# Patient Record
Sex: Female | Born: 1980 | Race: White | Hispanic: No | Marital: Married | Smoking: Never smoker
Health system: Southern US, Community
[De-identification: ages and names within clinical notes are randomized; demographics above are authoritative.]

## PROBLEM LIST (undated history)

## (undated) ENCOUNTER — Inpatient Hospital Stay (HOSPITAL_COMMUNITY): Payer: Self-pay

## (undated) DIAGNOSIS — B999 Unspecified infectious disease: Secondary | ICD-10-CM

## (undated) DIAGNOSIS — Z8619 Personal history of other infectious and parasitic diseases: Secondary | ICD-10-CM

## (undated) DIAGNOSIS — T8859XA Other complications of anesthesia, initial encounter: Secondary | ICD-10-CM

## (undated) DIAGNOSIS — G43909 Migraine, unspecified, not intractable, without status migrainosus: Secondary | ICD-10-CM

## (undated) DIAGNOSIS — T4145XA Adverse effect of unspecified anesthetic, initial encounter: Secondary | ICD-10-CM

## (undated) DIAGNOSIS — B379 Candidiasis, unspecified: Secondary | ICD-10-CM

## (undated) HISTORY — DX: Personal history of other infectious and parasitic diseases: Z86.19

## (undated) HISTORY — PX: WISDOM TOOTH EXTRACTION: SHX21

## (undated) HISTORY — DX: Unspecified infectious disease: B99.9

## (undated) HISTORY — PX: INTRAUTERINE DEVICE INSERTION: SHX323

## (undated) HISTORY — DX: Other complications of anesthesia, initial encounter: T88.59XA

## (undated) HISTORY — PX: TONSILLECTOMY: SUR1361

## (undated) HISTORY — DX: Adverse effect of unspecified anesthetic, initial encounter: T41.45XA

## (undated) HISTORY — DX: Candidiasis, unspecified: B37.9

---

## 2011-12-13 ENCOUNTER — Encounter (HOSPITAL_COMMUNITY): Payer: Self-pay | Admitting: *Deleted

## 2011-12-13 ENCOUNTER — Inpatient Hospital Stay (HOSPITAL_COMMUNITY)
Admission: AD | Admit: 2011-12-13 | Discharge: 2011-12-13 | Disposition: A | Discharge status: Home / Self Care | Payer: Medicaid Other | Source: Ambulatory Visit | Attending: Obstetrics & Gynecology | Admitting: Obstetrics & Gynecology

## 2011-12-13 DIAGNOSIS — M549 Dorsalgia, unspecified: Secondary | ICD-10-CM

## 2011-12-13 DIAGNOSIS — O26899 Other specified pregnancy related conditions, unspecified trimester: Secondary | ICD-10-CM

## 2011-12-13 DIAGNOSIS — M5380 Other specified dorsopathies, site unspecified: Secondary | ICD-10-CM | POA: Insufficient documentation

## 2011-12-13 DIAGNOSIS — O99891 Other specified diseases and conditions complicating pregnancy: Secondary | ICD-10-CM | POA: Insufficient documentation

## 2011-12-13 HISTORY — DX: Migraine, unspecified, not intractable, without status migrainosus: G43.909

## 2011-12-13 LAB — URINE MICROSCOPIC-ADD ON

## 2011-12-13 LAB — URINALYSIS, ROUTINE W REFLEX MICROSCOPIC
Bilirubin Urine: NEGATIVE
Glucose, UA: NEGATIVE mg/dL
Ketones, ur: NEGATIVE mg/dL
Nitrite: NEGATIVE
Specific Gravity, Urine: 1.025 (ref 1.005–1.030)
pH: 6.5 (ref 5.0–8.0)

## 2011-12-13 MED ORDER — ACETAMINOPHEN 500 MG PO TABS
1000.0000 mg | ORAL_TABLET | Freq: Three times a day (TID) | ORAL | Status: DC | PRN
  Administered 2011-12-13: 1000 mg via ORAL
  Filled 2011-12-13: qty 2

## 2011-12-13 MED ORDER — CYCLOBENZAPRINE HCL 10 MG PO TABS
10.0000 mg | ORAL_TABLET | Freq: Once | ORAL | Status: AC
  Administered 2011-12-13: 10 mg via ORAL
  Filled 2011-12-13: qty 1

## 2011-12-13 MED ORDER — CYCLOBENZAPRINE HCL 10 MG PO TABS: 10.0000 mg | ORAL_TABLET | Freq: Three times a day (TID) | ORAL | Status: AC | PRN

## 2011-12-13 NOTE — MAU Note (Signed)
I have severe back pain for a month. I get this with each pregnancy near the end. Went to [facility] and given pain med. And sent home. Has seen chiropractor for couple wks. Can't sleep and have 3 kids and can't take care of them. Called the office this am and was told would call. Office closed at 1400 and pt received call at 1330. Pt has tried everything to help back pain and upset that OB/GYN didn't offer to help her.

## 2011-12-13 NOTE — MAU Note (Signed)
Patient states that she have had lower back pain for 2 months and have been going to the chiropractor but have breakthrough intense pains where she can't walk or move. She states that she went to [facility] last week and was given 2 tablets of percocet but her doctor will not give her any pain medication or muscle relaxer. She denies any vaginal bleeding, lof or discharge. She reports good fetal movement. She states that the pregnancy belt isn't working for her. She states that she have 3 children and wants to able to care for them.

## 2011-12-13 NOTE — MAU Provider Note (Signed)
  History     CSN: [id number]  Arrival date and time: 12/13/11 2043   First Provider Initiated Contact with Patient 12/13/11 2141      Chief Complaint  Patient presents with  . Back Pain   HPI This is a 31 yo [id number] at 32.3 weeks who receives her care with Dr Shawnie Pons in [facility] who presents with 2 months of severe nonradiating constant back pain.  Has been worse over past several days.  Sees chiropractor over past 2 weeks, which has helped some.  Tylenol not helpful.  Movement makes pain worse.  Denies fevers, chills, nausea, vomiting, discharge, bleeding, contractions, decreased fetal movement.  OB History    Grav Para Term Preterm Abortions TAB SAB Ect Mult Living   8    4  4   3       Past Medical History  Diagnosis Date  . Migraine     Past Surgical History  Procedure Date  . Cesarean section     History reviewed. No pertinent family history.  History  Substance Use Topics  . Smoking status: Not on file  . Smokeless tobacco: Not on file  . Alcohol Use:     Allergies:  Allergies  Allergen Reactions  . Amoxicillin Hives and Itching  . Erythromycin Hives and Itching  . Sulfa Antibiotics Swelling and Rash    Prescriptions prior to admission  Medication Sig Dispense Refill  . calcium carbonate (TUMS - DOSED IN MG ELEMENTAL CALCIUM) 500 MG chewable tablet Chew 1-2 tablets by mouth daily as needed. For heartburn      . Docosahexaenoic Acid (PRENATAL DHA PO) Take 1 tablet by mouth at bedtime.        ROS Physical Exam   Blood pressure 116/60, pulse 100, temperature 98.5 F (36.9 C), temperature source Oral, resp. rate 20, height 5\' 2"  (1.575 m), weight 90.447 kg (199 lb 6.4 oz).  Physical Exam  Constitutional: She is oriented to person, place, and time. She appears well-developed and well-nourished.  GI: Soft. She exhibits no distension and no mass. There is no tenderness. There is no rebound and no guarding.  Musculoskeletal: Normal range of motion. She  exhibits tenderness (worse left lower back.  Hypertonic lumbar paraspinals and QLs.).  Neurological: She is alert and oriented to person, place, and time.  Skin: Skin is warm and dry.  Psychiatric: She has a normal mood and affect. Her behavior is normal. Judgment and thought content normal.    MAU Course  Procedures   Assessment and Plan  1.  Back Spasm in pregnancy  Will prescribe flexeril.  Encouraged continued chiropractic care, tylenol, heat/ice, exercises.  Pt to follow up at next scheduled appt.  Jayin Derousse JEHIEL 12/13/2011, 10:08 PM

## 2011-12-26 ENCOUNTER — Ambulatory Visit (INDEPENDENT_AMBULATORY_CARE_PROVIDER_SITE_OTHER): Payer: Medicaid Other | Admitting: Obstetrics and Gynecology

## 2011-12-26 DIAGNOSIS — Z331 Pregnant state, incidental: Secondary | ICD-10-CM

## 2011-12-26 NOTE — Progress Notes (Signed)
Pt transferring without records. Has appt with Dr Shawnie Pons this PM.  ROI signed. Pt will request records at visit.  States has had labs and U/S. Has not been screened for toxoplasmosis that she knows.   Will await records.  Also ROI signed for C/S reports from [place]. Pt desires VBAC.  To discuss at [place].

## 2011-12-30 ENCOUNTER — Encounter: Payer: Self-pay | Admitting: Obstetrics and Gynecology

## 2012-01-01 ENCOUNTER — Encounter: Payer: Self-pay | Admitting: Obstetrics and Gynecology

## 2014-08-18 ENCOUNTER — Inpatient Hospital Stay (HOSPITAL_COMMUNITY): Admit: 2014-08-18 | Payer: Self-pay

## 2014-08-18 ENCOUNTER — Encounter (HOSPITAL_COMMUNITY): Payer: Self-pay | Admitting: *Deleted

## 2014-08-18 ENCOUNTER — Inpatient Hospital Stay (HOSPITAL_COMMUNITY): Payer: Medicaid Other

## 2014-08-18 ENCOUNTER — Inpatient Hospital Stay (HOSPITAL_COMMUNITY)
Admission: AD | Admit: 2014-08-18 | Discharge: 2014-08-18 | Disposition: A | Discharge status: Home / Self Care | Payer: Self-pay | Source: Ambulatory Visit | Attending: Obstetrics & Gynecology | Admitting: Obstetrics & Gynecology

## 2014-08-18 DIAGNOSIS — Z3A00 Weeks of gestation of pregnancy not specified: Secondary | ICD-10-CM | POA: Insufficient documentation

## 2014-08-18 DIAGNOSIS — R109 Unspecified abdominal pain: Secondary | ICD-10-CM

## 2014-08-18 DIAGNOSIS — O26899 Other specified pregnancy related conditions, unspecified trimester: Secondary | ICD-10-CM

## 2014-08-18 DIAGNOSIS — O219 Vomiting of pregnancy, unspecified: Secondary | ICD-10-CM | POA: Insufficient documentation

## 2014-08-18 LAB — ABO/RH: ABO/RH(D): A POS

## 2014-08-18 LAB — CBC
HEMATOCRIT: 38.3 % (ref 36.0–46.0)
HEMOGLOBIN: 13 g/dL (ref 12.0–15.0)
MCH: 29.4 pg (ref 26.0–34.0)
MCHC: 33.9 g/dL (ref 30.0–36.0)
MCV: 86.7 fL (ref 78.0–100.0)
Platelets: 267 10*3/uL (ref 150–400)
RBC: 4.42 MIL/uL (ref 3.87–5.11)
RDW: 13.2 % (ref 11.5–15.5)
WBC: 9.5 10*3/uL (ref 4.0–10.5)

## 2014-08-18 LAB — URINALYSIS, ROUTINE W REFLEX MICROSCOPIC
Bilirubin Urine: NEGATIVE
GLUCOSE, UA: NEGATIVE mg/dL
Ketones, ur: 15 mg/dL — AB
Leukocytes, UA: NEGATIVE
NITRITE: NEGATIVE
PH: 7 (ref 5.0–8.0)
Protein, ur: NEGATIVE mg/dL
SPECIFIC GRAVITY, URINE: 1.01 (ref 1.005–1.030)
Urobilinogen, UA: 0.2 mg/dL (ref 0.0–1.0)

## 2014-08-18 LAB — COMPREHENSIVE METABOLIC PANEL
ALT: 27 U/L (ref 0–35)
AST: 20 U/L (ref 0–37)
Albumin: 4.3 g/dL (ref 3.5–5.2)
Alkaline Phosphatase: 44 U/L (ref 39–117)
Anion gap: 8 (ref 5–15)
BUN: 10 mg/dL (ref 6–23)
CALCIUM: 8.6 mg/dL (ref 8.4–10.5)
CO2: 24 mmol/L (ref 19–32)
Chloride: 106 mmol/L (ref 96–112)
Creatinine, Ser: 0.72 mg/dL (ref 0.50–1.10)
GFR calc Af Amer: >90 mL/min (ref >90)
GLUCOSE: 149 mg/dL — AB (ref 70–99)
Potassium: 3.9 mmol/L (ref 3.5–5.1)
Sodium: 138 mmol/L (ref 135–145)
Total Bilirubin: 0.6 mg/dL (ref 0.3–1.2)
Total Protein: 6.9 g/dL (ref 6.0–8.3)

## 2014-08-18 LAB — POCT PREGNANCY, URINE: Preg Test, Ur: POSITIVE — AB

## 2014-08-18 LAB — URINE MICROSCOPIC-ADD ON

## 2014-08-18 LAB — WET PREP, GENITAL
Clue Cells Wet Prep HPF POC: NONE SEEN
Trich, Wet Prep: NONE SEEN
WBC, Wet Prep HPF POC: NONE SEEN
YEAST WET PREP: NONE SEEN

## 2014-08-18 LAB — HCG, QUANTITATIVE, PREGNANCY: hCG, Beta Chain, Quant, S: 10343 m[IU]/mL — ABNORMAL HIGH (ref <5)

## 2014-08-18 MED ORDER — DEXTROSE 5 % IN LACTATED RINGERS IV BOLUS
1000.0000 mL | Freq: Once | INTRAVENOUS | Status: AC
  Administered 2014-08-18: 1000 mL via INTRAVENOUS

## 2014-08-18 MED ORDER — PROMETHAZINE HCL 25 MG PO TABS: 25.0000 mg | ORAL_TABLET | Freq: Four times a day (QID) | ORAL | Status: DC | PRN

## 2014-08-18 MED ORDER — PROMETHAZINE HCL 25 MG/ML IJ SOLN
25.0000 mg | Freq: Once | INTRAMUSCULAR | Status: AC
  Administered 2014-08-18: 25 mg via INTRAVENOUS
  Filled 2014-08-18: qty 1

## 2014-08-18 NOTE — MAU Provider Note (Signed)
History     CSN: [id number]  Arrival date and time: 08/18/14 1359   First Provider Initiated Contact with Patient 08/18/14 1448      Chief Complaint  Patient presents with  . Emesis During Pregnancy   HPI Pt is pregnant with unknown GA due to amenorrhea for 1 year after SAB.  Pt has been vomiting for 3 days and unable to  Keep anything down.  Pt denies fever, headache, diarrhea or abdominal pain. Pt denies spotting or bleeding Pt plans to go to [place] for prenatal care.   RN note 1st OB appt on 4/11 @ [place]. Vomiting for the last 3 days, unable to keep fluids down. Has been taking Vitamin B6 but is throwing it up. Denies diarrhea. Has HA, no abd pain.        [facility] appointment); Past Medical History  Diagnosis Date  . Complication of anesthesia     ITCHING  . Infection     UTI WITH PREGNANCY  . Migraine     MIGRAINES  . Yeast infection   . H/O varicella     Past Surgical History  Procedure Laterality Date  . Cesarean section    . Tonsillectomy  AGE 34  . Wisdom tooth extraction  AGE 34    Family History  Problem Relation Age of Onset  . Rheum arthritis Father   . Birth defects Son     HAS ONLY ONE KIDNEY  . Miscarriages / Stillbirths Maternal Grandmother   . Parkinsonism Maternal Grandmother     History  Substance Use Topics  . Smoking status: Never Smoker   . Smokeless tobacco: Never Used  . Alcohol Use: No    Allergies:  Allergies  Allergen Reactions  . Sulfa Antibiotics Swelling and Rash  . Amoxicillin Hives and Itching  . Erythromycin Hives and Itching    Prescriptions prior to admission  Medication Sig Dispense Refill Last Dose  . Docosahexaenoic Acid (PRENATAL DHA PO) Take 1 tablet by mouth at bedtime.   08/17/2014 at Unknown time    Review of Systems  Constitutional: Negative for fever and chills.  Gastrointestinal: Positive for nausea and vomiting. Negative for  heartburn.  Genitourinary: Negative for dysuria, urgency and frequency.  Musculoskeletal: Negative for back pain.  Neurological: Negative for headaches.   Physical Exam   Blood pressure 132/77, pulse 88, temperature 98.4 F (36.9 C), temperature source Oral, resp. rate 18, height  (1.575 m), weight 207 lb 3.2 oz (93.985 kg).  Physical Exam  Nursing note and vitals reviewed. Constitutional: She is oriented to person, place, and time. She appears well-developed and well-nourished. No distress.  HENT:  Head: Normocephalic.  Eyes: Pupils are equal, round, and reactive to light.  Neck: Normal range of motion. Neck supple.  Cardiovascular: Normal rate.   Respiratory: Effort normal.  GI: Soft.  Genitourinary: Vagina normal.  Musculoskeletal: Normal range of motion.  Neurological: She is alert and oriented to person, place, and time.  Skin: Skin is warm and dry.  Psychiatric: She has a normal mood and affect.    MAU Course  Procedures Results for orders placed or performed during the hospital encounter of 08/18/14 (from the past 48 hour(s))  Urinalysis, Routine w reflex microscopic     Status: Abnormal   Collection Time: 08/18/14  2:15 PM  Result Value Ref Range   Color, Urine YELLOW YELLOW   APPearance CLEAR CLEAR   Specific Gravity, Urine 1.010 1.005 -  1.030   pH 7.0 5.0 - 8.0   Glucose, UA NEGATIVE NEGATIVE mg/dL   Hgb urine dipstick TRACE (A) NEGATIVE   Bilirubin Urine NEGATIVE NEGATIVE   Ketones, ur 15 (A) NEGATIVE mg/dL   Protein, ur NEGATIVE NEGATIVE mg/dL   Urobilinogen, UA 0.2 0.0 - 1.0 mg/dL   Nitrite NEGATIVE NEGATIVE   Leukocytes, UA NEGATIVE NEGATIVE  Urine microscopic-add on     Status: None   Collection Time: 08/18/14  2:15 PM  Result Value Ref Range   Squamous Epithelial / LPF RARE RARE   RBC / HPF 0-2 <3 RBC/hpf  Pregnancy, urine POC     Status: Abnormal   Collection Time: 08/18/14  2:22 PM  Result Value Ref Range   Preg Test, Ur POSITIVE (A)  NEGATIVE    Comment:        THE SENSITIVITY OF THIS METHODOLOGY IS >24 mIU/mL   CBC     Status: None   Collection Time: 08/18/14  3:22 PM  Result Value Ref Range   WBC 9.5 4.0 - 10.5 K/uL   RBC 4.42 3.87 - 5.11 MIL/uL   Hemoglobin 13.0 12.0 - 15.0 g/dL   HCT 95.6 21.3 - 08.6 %   MCV 86.7 78.0 - 100.0 fL   MCH 29.4 26.0 - 34.0 pg   MCHC 33.9 30.0 - 36.0 g/dL   RDW 57.8 46.9 - 62.9 %   Platelets 267 150 - 400 K/uL  hCG, quantitative, pregnancy     Status: Abnormal   Collection Time: 08/18/14  3:22 PM  Result Value Ref Range   hCG, Beta Chain, Quant, S 10343 (H) <5 mIU/mL    Comment:          GEST. AGE      CONC.  (mIU/mL)   <=1 WEEK        5 - 50     2 WEEKS       50 - 500     3 WEEKS       100 - 10,000     4 WEEKS     1,000 - 30,000     5 WEEKS     3,500 - 115,000   6-8 WEEKS     12,000 - 270,000    12 WEEKS     15,000 - 220,000        FEMALE AND NON-PREGNANT FEMALE:     LESS THAN 5 mIU/mL   ABO/Rh     Status: None (Preliminary result)   Collection Time: 08/18/14  3:22 PM  Result Value Ref Range   ABO/RH(D) A POS   Wet prep, genital     Status: None   Collection Time: 08/18/14  3:58 PM  Result Value Ref Range   Yeast Wet Prep HPF POC NONE SEEN NONE SEEN   Trich, Wet Prep NONE SEEN NONE SEEN   Clue Cells Wet Prep HPF POC NONE SEEN NONE SEEN   WBC, Wet Prep HPF POC NONE SEEN NONE SEEN  D5LR 1000 and phenergan  IVP GC/chlamdyia pending [place] IUGS no YS, embryo or cardiac activity US Ob Comp Less 14 Wks  08/18/2014   CLINICAL DATA:  Abdominal pain. Spontaneous abortion February 2015, no menstrual periods since that time. Positive pregnancy test. Beta HCG 10,343.  EXAM: OBSTETRIC <14 WK [place] AND TRANSVAGINAL OB US  TECHNIQUE: Both transabdominal and transvaginal ultrasound examinations were performed for complete evaluation of the gestation as well as the maternal uterus, adnexal regions, and pelvic cul-de-sac. Transvaginal technique  was performed to assess early pregnancy.   COMPARISON:  None.  FINDINGS: Intrauterine gestational sac: Small gestational sac.  Yolk sac:  Not present.  Embryo:  Not present  Cardiac Activity: Not present.  MSD:  9 mm   5 w   5  d     US EDC: 04/15/2015  Maternal uterus/adnexae: No subchorionic hemorrhage. The left ovary measures 2.3 x 2.7 x 3.1 cm and contains a probable 2.2 cm corpus luteal cyst. The right ovary measures 3.3 x 2.2 x 2.6 cm. There is normal blood flow to both ovaries. No adnexal mass. Trace pelvic free fluid.  IMPRESSION: Probable early intrauterine gestational sac, but no yolk sac, fetal pole, or cardiac activity yet visualized. This is discordant based on beta HCG. Recommend follow-up quantitative B-HCG levels and follow-up US in 14 days to confirm and assess viability. This recommendation follows SRU consensus guidelines: Diagnostic Criteria for Nonviable Pregnancy Early in the First Trimester. Malva Limes Engl J Med 2013; 161:0960-45; 369:1443-51.   Electronically Signed   By: Rubye OaksMelanie  Ehinger M.D.   On: 08/18/2014 17:19   [place] Ob Transvaginal  08/18/2014   CLINICAL DATA:  Abdominal pain. Spontaneous abortion February 2015, no menstrual periods since that time. Positive pregnancy test. Beta HCG 10,343.  EXAM: OBSTETRIC <14 WK US AND TRANSVAGINAL OB US  TECHNIQUE: Both transabdominal and transvaginal ultrasound examinations were performed for complete evaluation of the gestation as well as the maternal uterus, adnexal regions, and pelvic cul-de-sac. Transvaginal technique was performed to assess early pregnancy.  COMPARISON:  None.  FINDINGS: Intrauterine gestational sac: Small gestational sac.  Yolk sac:  Not present.  Embryo:  Not present  Cardiac Activity: Not present.  MSD:  9 mm   5 w   5  d     US EDC: 04/15/2015  Maternal uterus/adnexae: No subchorionic hemorrhage. The left ovary measures 2.3 x 2.7 x 3.1 cm and contains a probable 2.2 cm corpus luteal cyst. The right ovary measures 3.3 x 2.2 x 2.6 cm. There is normal blood flow to both ovaries. No  adnexal mass. Trace pelvic free fluid.  IMPRESSION: Probable early intrauterine gestational sac, but no yolk sac, fetal pole, or cardiac activity yet visualized. This is discordant based on beta HCG. Recommend follow-up quantitative B-HCG levels and follow-up US in 14 days to confirm and assess viability. This recommendation follows SRU consensus guidelines: Diagnostic Criteria for Nonviable Pregnancy Early in the First Trimester. Malva Limes Engl J Med 2013; 409:8119-14; 369:1443-51.   Electronically Signed   By: Rubye OaksMelanie  Ehinger M.D.   On: 08/18/2014 17:19   Assessment and Plan  Nausea and vomiting in pregnancy Rx Phenergan IUGS, no YS- f/u 48 hours for repeat HCG Heyward Douthit 08/18/2014, 2:51 PM

## 2014-08-18 NOTE — MAU Note (Signed)
[facility] with [facility]/14(1st appointment);

## 2014-08-18 NOTE — MAU Note (Signed)
1st OB appt on 4/11 @ [place].  Vomiting for the last 3 days, unable to keep fluids down.  Has been taking Vitamin B6 but is throwing it up.  Denies diarrhea.  Has HA, no abd pain.

## 2014-08-19 LAB — GC/CHLAMYDIA PROBE AMP (~~LOC~~) NOT AT ARMC
CHLAMYDIA, DNA PROBE: NEGATIVE
Neisseria Gonorrhea: NEGATIVE

## 2014-08-20 ENCOUNTER — Encounter (HOSPITAL_COMMUNITY): Payer: Self-pay

## 2014-08-20 ENCOUNTER — Inpatient Hospital Stay (HOSPITAL_COMMUNITY)
Admission: AD | Admit: 2014-08-20 | Discharge: 2014-08-20 | Disposition: A | Discharge status: Home / Self Care | Payer: Self-pay | Source: Ambulatory Visit | Attending: Obstetrics & Gynecology | Admitting: Obstetrics & Gynecology

## 2014-08-20 DIAGNOSIS — O0281 Inappropriate change in quantitative human chorionic gonadotropin (hCG) in early pregnancy: Secondary | ICD-10-CM | POA: Insufficient documentation

## 2014-08-20 LAB — HCG, QUANTITATIVE, PREGNANCY: HCG, BETA CHAIN, QUANT, S: 16378 m[IU]/mL — AB (ref <5)

## 2014-08-20 NOTE — MAU Note (Signed)
Pt here for repeat BHCG. Denies pain or bleeding.  

## 2014-08-20 NOTE — MAU Provider Note (Signed)
History     CSN: [id number]  Arrival date and time: 08/20/14 9562  Seen by provider at 9:15 am    Chief Complaint  Patient presents with  . Repeat BHCG    HPI Ellen Short 34 y.o. [redacted]w[redacted]d  Comes today for repeat quant. Records and labs from visit on 08-18-14 reviewed.  Denies any pain or bleeding at this time.  Vomiting has not been a problem since the last visit and she thinks the vomiting is due to the pregnancy (not a GI illness) as she had vomiting with her other 4 pregnancies.  Has the prescription for Phenergan.  No other problems today.   OB History    Gravida Para Term Preterm AB TAB SAB Ectopic Multiple Living   Past Medical History  Diagnosis Date  . Complication of anesthesia     ITCHING  . Infection     UTI WITH PREGNANCY  . Migraine     MIGRAINES  . Yeast infection   . H/O varicella     Past Surgical History  Procedure Laterality Date  . Cesarean section    . Tonsillectomy  AGE 5  . Wisdom tooth extraction  AGE 10    Family History  Problem Relation Age of Onset  . Rheum arthritis Father   . Birth defects Son     HAS ONLY ONE KIDNEY  . Miscarriages / Stillbirths Maternal Grandmother   . Parkinsonism Maternal Grandmother     History  Substance Use Topics  . Smoking status: Never Smoker   . Smokeless tobacco: Never Used  . Alcohol Use: No    Allergies:  Allergies  Allergen Reactions  . Sulfa Antibiotics Swelling and Rash  . Amoxicillin Hives and Itching  . Erythromycin Hives and Itching    Prescriptions prior to admission  Medication Sig Dispense Refill Last Dose  . Docosahexaenoic Acid (PRENATAL DHA PO) Take 1 tablet by mouth at bedtime.   08/17/2014 at Unknown time  . promethazine (PHENERGAN) 25 MG tablet Take 1 tablet (25 mg total) by mouth every 6 (six) hours as needed for nausea or vomiting. 30 tablet 0     Review of Systems  Constitutional: Negative for fever.  Gastrointestinal: Positive for nausea and  vomiting. Negative for abdominal pain and diarrhea.  Genitourinary:       No vaginal discharge. No vaginal bleeding. No dysuria.   Physical Exam   Blood pressure 136/76, pulse 83, temperature 99.5 F (37.5 C), temperature source Oral, resp. rate 16, height  (1.575 m), weight 211 lb 8 oz (95.936 kg).  Physical Exam  Nursing note and vitals reviewed. Constitutional: She is oriented to person, place, and time. She appears well-developed and well-nourished.  HENT:  Head: Normocephalic.  Eyes: EOM are normal.  Neck: Neck supple.  Musculoskeletal: Normal range of motion.  Neurological: She is alert and oriented to person, place, and time.  Skin: Skin is warm and dry.  Psychiatric: She has a normal mood and affect.    MAU Course  Procedures Results for orders placed or performed during the hospital encounter of 08/20/14 (from the past 24 hour(s))  hCG, quantitative, pregnancy     Status: Abnormal   Collection Time: 08/20/14  7:36 AM  Result Value Ref Range   hCG, Beta Chain, Quant, S 16378 (H) <5 mIU/mL    MDM Quant was 10,000 on 08-18-14 and would have expected quant of approx.  20,000 today.  Consult with Dr. Macon LargeAnyanwu re: plan of care.  Further quants not needed.  Will order ultrasound to be done 08-25-14 to review status of pregnancy.  Discussed today's lab results with patient.  Advised that quants are rising but possibly not as well as expected.  Discussed possible miscarriage vs. possible normal pregnancy  - will have further information based on the results of the next ultrasound.  Client is tearful - this is a wanted pregnancy and she has had several miscarriages.  Assessment and Plan  Inappropriate rise in quant in early pregnancy  Plan Sent an order to ultrasound to schedule outpatient US on 08-25-14.   Return to MAU with any problems prior to that appointment.   BURLESON,TERRI 08/20/2014, 8:17 AM

## 2014-08-25 ENCOUNTER — Inpatient Hospital Stay (HOSPITAL_COMMUNITY)
Admission: AD | Admit: 2014-08-25 | Discharge: 2014-08-25 | Disposition: A | Discharge status: Home / Self Care | Payer: Self-pay | Source: Ambulatory Visit | Attending: Obstetrics & Gynecology | Admitting: Obstetrics & Gynecology

## 2014-08-25 ENCOUNTER — Ambulatory Visit (HOSPITAL_COMMUNITY)
Admission: RE | Admit: 2014-08-25 | Discharge: 2014-08-25 | Disposition: A | Discharge status: Home / Self Care | Payer: Medicaid Other | Source: Ambulatory Visit | Attending: Nurse Practitioner | Admitting: Nurse Practitioner

## 2014-08-25 DIAGNOSIS — O3680X0 Pregnancy with inconclusive fetal viability, not applicable or unspecified: Secondary | ICD-10-CM

## 2014-08-25 DIAGNOSIS — O0281 Inappropriate change in quantitative human chorionic gonadotropin (hCG) in early pregnancy: Secondary | ICD-10-CM

## 2014-08-25 NOTE — MAU Provider Note (Signed)
Pt is here for viability ultrasound- had repeat HCG at 42 hours and did not quite double Today denies pain or bleeding Pt still has nausea and has used phenergan Generally WDWN female in NAD [place] Ob Comp Less 14 Wks  08/25/2014   CLINICAL DATA:  Inappropriately rising beta HCG levels. First trimester pregnancy with uncertain fetal viability. Unsure of LMP. Advanced maternal age.  EXAM: OBSTETRIC <14 WK US AND TRANSVAGINAL OB US  TECHNIQUE: Both transabdominal and transvaginal ultrasound examinations were performed for complete evaluation of the gestation as well as the maternal uterus, adnexal regions, and pelvic cul-de-sac. Transvaginal technique was performed to assess early pregnancy.  COMPARISON:  08/18/2014  FINDINGS: Intrauterine gestational sac: Visualized/normal in shape.  Yolk sac:  Visualized  Embryo:  Visualized  Cardiac Activity: Visualized  Heart Rate: 122  bpm  CRL:  5  mm   6 w   1 d                  US EDC: 04/16/2015  Maternal uterus/adnexae: Small left ovarian corpus luteum cyst noted. Normal appearance of right ovary. No adnexal mass or free fluid identified.  IMPRESSION: Single living IUP measuring 6 weeks 1 day with US EDC of 04/16/2015.  No significant maternal uterine or adnexal abnormality identified.   Electronically Signed   By: Myles RosenthalJohn  Stahl M.D.   On: 08/25/2014 15:31   [place] Ob Transvaginal  08/25/2014   CLINICAL DATA:  Inappropriately rising beta HCG levels. First trimester pregnancy with uncertain fetal viability. Unsure of LMP. Advanced maternal age.  EXAM: OBSTETRIC <14 WK US AND TRANSVAGINAL OB US  TECHNIQUE: Both transabdominal and transvaginal ultrasound examinations were performed for complete evaluation of the gestation as well as the maternal uterus, adnexal regions, and pelvic cul-de-sac. Transvaginal technique was performed to assess early pregnancy.  COMPARISON:  08/18/2014  FINDINGS: Intrauterine gestational sac: Visualized/normal in shape.  Yolk sac:  Visualized  Embryo:   Visualized  Cardiac Activity: Visualized  Heart Rate: 122  bpm  CRL:  5  mm   6 w   1 d                  US EDC: 04/16/2015  Maternal uterus/adnexae: Small left ovarian corpus luteum cyst noted. Normal appearance of right ovary. No adnexal mass or free fluid identified.  IMPRESSION: Single living IUP measuring 6 weeks 1 day with US EDC of 04/16/2015.  No significant maternal uterine or adnexal abnormality identified.   Electronically Signed   By: Myles RosenthalJohn  Stahl M.D.   On: 08/25/2014 15:31  IMP: SLIUP 6154w1d by US and 4669w5d by LMP Nausea and vomiting- reviewed diet and meds [facility], [facility]

## 2014-08-29 ENCOUNTER — Ambulatory Visit (INDEPENDENT_AMBULATORY_CARE_PROVIDER_SITE_OTHER): Payer: Self-pay

## 2014-08-29 ENCOUNTER — Encounter: Payer: Self-pay | Admitting: Advanced Practice Midwife

## 2014-08-29 ENCOUNTER — Ambulatory Visit (INDEPENDENT_AMBULATORY_CARE_PROVIDER_SITE_OTHER): Payer: Self-pay | Admitting: Advanced Practice Midwife

## 2014-08-29 VITALS — BP 121/80 | HR 88 | Wt 210.0 lb

## 2014-08-29 DIAGNOSIS — Z3A01 Less than 8 weeks gestation of pregnancy: Secondary | ICD-10-CM

## 2014-08-29 DIAGNOSIS — O09523 Supervision of elderly multigravida, third trimester: Secondary | ICD-10-CM

## 2014-08-29 DIAGNOSIS — O26892 Other specified pregnancy related conditions, second trimester: Secondary | ICD-10-CM

## 2014-08-29 DIAGNOSIS — Z3491 Encounter for supervision of normal pregnancy, unspecified, first trimester: Secondary | ICD-10-CM

## 2014-08-29 DIAGNOSIS — Z8759 Personal history of other complications of pregnancy, childbirth and the puerperium: Secondary | ICD-10-CM

## 2014-08-29 DIAGNOSIS — Z3201 Encounter for pregnancy test, result positive: Secondary | ICD-10-CM

## 2014-08-29 DIAGNOSIS — O09521 Supervision of elderly multigravida, first trimester: Secondary | ICD-10-CM

## 2014-08-29 DIAGNOSIS — Z3490 Encounter for supervision of normal pregnancy, unspecified, unspecified trimester: Secondary | ICD-10-CM | POA: Insufficient documentation

## 2014-08-29 DIAGNOSIS — Z3481 Encounter for supervision of other normal pregnancy, first trimester: Secondary | ICD-10-CM

## 2014-08-29 MED ORDER — CONCEPT DHA 53.5-38-1 MG PO CAPS: 1.0000 | ORAL_CAPSULE | Freq: Every day | ORAL | Status: DC

## 2014-08-29 NOTE — Progress Notes (Signed)
Spoke to US tech and cardiac activity was visualized with FHR 117. US ordered STAT. Will wait to confirm once radiologist reviews.

## 2014-08-29 NOTE — Progress Notes (Signed)
Here for NOB visit. US in MAU 08/25/14 showed 6.1 week SIUP FHR 122. Unable to see baby today w/ certainty, possibly due to equipment and body habitus. Sent for formal US.   Formal ultrasound shows 6 week 5 day 11 intrauterine pregnancy. Fetal heart rate 117. Nervous because of history of miscarriage. Patient plans to breast-feed.  Last Pap 2015. Normal.   ROS: Negative for nausea, vomiting, vaginal bleeding, abdominal pain.  Physical Examination: General appearance - alert, well appearing, and in no distress and overweight Mouth - mucous membranes moist, pharynx normal without lesions and dental hygiene good Neck - supple, no significant adenopathy, thyroid exam: thyroid is normal in size without nodules or tenderness Heart - normal rate, regular rhythm, normal S1, S2, no murmurs, rubs, clicks or gallops Abdomen - soft, nontender, nondistended, no masses or organomegaly Neurological - alert, oriented, normal speech, no focal findings or movement disorder noted Pelvic: Deferred due to recent exam.   1. Positive blood pregnancy test   - US OB Comp Less 14 Wks; Future - US OB Transvaginal; Future  2. Supervision of normal pregnancy in first trimester   - AMB referral to maternal fetal medicine  3. Advanced maternal age in multigravida, third trimester   - AMB referral to maternal fetal medicine  Denies genetic counseling and NIPS. Follow up in 4 weeks. Will need OB panel at next visit First trimester precautions.

## 2014-08-29 NOTE — Patient Instructions (Signed)
First Trimester of Pregnancy The first trimester of pregnancy is from week 1 until the end of week 12 (months 1 through 3). A week after a sperm fertilizes an egg, the egg will implant on the wall of the uterus. This embryo will begin to develop into a baby. Genes from you and your partner are forming the baby. The female genes determine whether the baby is a boy or a girl. At 6-8 weeks, the eyes and face are formed, and the heartbeat can be seen on ultrasound. At the end of 12 weeks, all the baby's organs are formed.  Now that you are pregnant, you will want to do everything you can to have a healthy baby. Two of the most important things are to get good prenatal care and to follow your health care provider's instructions. Prenatal care is all the medical care you receive before the baby's birth. This care will help prevent, find, and treat any problems during the pregnancy and childbirth. BODY CHANGES Your body goes through many changes during pregnancy. The changes vary from woman to woman.   You may gain or lose a couple of pounds at first.  You may feel sick to your stomach (nauseous) and throw up (vomit). If the vomiting is uncontrollable, call your health care provider.  You may tire easily.  You may develop headaches that can be relieved by medicines approved by your health care provider.  You may urinate more often. Painful urination may mean you have a bladder infection.  You may develop heartburn as a result of your pregnancy.  You may develop constipation because certain hormones are causing the muscles that push waste through your intestines to slow down.  You may develop hemorrhoids or swollen, bulging veins (varicose veins).  Your breasts may begin to grow larger and become tender. Your nipples may stick out more, and the tissue that surrounds them (areola) may become darker.  Your gums may bleed and may be sensitive to brushing and flossing.  Dark spots or blotches (chloasma,  mask of pregnancy) may develop on your face. This will likely fade after the baby is born.  Your menstrual periods will stop.  You may have a loss of appetite.  You may develop cravings for certain kinds of food.  You may have changes in your emotions from day to day, such as being excited to be pregnant or being concerned that something may go wrong with the pregnancy and baby.  You may have more vivid and strange dreams.  You may have changes in your hair. These can include thickening of your hair, rapid growth, and changes in texture. Some women also have hair loss during or after pregnancy, or hair that feels dry or thin. Your hair will most likely return to normal after your baby is born. WHAT TO EXPECT AT YOUR PRENATAL VISITS During a routine prenatal visit:  You will be weighed to make sure you and the baby are growing normally.  Your blood pressure will be taken.  Your abdomen will be measured to track your baby's growth.  The fetal heartbeat will be listened to starting around week 10 or 12 of your pregnancy.  Test results from any previous visits will be discussed. Your health care provider may ask you:  How you are feeling.  If you are feeling the baby move.  If you have had any abnormal symptoms, such as leaking fluid, bleeding, severe headaches, or abdominal cramping.  If you have any questions. Other tests   that may be performed during your first trimester include:  Blood tests to find your blood type and to check for the presence of any previous infections. They will also be used to check for low iron levels (anemia) and Rh antibodies. Later in the pregnancy, blood tests for diabetes will be done along with other tests if problems develop.  Urine tests to check for infections, diabetes, or protein in the urine.  An ultrasound to confirm the proper growth and development of the baby.  An amniocentesis to check for possible genetic problems.  Fetal screens for  spina bifida and Down syndrome.  You may need other tests to make sure you and the baby are doing well. HOME CARE INSTRUCTIONS  Medicines  Follow your health care provider's instructions regarding medicine use. Specific medicines may be either safe or unsafe to take during pregnancy.  Take your prenatal vitamins as directed.  If you develop constipation, try taking a stool softener if your health care provider approves. Diet  Eat regular, well-balanced meals. Choose a variety of foods, such as meat or vegetable-based protein, fish, milk and low-fat dairy products, vegetables, fruits, and whole grain breads and cereals. Your health care provider will help you determine the amount of weight gain that is right for you.  Avoid raw meat and uncooked cheese. These carry germs that can cause birth defects in the baby.  Eating four or five small meals rather than three large meals a day may help relieve nausea and vomiting. If you start to feel nauseous, eating a few soda crackers can be helpful. Drinking liquids between meals instead of during meals also seems to help nausea and vomiting.  If you develop constipation, eat more high-fiber foods, such as fresh vegetables or fruit and whole grains. Drink enough fluids to keep your urine clear or pale yellow. Activity and Exercise  Exercise only as directed by your health care provider. Exercising will help you:  Control your weight.  Stay in shape.  Be prepared for labor and delivery.  Experiencing pain or cramping in the lower abdomen or low back is a good sign that you should stop exercising. Check with your health care provider before continuing normal exercises.  Try to avoid standing for long periods of time. Move your legs often if you must stand in one place for a long time.  Avoid heavy lifting.  Wear low-heeled shoes, and practice good posture.  You may continue to have sex unless your health care provider directs you  otherwise. Relief of Pain or Discomfort  Wear a good support bra for breast tenderness.   Take warm sitz baths to soothe any pain or discomfort caused by hemorrhoids. Use hemorrhoid cream if your health care provider approves.   Rest with your legs elevated if you have leg cramps or low back pain.  If you develop varicose veins in your legs, wear support hose. Elevate your feet for 15 minutes, 3-4 times a day. Limit salt in your diet. Prenatal Care  Schedule your prenatal visits by the twelfth week of pregnancy. They are usually scheduled monthly at first, then more often in the last 2 months before delivery.  Write down your questions. Take them to your prenatal visits.  Keep all your prenatal visits as directed by your health care provider. Safety  Wear your seat belt at all times when driving.  Make a list of emergency phone numbers, including numbers for family, friends, the hospital, and police and fire departments. General Tips    Ask your health care provider for a referral to a local prenatal education class. Begin classes no later than at the beginning of month 6 of your pregnancy.  Ask for help if you have counseling or nutritional needs during pregnancy. Your health care provider can offer advice or refer you to specialists for help with various needs.  Do not use hot tubs, steam rooms, or saunas.  Do not douche or use tampons or scented sanitary pads.  Do not cross your legs for long periods of time.  Avoid cat litter boxes and soil used by cats. These carry germs that can cause birth defects in the baby and possibly loss of the fetus by miscarriage or stillbirth.  Avoid all smoking, herbs, alcohol, and medicines not prescribed by your health care provider. Chemicals in these affect the formation and growth of the baby.  Schedule a dentist appointment. At home, brush your teeth with a soft toothbrush and be gentle when you floss. SEEK MEDICAL CARE IF:   You have  dizziness.  You have mild pelvic cramps, pelvic pressure, or nagging pain in the abdominal area.  You have persistent nausea, vomiting, or diarrhea.  You have a bad smelling vaginal discharge.  You have pain with urination.  You notice increased swelling in your face, hands, legs, or ankles. SEEK IMMEDIATE MEDICAL CARE IF:   You have a fever.  You are leaking fluid from your vagina.  You have spotting or bleeding from your vagina.  You have severe abdominal cramping or pain.  You have rapid weight gain or loss.  You vomit blood or material that looks like coffee grounds.  You are exposed to German measles and have never had them.  You are exposed to fifth disease or chickenpox.  You develop a severe headache.  You have shortness of breath.  You have any kind of trauma, such as from a fall or a car accident. Document Released: 04/30/2001 Document Revised: 09/20/2013 Document Reviewed: 03/16/2013 ExitCare Patient Information 2015 [employer]. This information is not intended to replace advice given to you by your health care provider. Make sure you discuss any questions you have with your health care provider.  

## 2014-09-02 ENCOUNTER — Inpatient Hospital Stay (HOSPITAL_COMMUNITY)
Admission: AD | Admit: 2014-09-02 | Discharge: 2014-09-03 | Disposition: A | Discharge status: Home / Self Care | Payer: Medicaid Other | Source: Ambulatory Visit | Attending: Obstetrics & Gynecology | Admitting: Obstetrics & Gynecology

## 2014-09-02 DIAGNOSIS — O209 Hemorrhage in early pregnancy, unspecified: Secondary | ICD-10-CM

## 2014-09-02 DIAGNOSIS — Z3A01 Less than 8 weeks gestation of pregnancy: Secondary | ICD-10-CM | POA: Insufficient documentation

## 2014-09-02 DIAGNOSIS — R109 Unspecified abdominal pain: Secondary | ICD-10-CM | POA: Insufficient documentation

## 2014-09-02 DIAGNOSIS — O4691 Antepartum hemorrhage, unspecified, first trimester: Secondary | ICD-10-CM

## 2014-09-02 DIAGNOSIS — N898 Other specified noninflammatory disorders of vagina: Secondary | ICD-10-CM | POA: Insufficient documentation

## 2014-09-02 NOTE — MAU Note (Signed)
About 2200 saw brown spotting when went to [facility]. Having mild abd cramping.

## 2014-09-02 NOTE — MAU Provider Note (Signed)
History     CSN: [id number]  Arrival date and time: 09/02/14 2336   None     Chief Complaint  Patient presents with  . Abdominal Cramping  . Vaginal Bleeding   HPI   Ellen Short is a 34 y.o. female [phone] at [redacted]w[redacted]d who presents with brown vaginal discharge that she first noticed today after using the bathroom. She is not having active bleeding currently, however does noticed brown discharge when she wipes. The discharge is mucus like and small amount. This is the first time she has bled in this pregnancy. She is also having mild abdominal cramping; she rates it 1/10. No recent intercourse, however had an US done on 4/11 which included a vaginal probe that she said was "a difficult [place] because of her cervix".   She has a history of 4 SAB's.   OB History    Gravida Para Term Preterm AB TAB SAB Ectopic Multiple Living   0 4 0 4 0 0 4      Past Medical History  Diagnosis Date  . Complication of anesthesia     ITCHING  . Infection     UTI WITH PREGNANCY  . Migraine     MIGRAINES  . Yeast infection   . H/O varicella     Past Surgical History  Procedure Laterality Date  . Cesarean section    . Tonsillectomy  AGE 30  . Wisdom tooth extraction  AGE 61    Family History  Problem Relation Age of Onset  . Rheum arthritis Father   . Birth defects Son     HAS ONLY ONE KIDNEY  . Miscarriages / Stillbirths Maternal Grandmother   . Parkinsonism Maternal Grandmother     History  Substance Use Topics  . Smoking status: Never Smoker   . Smokeless tobacco: Never Used  . Alcohol Use: No    Allergies:  Allergies  Allergen Reactions  . Sulfa Antibiotics Swelling and Rash  . Amoxicillin Hives and Itching  . Erythromycin Hives and Itching    Prescriptions prior to admission  Medication Sig Dispense Refill Last Dose  . Docosahexaenoic Acid (PRENATAL DHA PO) Take 1 tablet by mouth at bedtime.   Taking  . Prenat-FeFum-FePo-FA-Omega 3 (CONCEPT DHA) 53.5-38-1  MG CAPS Take 1 tablet by mouth daily. 30 capsule 12   . promethazine (PHENERGAN) 25 MG tablet Take 1 tablet (25 mg total) by mouth every 6 (six) hours as needed for nausea or vomiting. 30 tablet 0 Taking   Results for orders placed or performed during the hospital encounter of 09/02/14 (from the past 48 hour(s))  Urinalysis, Routine w reflex microscopic     Status: Abnormal   Collection Time: 09/02/14 11:50 PM  Result Value Ref Range   Color, Urine YELLOW YELLOW   APPearance CLEAR CLEAR   Specific Gravity, Urine >1.030 (H) 1.005 - 1.030   pH 5.5 5.0 - 8.0   Glucose, UA NEGATIVE NEGATIVE mg/dL   Hgb urine dipstick TRACE (A) NEGATIVE   Bilirubin Urine NEGATIVE NEGATIVE   Ketones, ur NEGATIVE NEGATIVE mg/dL   Protein, ur NEGATIVE NEGATIVE mg/dL   Urobilinogen, UA 0.2 0.0 - 1.0 mg/dL   Nitrite NEGATIVE NEGATIVE   Leukocytes, UA NEGATIVE NEGATIVE  Urine microscopic-add on     Status: Abnormal   Collection Time: 09/02/14 11:50 PM  Result Value Ref Range   Squamous Epithelial / LPF RARE RARE   WBC, UA  <3 WBC/hpf    NO FORMED  ELEMENTS SEEN ON URINE MICROSCOPIC EXAMINATION   RBC / HPF 0-2 <3 RBC/hpf   Bacteria, UA FEW (A) RARE    [place] Ob Transvaginal  09/03/2014   CLINICAL DATA:  Vaginal bleeding and first-trimester pregnancy.  EXAM: OBSTETRIC <14 WK US AND TRANSVAGINAL OB US  TECHNIQUE: Both transabdominal and transvaginal ultrasound examinations were performed for complete evaluation of the gestation as well as the maternal uterus, adnexal regions, and pelvic cul-de-sac. Transvaginal technique was performed to assess early pregnancy.  COMPARISON:  08/29/2014  FINDINGS: Intrauterine gestational sac: Present  Yolk sac:  Present  Embryo:  Present  Cardiac Activity: Present  Heart Rate: 134  bpm  CRL:  14.8  mm   7 w   6 d                  US EDC: 04/16/2015  Maternal uterus/adnexae: Probable corpus luteum on the left. The right ovary is unremarkable. No significant pelvic fluid. When accounting  for vessels, there is no definitive subchorionic hematoma. Small foci associated with the uterine mucosa or submucosa were also noted 08/25/2014 and may be sequela of previous pregnancy or inflammation. Regardless, this finding is of doubtful clinical significance.  IMPRESSION: Single living intrauterine gestation.  Stable exam since 08/29/14.   Electronically Signed   By: Marnee SpringJonathon  Watts M.D.   On: 09/03/2014 01:11    Review of Systems  Constitutional: Negative for fever and chills.  Gastrointestinal: Positive for abdominal pain.  Genitourinary:       Brown vaginal discharge.    Physical Exam   Blood pressure 131/70, pulse 89, temperature 97.8 F (36.6 C), resp. rate 18, height 5\' 2"  (1.575 m), weight 96.253 kg (212 lb 3.2 oz).  Physical Exam  Constitutional: She is oriented to person, place, and time. She appears well-developed and well-nourished. No distress.  HENT:  Head: Normocephalic.  Respiratory: Effort normal.  GI: Soft. She exhibits no distension. There is no tenderness.  Genitourinary:  Cervix closed, posterior. Small amount of dark brown blood noted on exam glove.   Musculoskeletal: Normal range of motion.  Neurological: She is alert and oriented to person, place, and time.  Skin: Skin is warm. She is not diaphoretic.  Psychiatric: Her behavior is normal.    MAU Course  Procedures  None  MDM  A positive blood type  US   Assessment and Plan   A:  Vaginal bleeding in pregnancy SIUP @ 7w 6d with cardiac activity.    P:  Discharge home in stable condition Follow up with OB as scheduled Return to MAU if symptoms worsen  Pelvic rest Bleeding precautions.   Duane LopeJennifer I Rasch, NP 09/03/2014  12:02 AM

## 2014-09-03 ENCOUNTER — Inpatient Hospital Stay (HOSPITAL_COMMUNITY): Payer: Medicaid Other

## 2014-09-03 ENCOUNTER — Encounter (HOSPITAL_COMMUNITY): Payer: Self-pay | Admitting: *Deleted

## 2014-09-03 LAB — URINALYSIS, ROUTINE W REFLEX MICROSCOPIC
BILIRUBIN URINE: NEGATIVE
Glucose, UA: NEGATIVE mg/dL
Ketones, ur: NEGATIVE mg/dL
Leukocytes, UA: NEGATIVE
NITRITE: NEGATIVE
Protein, ur: NEGATIVE mg/dL
Specific Gravity, Urine: >1.03 — ABNORMAL HIGH (ref 1.005–1.030)
Urobilinogen, UA: 0.2 mg/dL (ref 0.0–1.0)
pH: 5.5 (ref 5.0–8.0)

## 2014-09-03 LAB — URINE MICROSCOPIC-ADD ON: WBC UA: NONE SEEN WBC/hpf (ref <3)

## 2014-09-03 NOTE — Discharge Instructions (Signed)
Vaginal Bleeding During Pregnancy, First Trimester °A small amount of bleeding (spotting) from the vagina is common in early pregnancy. Sometimes the bleeding is normal and is not a problem, and sometimes it is a sign of something serious. Be sure to tell your doctor about any bleeding from your vagina right away. °HOME CARE °· Watch your condition for any changes. °· Follow your doctor's instructions about how active you can be. °· If you are on bed rest: °· You may need to stay in bed and only get up to use the bathroom. °· You may be allowed to do some activities. °· If you need help, make plans for someone to help you. °· Write down: °· The number of pads you use each day. °· How often you change pads. °· How soaked (saturated) your pads are. °· Do not use tampons. °· Do not douche. °· Do not have sex or orgasms until your doctor says it is okay. °· If you pass any tissue from your vagina, save the tissue so you can show it to your doctor. °· Only take medicines as told by your doctor. °· Do not take aspirin because it can make you bleed. °· Keep all follow-up visits as told by your doctor. °GET HELP IF:  °· You bleed from your vagina. °· You have cramps. °· You have labor pains. °· You have a fever that does not go away after you take medicine. °GET HELP RIGHT AWAY IF:  °· You have very bad cramps in your back or belly (abdomen). °· You pass large clots or tissue from your vagina. °· You bleed more. °· You feel light-headed or weak. °· You pass out (faint). °· You have chills. °· You are leaking fluid or have a gush of fluid from your vagina. °· You pass out while pooping (having a bowel movement). °MAKE SURE YOU: °· Understand these instructions. °· Will watch your condition. °· Will get help right away if you are not doing well or get worse. °Document Released: 09/20/2013 Document Reviewed: 01/11/2013 °ExitCare® Patient Information ©2015 [employer]. This information is not intended to replace advice given  to you by your health care provider. Make sure you discuss any questions you have with your health care provider. °Pelvic Rest °Pelvic rest is sometimes recommended for women when:  °· The placenta is partially or completely covering the opening of the cervix (placenta previa). °· There is bleeding between the uterine wall and the amniotic sac in the first trimester (subchorionic hemorrhage). °· The cervix begins to open without labor starting (incompetent cervix, cervical insufficiency). °· The labor is too early (preterm labor). °HOME CARE INSTRUCTIONS °· Do not have sexual intercourse, stimulation, or an orgasm. °· Do not use tampons, douche, or put anything in the vagina. °· Do not lift anything over 10 pounds (4.5 kg). °· Avoid strenuous activity or straining your pelvic muscles. °SEEK MEDICAL CARE IF:  °· You have any vaginal bleeding during pregnancy. Treat this as a potential emergency. °· You have cramping pain felt low in the stomach (stronger than menstrual cramps). °· You notice vaginal discharge (watery, mucus, or bloody). °· You have a low, dull backache. °· There are regular contractions or uterine tightening. °SEEK IMMEDIATE MEDICAL CARE IF: °You have vaginal bleeding and have placenta previa.  °Document Released: 08/31/2010 Document Revised: 07/29/2011 Document Reviewed: 08/31/2010 °ExitCare® Patient Information ©2015 [employer]. This information is not intended to replace advice given to you by your health care provider. Make sure   sure you discuss any questions you have with your health care provider.

## 2014-09-03 NOTE — Progress Notes (Signed)
Azariah Rasch NP in to discuss test results and d/c plan. Written and verbal d/c instructions given and understanding voiced °

## 2014-09-06 DIAGNOSIS — O09529 Supervision of elderly multigravida, unspecified trimester: Secondary | ICD-10-CM | POA: Insufficient documentation

## 2014-09-26 ENCOUNTER — Encounter (INDEPENDENT_AMBULATORY_CARE_PROVIDER_SITE_OTHER): Payer: Self-pay

## 2014-09-26 ENCOUNTER — Other Ambulatory Visit: Payer: Self-pay | Admitting: Advanced Practice Midwife

## 2014-09-26 ENCOUNTER — Encounter: Payer: Self-pay | Admitting: *Deleted

## 2014-09-26 ENCOUNTER — Ambulatory Visit (INDEPENDENT_AMBULATORY_CARE_PROVIDER_SITE_OTHER): Payer: Medicaid Other | Admitting: Advanced Practice Midwife

## 2014-09-26 ENCOUNTER — Encounter: Payer: Self-pay | Admitting: Advanced Practice Midwife

## 2014-09-26 VITALS — BP 121/74 | HR 102 | Wt 208.0 lb

## 2014-09-26 DIAGNOSIS — O09521 Supervision of elderly multigravida, first trimester: Secondary | ICD-10-CM

## 2014-09-26 DIAGNOSIS — O3421 Maternal care for scar from previous cesarean delivery: Secondary | ICD-10-CM

## 2014-09-26 DIAGNOSIS — O34219 Maternal care for unspecified type scar from previous cesarean delivery: Secondary | ICD-10-CM | POA: Insufficient documentation

## 2014-09-26 NOTE — Patient Instructions (Signed)
First Trimester of Pregnancy The first trimester of pregnancy is from week 1 until the end of week 12 (months 1 through 3). A week after a sperm fertilizes an egg, the egg will implant on the wall of the uterus. This embryo will begin to develop into a baby. Genes from you and your partner are forming the baby. The female genes determine whether the baby is a boy or a girl. At 6-8 weeks, the eyes and face are formed, and the heartbeat can be seen on ultrasound. At the end of 12 weeks, all the baby's organs are formed.  Now that you are pregnant, you will want to do everything you can to have a healthy baby. Two of the most important things are to get good prenatal care and to follow your health care provider's instructions. Prenatal care is all the medical care you receive before the baby's birth. This care will help prevent, find, and treat any problems during the pregnancy and childbirth. BODY CHANGES Your body goes through many changes during pregnancy. The changes vary from woman to woman.   You may gain or lose a couple of pounds at first.  You may feel sick to your stomach (nauseous) and throw up (vomit). If the vomiting is uncontrollable, call your health care provider.  You may tire easily.  You may develop headaches that can be relieved by medicines approved by your health care provider.  You may urinate more often. Painful urination may mean you have a bladder infection.  You may develop heartburn as a result of your pregnancy.  You may develop constipation because certain hormones are causing the muscles that push waste through your intestines to slow down.  You may develop hemorrhoids or swollen, bulging veins (varicose veins).  Your breasts may begin to grow larger and become tender. Your nipples may stick out more, and the tissue that surrounds them (areola) may become darker.  Your gums may bleed and may be sensitive to brushing and flossing.  Dark spots or blotches (chloasma,  mask of pregnancy) may develop on your face. This will likely fade after the baby is born.  Your menstrual periods will stop.  You may have a loss of appetite.  You may develop cravings for certain kinds of food.  You may have changes in your emotions from day to day, such as being excited to be pregnant or being concerned that something may go wrong with the pregnancy and baby.  You may have more vivid and strange dreams.  You may have changes in your hair. These can include thickening of your hair, rapid growth, and changes in texture. Some women also have hair loss during or after pregnancy, or hair that feels dry or thin. Your hair will most likely return to normal after your baby is born. WHAT TO EXPECT AT YOUR PRENATAL VISITS During a routine prenatal visit:  You will be weighed to make sure you and the baby are growing normally.  Your blood pressure will be taken.  Your abdomen will be measured to track your baby's growth.  The fetal heartbeat will be listened to starting around week 10 or 12 of your pregnancy.  Test results from any previous visits will be discussed. Your health care provider may ask you:  How you are feeling.  If you are feeling the baby move.  If you have had any abnormal symptoms, such as leaking fluid, bleeding, severe headaches, or abdominal cramping.  If you have any questions. Other tests   that may be performed during your first trimester include:  Blood tests to find your blood type and to check for the presence of any previous infections. They will also be used to check for low iron levels (anemia) and Rh antibodies. Later in the pregnancy, blood tests for diabetes will be done along with other tests if problems develop.  Urine tests to check for infections, diabetes, or protein in the urine.  An ultrasound to confirm the proper growth and development of the baby.  An amniocentesis to check for possible genetic problems.  Fetal screens for  spina bifida and Down syndrome.  You may need other tests to make sure you and the baby are doing well. HOME CARE INSTRUCTIONS  Medicines  Follow your health care provider's instructions regarding medicine use. Specific medicines may be either safe or unsafe to take during pregnancy.  Take your prenatal vitamins as directed.  If you develop constipation, try taking a stool softener if your health care provider approves. Diet  Eat regular, well-balanced meals. Choose a variety of foods, such as meat or vegetable-based protein, fish, milk and low-fat dairy products, vegetables, fruits, and whole grain breads and cereals. Your health care provider will help you determine the amount of weight gain that is right for you.  Avoid raw meat and uncooked cheese. These carry germs that can cause birth defects in the baby.  Eating four or five small meals rather than three large meals a day may help relieve nausea and vomiting. If you start to feel nauseous, eating a few soda crackers can be helpful. Drinking liquids between meals instead of during meals also seems to help nausea and vomiting.  If you develop constipation, eat more high-fiber foods, such as fresh vegetables or fruit and whole grains. Drink enough fluids to keep your urine clear or pale yellow. Activity and Exercise  Exercise only as directed by your health care provider. Exercising will help you:  Control your weight.  Stay in shape.  Be prepared for labor and delivery.  Experiencing pain or cramping in the lower abdomen or low back is a good sign that you should stop exercising. Check with your health care provider before continuing normal exercises.  Try to avoid standing for long periods of time. Move your legs often if you must stand in one place for a long time.  Avoid heavy lifting.  Wear low-heeled shoes, and practice good posture.  You may continue to have sex unless your health care provider directs you  otherwise. Relief of Pain or Discomfort  Wear a good support bra for breast tenderness.   Take warm sitz baths to soothe any pain or discomfort caused by hemorrhoids. Use hemorrhoid cream if your health care provider approves.   Rest with your legs elevated if you have leg cramps or low back pain.  If you develop varicose veins in your legs, wear support hose. Elevate your feet for 15 minutes, 3-4 times a day. Limit salt in your diet. Prenatal Care  Schedule your prenatal visits by the twelfth week of pregnancy. They are usually scheduled monthly at first, then more often in the last 2 months before delivery.  Write down your questions. Take them to your prenatal visits.  Keep all your prenatal visits as directed by your health care provider. Safety  Wear your seat belt at all times when driving.  Make a list of emergency phone numbers, including numbers for family, friends, the hospital, and police and fire departments. General Tips    Ask your health care provider for a referral to a local prenatal education class. Begin classes no later than at the beginning of month 6 of your pregnancy.  Ask for help if you have counseling or nutritional needs during pregnancy. Your health care provider can offer advice or refer you to specialists for help with various needs.  Do not use hot tubs, steam rooms, or saunas.  Do not douche or use tampons or scented sanitary pads.  Do not cross your legs for long periods of time.  Avoid cat litter boxes and soil used by cats. These carry germs that can cause birth defects in the baby and possibly loss of the fetus by miscarriage or stillbirth.  Avoid all smoking, herbs, alcohol, and medicines not prescribed by your health care provider. Chemicals in these affect the formation and growth of the baby.  Schedule a dentist appointment. At home, brush your teeth with a soft toothbrush and be gentle when you floss. SEEK MEDICAL CARE IF:   You have  dizziness.  You have mild pelvic cramps, pelvic pressure, or nagging pain in the abdominal area.  You have persistent nausea, vomiting, or diarrhea.  You have a bad smelling vaginal discharge.  You have pain with urination.  You notice increased swelling in your face, hands, legs, or ankles. SEEK IMMEDIATE MEDICAL CARE IF:   You have a fever.  You are leaking fluid from your vagina.  You have spotting or bleeding from your vagina.  You have severe abdominal cramping or pain.  You have rapid weight gain or loss.  You vomit blood or material that looks like coffee grounds.  You are exposed to German measles and have never had them.  You are exposed to fifth disease or chickenpox.  You develop a severe headache.  You have shortness of breath.  You have any kind of trauma, such as from a fall or a car accident. Document Released: 04/30/2001 Document Revised: 09/20/2013 Document Reviewed: 03/16/2013 ExitCare Patient Information 2015 [employer]. This information is not intended to replace advice given to you by your health care provider. Make sure you discuss any questions you have with your health care provider.  

## 2014-09-26 NOTE — Progress Notes (Signed)
NOB labs and early 1 hour GTT today. Declines first trimester screen and NIPS until insurance goes through.

## 2014-09-28 LAB — PRENATAL PROFILE (SOLSTAS)
Antibody Screen: NEGATIVE
BASOS ABS: 0 10*3/uL (ref 0.0–0.1)
Basophils Relative: 0 % (ref 0–1)
EOS ABS: 0 10*3/uL (ref 0.0–0.7)
Eosinophils Relative: 0 % (ref 0–5)
HCT: 37.1 % (ref 36.0–46.0)
HIV 1&2 Ab, 4th Generation: NONREACTIVE
Hemoglobin: 12.2 g/dL (ref 12.0–15.0)
Hepatitis B Surface Ag: NEGATIVE
LYMPHS ABS: 2.4 10*3/uL (ref 0.7–4.0)
Lymphocytes Relative: 27 % (ref 12–46)
MCH: 29.4 pg (ref 26.0–34.0)
MCHC: 32.9 g/dL (ref 30.0–36.0)
MCV: 89.4 fL (ref 78.0–100.0)
MONOS PCT: 6 % (ref 3–12)
MPV: 10.7 fL (ref 8.6–12.4)
Monocytes Absolute: 0.5 10*3/uL (ref 0.1–1.0)
NEUTROS ABS: 6 10*3/uL (ref 1.7–7.7)
Neutrophils Relative %: 67 % (ref 43–77)
Platelets: 271 10*3/uL (ref 150–400)
RBC: 4.15 MIL/uL (ref 3.87–5.11)
RDW: 14.3 % (ref 11.5–15.5)
RH TYPE: POSITIVE
Rubella: 2.8 Index — ABNORMAL HIGH (ref <0.90)
WBC: 8.9 10*3/uL (ref 4.0–10.5)

## 2014-09-28 LAB — GC/CHLAMYDIA PROBE AMP
CT Probe RNA: NEGATIVE
GC PROBE AMP APTIMA: NEGATIVE

## 2014-09-28 LAB — CULTURE, OB URINE
Colony Count: NO GROWTH
Organism ID, Bacteria: NO GROWTH

## 2014-10-18 ENCOUNTER — Encounter: Payer: Self-pay | Admitting: Obstetrics & Gynecology

## 2014-10-18 ENCOUNTER — Ambulatory Visit (INDEPENDENT_AMBULATORY_CARE_PROVIDER_SITE_OTHER): Payer: Self-pay | Admitting: Obstetrics & Gynecology

## 2014-10-18 VITALS — BP 123/77 | HR 93 | Wt 209.0 lb

## 2014-10-18 DIAGNOSIS — Z3481 Encounter for supervision of other normal pregnancy, first trimester: Secondary | ICD-10-CM

## 2014-10-18 DIAGNOSIS — O3421 Maternal care for scar from previous cesarean delivery: Secondary | ICD-10-CM

## 2014-10-18 DIAGNOSIS — O9989 Other specified diseases and conditions complicating pregnancy, childbirth and the puerperium: Secondary | ICD-10-CM

## 2014-10-18 DIAGNOSIS — O2341 Unspecified infection of urinary tract in pregnancy, first trimester: Secondary | ICD-10-CM

## 2014-10-18 DIAGNOSIS — O09522 Supervision of elderly multigravida, second trimester: Secondary | ICD-10-CM

## 2014-10-18 DIAGNOSIS — Z3491 Encounter for supervision of normal pregnancy, unspecified, first trimester: Secondary | ICD-10-CM

## 2014-10-18 DIAGNOSIS — O34219 Maternal care for unspecified type scar from previous cesarean delivery: Secondary | ICD-10-CM

## 2014-10-18 DIAGNOSIS — J020 Streptococcal pharyngitis: Secondary | ICD-10-CM

## 2014-10-18 LAB — POCT URINALYSIS DIPSTICK
Glucose, UA: NEGATIVE
Leukocytes, UA: NEGATIVE
NITRITE UA: NORMAL
PH UA: 5
Urobilinogen, UA: NEGATIVE

## 2014-10-18 MED ORDER — BUTALBITAL-APAP-CAFFEINE 50-325-40 MG PO CAPS: 1.0000 | ORAL_CAPSULE | Freq: Four times a day (QID) | ORAL | Status: DC | PRN

## 2014-10-18 MED ORDER — CEPHALEXIN 500 MG PO CAPS: 500.0000 mg | ORAL_CAPSULE | Freq: Four times a day (QID) | ORAL | Status: DC

## 2014-10-18 NOTE — Progress Notes (Signed)
Patient complaining of UTI and her children were recently diagnosed wit streph throat and she as a sore throat. Armandina StammerJennifer Latiana Tomei RN BSN

## 2014-10-18 NOTE — Progress Notes (Signed)
Patient complaining of urinary frequency and urgency.  She has had UTIs in the past and this feels the same.  Hgb on dip UA.  Patient also c/o sore throat.  Two of her children recntly treated for Strep.  No fever, CP, SOB.   Treat for UTI which will also treat strep throat.  Rapid strep sent. Patient wants [facility] / [facility].  She has no insurance so does not want genetics counseling and wants test with cheapest copay.   [facility].  For 5th c/s.  Does not want BTL. Anatomy US at 19-20 weeks.

## 2014-10-24 ENCOUNTER — Encounter: Payer: Self-pay | Admitting: Advanced Practice Midwife

## 2014-11-14 ENCOUNTER — Ambulatory Visit (INDEPENDENT_AMBULATORY_CARE_PROVIDER_SITE_OTHER): Payer: Self-pay | Admitting: Advanced Practice Midwife

## 2014-11-14 VITALS — BP 105/57 | HR 88 | Wt 206.0 lb

## 2014-11-14 DIAGNOSIS — Z3402 Encounter for supervision of normal first pregnancy, second trimester: Secondary | ICD-10-CM

## 2014-11-14 DIAGNOSIS — O09522 Supervision of elderly multigravida, second trimester: Secondary | ICD-10-CM

## 2014-11-14 DIAGNOSIS — J312 Chronic pharyngitis: Secondary | ICD-10-CM

## 2014-11-14 NOTE — Progress Notes (Signed)
Subjective:  Ellen Short is a 34 y.o. [phone] at [redacted]w[redacted]d being seen today for ongoing prenatal care.  Patient reports intermittent sore throats. Took Keflex as directed after last visit 5/31. Sore throat resolved 2 weeks later x 1 week, then returned. Having nasal congestion. Discussed possible post-nasal drip, mouth breathing at night, reflux. .  Contractions: Not present.  Vag. Bleeding: None. Movement: Absent. Denies leaking of fluid.   The following portions of the patient's history were reviewed and updated as appropriate: allergies, current medications, past family history, past medical history, past social history, past surgical history and problem list.   Objective:   Filed Vitals:   11/14/14 1108  BP: 105/57  Pulse: 88  Weight: 206 lb (93.441 kg)    Fetal Status: Fetal Heart Rate (bpm): 137 (Simultaneous filing. User may not have seen previous data.)   Movement: Absent No quickening    General:  Alert, oriented and cooperative. Patient is in no acute distress.  Skin: Skin is warm and dry. No rash noted.   Cardiovascular: Normal heart rate noted  Respiratory: Normal respiratory effort, no problems with respiration noted  Abdomen: Soft, gravid, appropriate for gestational age. Pain/Pressure: Absent     Vaginal: Vag. Bleeding: None.    Vag D/C Character: Mucous  Cervix: Not evaluated        Extremities: Normal range of motion.  Edema: None  Mental Status: Normal mood and affect. Normal behavior. Normal judgment and thought content.   Urinalysis: Urine Protein: Trace Urine Glucose: Negative  Assessment and Plan:  Pregnancy: [id number] at [redacted]w[redacted]d  1. AMA (advanced maternal age) multigravida 35+, second trimester    - US OB Detail + 56 Wk; Future  2. Supervision of normal first pregnancy in second trimester  - US OB Detail + 14 Wk; Future   Preterm labor symptoms and general obstetric precautions including but not limited to vaginal bleeding, contractions, leaking of fluid  and fetal movement were reviewed in detail with the patient.  Please refer to After Visit Summary for other counseling recommendations.  H[facility] scheduled  4 weeks   [place], [place]

## 2014-11-14 NOTE — Patient Instructions (Signed)
Second Trimester of Pregnancy The second trimester is from week 13 through week 28, months 4 through 6. The second trimester is often a time when you feel your best. Your body has also adjusted to being pregnant, and you begin to feel better physically. Usually, morning sickness has lessened or quit completely, you may have more energy, and you may have an increase in appetite. The second trimester is also a time when the fetus is growing rapidly. At the end of the sixth month, the fetus is about 9 inches long and weighs about 1 pounds. You will likely begin to feel the baby move (quickening) between 18 and 20 weeks of the pregnancy. BODY CHANGES Your body goes through many changes during pregnancy. The changes vary from woman to woman.   Your weight will continue to increase. You will notice your lower abdomen bulging out.  You may begin to get stretch marks on your hips, abdomen, and breasts.  You may develop headaches that can be relieved by medicines approved by your health care provider.  You may urinate more often because the fetus is pressing on your bladder.  You may develop or continue to have heartburn as a result of your pregnancy.  You may develop constipation because certain hormones are causing the muscles that push waste through your intestines to slow down.  You may develop hemorrhoids or swollen, bulging veins (varicose veins).  You may have back pain because of the weight gain and pregnancy hormones relaxing your joints between the bones in your pelvis and as a result of a shift in weight and the muscles that support your balance.  Your breasts will continue to grow and be tender.  Your gums may bleed and may be sensitive to brushing and flossing.  Dark spots or blotches (chloasma, mask of pregnancy) may develop on your face. This will likely fade after the baby is born.  A dark line from your belly button to the pubic area (linea nigra) may appear. This will likely fade  after the baby is born.  You may have changes in your hair. These can include thickening of your hair, rapid growth, and changes in texture. Some women also have hair loss during or after pregnancy, or hair that feels dry or thin. Your hair will most likely return to normal after your baby is born. WHAT TO EXPECT AT YOUR PRENATAL VISITS During a routine prenatal visit:  You will be weighed to make sure you and the fetus are growing normally.  Your blood pressure will be taken.  Your abdomen will be measured to track your baby's growth.  The fetal heartbeat will be listened to.  Any test results from the previous visit will be discussed. Your health care provider may ask you:  How you are feeling.  If you are feeling the baby move.  If you have had any abnormal symptoms, such as leaking fluid, bleeding, severe headaches, or abdominal cramping.  If you have any questions. Other tests that may be performed during your second trimester include:  Blood tests that check for:  Low iron levels (anemia).  Gestational diabetes (between 24 and 28 weeks).  Rh antibodies.  Urine tests to check for infections, diabetes, or protein in the urine.  An ultrasound to confirm the proper growth and development of the baby.  An amniocentesis to check for possible genetic problems.  Fetal screens for spina bifida and Down syndrome. HOME CARE INSTRUCTIONS   Avoid all smoking, herbs, alcohol, and unprescribed   drugs. These chemicals affect the formation and growth of the baby.  Follow your health care provider's instructions regarding medicine use. There are medicines that are either safe or unsafe to take during pregnancy.  Exercise only as directed by your health care provider. Experiencing uterine cramps is a good sign to stop exercising.  Continue to eat regular, healthy meals.  Wear a good support bra for breast tenderness.  Do not use hot tubs, steam rooms, or saunas.  Wear your  seat belt at all times when driving.  Avoid raw meat, uncooked cheese, cat litter boxes, and soil used by cats. These carry germs that can cause birth defects in the baby.  Take your prenatal vitamins.  Try taking a stool softener (if your health care provider approves) if you develop constipation. Eat more high-fiber foods, such as fresh vegetables or fruit and whole grains. Drink plenty of fluids to keep your urine clear or pale yellow.  Take warm sitz baths to soothe any pain or discomfort caused by hemorrhoids. Use hemorrhoid cream if your health care provider approves.  If you develop varicose veins, wear support hose. Elevate your feet for 15 minutes, 3-4 times a day. Limit salt in your diet.  Avoid heavy lifting, wear low heel shoes, and practice good posture.  Rest with your legs elevated if you have leg cramps or low back pain.  Visit your dentist if you have not gone yet during your pregnancy. Use a soft toothbrush to brush your teeth and be gentle when you floss.  A sexual relationship may be continued unless your health care provider directs you otherwise.  Continue to go to all your prenatal visits as directed by your health care provider. SEEK MEDICAL CARE IF:   You have dizziness.  You have mild pelvic cramps, pelvic pressure, or nagging pain in the abdominal area.  You have persistent nausea, vomiting, or diarrhea.  You have a bad smelling vaginal discharge.  You have pain with urination. SEEK IMMEDIATE MEDICAL CARE IF:   You have a fever.  You are leaking fluid from your vagina.  You have spotting or bleeding from your vagina.  You have severe abdominal cramping or pain.  You have rapid weight gain or loss.  You have shortness of breath with chest pain.  You notice sudden or extreme swelling of your face, hands, ankles, feet, or legs.  You have not felt your baby move in over an hour.  You have severe headaches that do not go away with  medicine.  You have vision changes. Document Released: 04/30/2001 Document Revised: 05/11/2013 Document Reviewed: 07/07/2012 [employer] Patient Information 2015 [employer]. This information is not intended to replace advice given to you by your health care provider. Make sure you discuss any questions you have with your health care provider.  

## 2014-11-14 NOTE — Progress Notes (Signed)
Harmony drawn

## 2014-11-22 ENCOUNTER — Telehealth: Payer: Self-pay

## 2014-11-22 NOTE — Telephone Encounter (Signed)
Left message for patient that [facility] test revealed she is at low risk for any Trisomy 13,18,or21. Ellen StammerJennifer Leam Madero RN BSN

## 2014-11-23 ENCOUNTER — Encounter: Payer: Self-pay | Admitting: *Deleted

## 2014-11-24 ENCOUNTER — Encounter (HOSPITAL_COMMUNITY): Payer: Self-pay | Admitting: Advanced Practice Midwife

## 2014-11-25 ENCOUNTER — Ambulatory Visit (HOSPITAL_COMMUNITY)
Admission: RE | Admit: 2014-11-25 | Discharge: 2014-11-25 | Disposition: A | Discharge status: Home / Self Care | Payer: Self-pay | Source: Ambulatory Visit | Attending: Advanced Practice Midwife | Admitting: Advanced Practice Midwife

## 2014-11-25 VITALS — BP 128/72 | HR 91 | Wt 209.2 lb

## 2014-11-25 DIAGNOSIS — O09522 Supervision of elderly multigravida, second trimester: Secondary | ICD-10-CM | POA: Insufficient documentation

## 2014-11-25 DIAGNOSIS — Z3A00 Weeks of gestation of pregnancy not specified: Secondary | ICD-10-CM | POA: Insufficient documentation

## 2014-11-25 DIAGNOSIS — O09529 Supervision of elderly multigravida, unspecified trimester: Secondary | ICD-10-CM | POA: Insufficient documentation

## 2014-11-25 DIAGNOSIS — Z3689 Encounter for other specified antenatal screening: Secondary | ICD-10-CM | POA: Insufficient documentation

## 2014-11-25 DIAGNOSIS — Z3491 Encounter for supervision of normal pregnancy, unspecified, first trimester: Secondary | ICD-10-CM

## 2014-11-25 DIAGNOSIS — Z3402 Encounter for supervision of normal first pregnancy, second trimester: Secondary | ICD-10-CM

## 2014-11-25 DIAGNOSIS — Z3A19 19 weeks gestation of pregnancy: Secondary | ICD-10-CM | POA: Insufficient documentation

## 2014-11-25 DIAGNOSIS — Z36 Encounter for antenatal screening of mother: Secondary | ICD-10-CM | POA: Insufficient documentation

## 2014-11-25 NOTE — Progress Notes (Signed)
Genetic Counseling  High-Risk Gestation Note  Appointment Date:  11/25/2014 Referred By: Dorathy KinsmanSmith, [name], CNM Date of Birth:  07-Dec-1980 Partner:  Ellen SaxElijah Short   Pregnancy History: [id number]: [id number] Estimated Date of Delivery: 04/19/15 Estimated Gestational Age: 7653w2d Attending: Particia NearingMartha Decker, MD   Ellen Short and her husband, Mr. Ellen Short, were seen for genetic counseling because of a maternal age of 34 y.o..  She will be 34 years old at delivery.   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   We reviewed chromosomes, nondisjunction, and the associated 1 in 111 risk for fetal aneuploidy at 6253w2d gestation related to a maternal age of 34 years old at delivery.  They were counseled that the risk for aneuploidy decreases as gestational age increases, accounting for those pregnancies which spontaneously abort.  We specifically discussed Down syndrome (trisomy 6321), trisomies 7513 and 4218, and sex chromosome aneuploidies (47,XXX and 47,XXY) including the common features and prognoses of each.   Ellen Short previously had noninvasive prenatal screening (NIPS) facilitated through her OB office, [facility]. We reviewed that these results were within normal range, indicating less than 1 in 10,000 risk for trisomies 21, 18, and 13. We reviewed the methodology of this screen and the detection rates for each condition. They understand that this did not assess for all chromosome conditions, including sex chromosome  aneuploidy, and is not diagnostic for the conditions for which it screens.   We reviewed additional available screening option of detailed ultrasound.  We reviewed the benefits and limitations of this option. They were also counseled regarding diagnostic testing via amniocentesis. We reviewed the approximate 1 in 300-500 risk for complications for amniocentesis, including spontaneous pregnancy loss. After consideration of all the options, she elected to proceed with detailed ultrasound only and declined amniocentesis.   A complete ultrasound was performed today. The ultrasound report will be sent under separate cover. There were no visualized fetal anomalies or markers suggestive of aneuploidy. Limited views of fetal heart obtained today. Diagnostic testing was declined today.  They understand that screening tests cannot rule out all birth defects or genetic syndromes. The patient was advised of this limitation and states she still does not want additional testing at this time. Follow-up ultrasound was planned to complete fetal anatomic survey.   Ellen Short was provided with written information regarding cystic fibrosis (CF) including the carrier frequency and incidence in the Caucasian population, the availability of carrier testing and prenatal diagnosis if indicated.  In addition, we discussed that CF is routinely screened for as part of the [place] newborn screening panel.  She declined CF carrier screening today.   Both family histories were reviewed and found to be contributory for recurrent miscarriage. The couple reported a history of 4 first trimester miscarriages together, and Mrs. Short also had a miscarriage with a previous partner. Approximately 1 in 6 confirmed pregnancies results in miscarriage. A single underlying cause is more likely to be suspected when a couple has experienced 3 or more losses. It is less likely that there will be an identifiable single underlying cause when a  couple has experienced less than 3 losses. We discussed there are several possible causes. We reviewed chromosomes and examples of chromosome conditions. In approximately 3-8% of couples with recurrent pregnancy loss, one partner carries a chromosome variant, such as a balanced translocation. Being a carrier of a chromosome variant can increase the risk for abnormalities in the sperm or egg cell, which can increase the risk for miscarriage or the birth of a child with birth defects and/or intellectual disability. Ellen Short declined work up for recurrent miscarriage at this time.    Additionally, the patient reported that her son, with a previous partner, was born with one kidney. 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 Remaining visualized fetal anatomy appeared normal. Complete ultrasound results reported separately. Renal agenesis is typically sporadic. However, there are familial cases reported that are consistent with autosomal dominant inheritance. Additionally, prenatal exposures such as warfarin, cocaine, and maternal diabetes have been reported to be associated with renal agenesis. Renal agenesis is described as an underlying feature of many single gene conditions and can also be seen with chromosome conditions. Given the reported family history, recurrence risk for the current pregnancy is likely low but increased above the general population risk. Detailed ultrasound is available to assess fetal kidneys. Without further information regarding the provided family history, an accurate genetic risk cannot be calculated. Further genetic counseling is warranted if more information is obtained.  Ellen Short denied exposure to environmental toxins or chemical agents. She denied the use of alcohol, tobacco or street drugs. She denied significant viral illnesses during the course of her pregnancy. Her medical and surgical histories were noncontributory.   I counseled  this couple regarding the above risks and available options.  The approximate face-to-face time with the genetic counselor was 35 minutes.  Quinn Plowman, MS,  Certified Genetic Counselor 11/25/2014

## 2014-12-01 ENCOUNTER — Encounter (HOSPITAL_COMMUNITY): Payer: Self-pay

## 2014-12-01 ENCOUNTER — Ambulatory Visit (HOSPITAL_COMMUNITY): Payer: Self-pay

## 2014-12-12 ENCOUNTER — Ambulatory Visit (INDEPENDENT_AMBULATORY_CARE_PROVIDER_SITE_OTHER): Payer: Self-pay | Admitting: Advanced Practice Midwife

## 2014-12-12 VITALS — BP 115/68 | HR 90 | Wt 209.0 lb

## 2014-12-12 DIAGNOSIS — Z3483 Encounter for supervision of other normal pregnancy, third trimester: Secondary | ICD-10-CM

## 2014-12-12 DIAGNOSIS — Z3491 Encounter for supervision of normal pregnancy, unspecified, first trimester: Secondary | ICD-10-CM

## 2014-12-14 NOTE — Patient Instructions (Signed)
Second Trimester of Pregnancy The second trimester is from week 13 through week 28, months 4 through 6. The second trimester is often a time when you feel your best. Your body has also adjusted to being pregnant, and you begin to feel better physically. Usually, morning sickness has lessened or quit completely, you may have more energy, and you may have an increase in appetite. The second trimester is also a time when the fetus is growing rapidly. At the end of the sixth month, the fetus is about 9 inches long and weighs about 1 pounds. You will likely begin to feel the baby move (quickening) between 18 and 20 weeks of the pregnancy. BODY CHANGES Your body goes through many changes during pregnancy. The changes vary from woman to woman.   Your weight will continue to increase. You will notice your lower abdomen bulging out.  You may begin to get stretch marks on your hips, abdomen, and breasts.  You may develop headaches that can be relieved by medicines approved by your health care provider.  You may urinate more often because the fetus is pressing on your bladder.  You may develop or continue to have heartburn as a result of your pregnancy.  You may develop constipation because certain hormones are causing the muscles that push waste through your intestines to slow down.  You may develop hemorrhoids or swollen, bulging veins (varicose veins).  You may have back pain because of the weight gain and pregnancy hormones relaxing your joints between the bones in your pelvis and as a result of a shift in weight and the muscles that support your balance.  Your breasts will continue to grow and be tender.  Your gums may bleed and may be sensitive to brushing and flossing.  Dark spots or blotches (chloasma, mask of pregnancy) may develop on your face. This will likely fade after the baby is born.  A dark line from your belly button to the pubic area (linea nigra) may appear. This will likely fade  after the baby is born.  You may have changes in your hair. These can include thickening of your hair, rapid growth, and changes in texture. Some women also have hair loss during or after pregnancy, or hair that feels dry or thin. Your hair will most likely return to normal after your baby is born. WHAT TO EXPECT AT YOUR PRENATAL VISITS During a routine prenatal visit:  You will be weighed to make sure you and the fetus are growing normally.  Your blood pressure will be taken.  Your abdomen will be measured to track your baby's growth.  The fetal heartbeat will be listened to.  Any test results from the previous visit will be discussed. Your health care provider may ask you:  How you are feeling.  If you are feeling the baby move.  If you have had any abnormal symptoms, such as leaking fluid, bleeding, severe headaches, or abdominal cramping.  If you have any questions. Other tests that may be performed during your second trimester include:  Blood tests that check for:  Low iron levels (anemia).  Gestational diabetes (between 24 and 28 weeks).  Rh antibodies.  Urine tests to check for infections, diabetes, or protein in the urine.  An ultrasound to confirm the proper growth and development of the baby.  An amniocentesis to check for possible genetic problems.  Fetal screens for spina bifida and Down syndrome. HOME CARE INSTRUCTIONS   Avoid all smoking, herbs, alcohol, and unprescribed   drugs. These chemicals affect the formation and growth of the baby.  Follow your health care provider's instructions regarding medicine use. There are medicines that are either safe or unsafe to take during pregnancy.  Exercise only as directed by your health care provider. Experiencing uterine cramps is a good sign to stop exercising.  Continue to eat regular, healthy meals.  Wear a good support bra for breast tenderness.  Do not use hot tubs, steam rooms, or saunas.  Wear your  seat belt at all times when driving.  Avoid raw meat, uncooked cheese, cat litter boxes, and soil used by cats. These carry germs that can cause birth defects in the baby.  Take your prenatal vitamins.  Try taking a stool softener (if your health care provider approves) if you develop constipation. Eat more high-fiber foods, such as fresh vegetables or fruit and whole grains. Drink plenty of fluids to keep your urine clear or pale yellow.  Take warm sitz baths to soothe any pain or discomfort caused by hemorrhoids. Use hemorrhoid cream if your health care provider approves.  If you develop varicose veins, wear support hose. Elevate your feet for 15 minutes, 3-4 times a day. Limit salt in your diet.  Avoid heavy lifting, wear low heel shoes, and practice good posture.  Rest with your legs elevated if you have leg cramps or low back pain.  Visit your dentist if you have not gone yet during your pregnancy. Use a soft toothbrush to brush your teeth and be gentle when you floss.  A sexual relationship may be continued unless your health care provider directs you otherwise.  Continue to go to all your prenatal visits as directed by your health care provider. SEEK MEDICAL CARE IF:   You have dizziness.  You have mild pelvic cramps, pelvic pressure, or nagging pain in the abdominal area.  You have persistent nausea, vomiting, or diarrhea.  You have a bad smelling vaginal discharge.  You have pain with urination. SEEK IMMEDIATE MEDICAL CARE IF:   You have a fever.  You are leaking fluid from your vagina.  You have spotting or bleeding from your vagina.  You have severe abdominal cramping or pain.  You have rapid weight gain or loss.  You have shortness of breath with chest pain.  You notice sudden or extreme swelling of your face, hands, ankles, feet, or legs.  You have not felt your baby move in over an hour.  You have severe headaches that do not go away with  medicine.  You have vision changes. Document Released: 04/30/2001 Document Revised: 05/11/2013 Document Reviewed: 07/07/2012 [employer] Patient Information 2015 [employer]. This information is not intended to replace advice given to you by your health care provider. Make sure you discuss any questions you have with your health care provider.  

## 2014-12-14 NOTE — Progress Notes (Signed)
Anatomy ultrasound incomplete. F/U at 24 weeks scheduled.

## 2014-12-22 ENCOUNTER — Other Ambulatory Visit: Payer: Self-pay | Admitting: Advanced Practice Midwife

## 2014-12-22 DIAGNOSIS — Z3A23 23 weeks gestation of pregnancy: Secondary | ICD-10-CM

## 2014-12-22 DIAGNOSIS — Z8489 Family history of other specified conditions: Secondary | ICD-10-CM

## 2014-12-22 DIAGNOSIS — O99213 Obesity complicating pregnancy, third trimester: Secondary | ICD-10-CM

## 2014-12-22 DIAGNOSIS — O09522 Supervision of elderly multigravida, second trimester: Secondary | ICD-10-CM

## 2014-12-22 DIAGNOSIS — O34219 Maternal care for unspecified type scar from previous cesarean delivery: Secondary | ICD-10-CM

## 2014-12-23 ENCOUNTER — Encounter (HOSPITAL_COMMUNITY): Payer: Self-pay

## 2014-12-23 ENCOUNTER — Ambulatory Visit (HOSPITAL_COMMUNITY)
Admission: RE | Admit: 2014-12-23 | Discharge: 2014-12-23 | Disposition: A | Discharge status: Home / Self Care | Payer: Self-pay | Source: Ambulatory Visit | Attending: Advanced Practice Midwife | Admitting: Advanced Practice Midwife

## 2014-12-23 VITALS — BP 125/73 | HR 93 | Wt 208.0 lb

## 2014-12-23 DIAGNOSIS — O3421 Maternal care for scar from previous cesarean delivery: Secondary | ICD-10-CM | POA: Insufficient documentation

## 2014-12-23 DIAGNOSIS — O34219 Maternal care for unspecified type scar from previous cesarean delivery: Secondary | ICD-10-CM

## 2014-12-23 DIAGNOSIS — Z3491 Encounter for supervision of normal pregnancy, unspecified, first trimester: Secondary | ICD-10-CM

## 2014-12-23 DIAGNOSIS — O09522 Supervision of elderly multigravida, second trimester: Secondary | ICD-10-CM | POA: Insufficient documentation

## 2014-12-23 DIAGNOSIS — Z3A23 23 weeks gestation of pregnancy: Secondary | ICD-10-CM | POA: Insufficient documentation

## 2014-12-23 DIAGNOSIS — Z8489 Family history of other specified conditions: Secondary | ICD-10-CM

## 2014-12-23 DIAGNOSIS — E669 Obesity, unspecified: Secondary | ICD-10-CM | POA: Insufficient documentation

## 2014-12-23 DIAGNOSIS — O99213 Obesity complicating pregnancy, third trimester: Secondary | ICD-10-CM | POA: Insufficient documentation

## 2015-01-06 ENCOUNTER — Encounter (HOSPITAL_COMMUNITY): Payer: Self-pay | Admitting: *Deleted

## 2015-01-06 ENCOUNTER — Inpatient Hospital Stay (HOSPITAL_COMMUNITY)
Admission: AD | Admit: 2015-01-06 | Discharge: 2015-01-06 | Disposition: A | Discharge status: Home / Self Care | Payer: Self-pay | Source: Ambulatory Visit | Attending: Family Medicine | Admitting: Family Medicine

## 2015-01-06 DIAGNOSIS — Z3A25 25 weeks gestation of pregnancy: Secondary | ICD-10-CM | POA: Insufficient documentation

## 2015-01-06 DIAGNOSIS — M62838 Other muscle spasm: Secondary | ICD-10-CM | POA: Insufficient documentation

## 2015-01-06 DIAGNOSIS — O9989 Other specified diseases and conditions complicating pregnancy, childbirth and the puerperium: Secondary | ICD-10-CM

## 2015-01-06 DIAGNOSIS — O2340 Unspecified infection of urinary tract in pregnancy, unspecified trimester: Secondary | ICD-10-CM | POA: Diagnosis present

## 2015-01-06 DIAGNOSIS — M545 Low back pain, unspecified: Secondary | ICD-10-CM

## 2015-01-06 DIAGNOSIS — O2342 Unspecified infection of urinary tract in pregnancy, second trimester: Secondary | ICD-10-CM | POA: Insufficient documentation

## 2015-01-06 DIAGNOSIS — O09522 Supervision of elderly multigravida, second trimester: Secondary | ICD-10-CM

## 2015-01-06 DIAGNOSIS — O34219 Maternal care for unspecified type scar from previous cesarean delivery: Secondary | ICD-10-CM

## 2015-01-06 LAB — URINE MICROSCOPIC-ADD ON

## 2015-01-06 LAB — URINALYSIS, ROUTINE W REFLEX MICROSCOPIC
Bilirubin Urine: NEGATIVE
Glucose, UA: NEGATIVE mg/dL
KETONES UR: 15 mg/dL — AB
Nitrite: POSITIVE — AB
PROTEIN: NEGATIVE mg/dL
Specific Gravity, Urine: 1.015 (ref 1.005–1.030)
Urobilinogen, UA: 0.2 mg/dL (ref 0.0–1.0)
pH: 6 (ref 5.0–8.0)

## 2015-01-06 MED ORDER — HYDROCODONE-ACETAMINOPHEN 5-325 MG PO TABS: 1.0000 | ORAL_TABLET | Freq: Four times a day (QID) | ORAL | Status: DC | PRN

## 2015-01-06 MED ORDER — PREDNISONE 20 MG PO TABS: 40.0000 mg | ORAL_TABLET | Freq: Every day | ORAL | Status: DC

## 2015-01-06 MED ORDER — DEXAMETHASONE SODIUM PHOSPHATE 10 MG/ML IJ SOLN
10.0000 mg | Freq: Once | INTRAMUSCULAR | Status: AC
  Administered 2015-01-06: 10 mg via INTRAMUSCULAR
  Filled 2015-01-06: qty 1

## 2015-01-06 MED ORDER — CEPHALEXIN 500 MG PO CAPS: 500.0000 mg | ORAL_CAPSULE | Freq: Two times a day (BID) | ORAL | Status: DC

## 2015-01-06 MED ORDER — OXYCODONE HCL 5 MG PO TABS
5.0000 mg | ORAL_TABLET | Freq: Once | ORAL | Status: AC
  Administered 2015-01-06: 5 mg via ORAL
  Filled 2015-01-06: qty 1

## 2015-01-06 MED ORDER — CEPHALEXIN 500 MG PO CAPS: 500.0000 mg | ORAL_CAPSULE | Freq: Four times a day (QID) | ORAL | Status: AC

## 2015-01-06 MED ORDER — MORPHINE SULFATE (PF) 4 MG/ML IV SOLN
5.0000 mg | Freq: Once | INTRAVENOUS | Status: AC
  Administered 2015-01-06: 5 mg via SUBCUTANEOUS
  Filled 2015-01-06: qty 2

## 2015-01-06 MED ORDER — ACETAMINOPHEN 500 MG PO TABS
1000.0000 mg | ORAL_TABLET | Freq: Once | ORAL | Status: AC
  Administered 2015-01-06: 1000 mg via ORAL
  Filled 2015-01-06: qty 2

## 2015-01-06 MED ORDER — CEPHALEXIN 250 MG PO CAPS: 250.0000 mg | ORAL_CAPSULE | Freq: Every day | ORAL | Status: DC

## 2015-01-06 NOTE — Discharge Instructions (Signed)
Please schedule appt with your chiropractor once you are feeling better Take Tylenol  every 6 hours scheduled (whether you think this helps or not) Take your vicidon for severe pain  For your UTI  You will take Keflex  four times per day for 7 days  Then you will start Keflex  daily to prevent urinary track infections  Back Exercises Back exercises help treat and prevent back injuries. The goal of back exercises is to increase the strength of your abdominal and back muscles and the flexibility of your back. These exercises should be started when you no longer have back pain. Back exercises include:  Pelvic Tilt. Lie on your back with your knees bent. Tilt your pelvis until the lower part of your back is against the floor. Hold this position 5 to 10 sec and repeat 5 to 10 times.  Knee to Chest. Pull first 1 knee up against your chest and hold for 20 to 30 seconds, repeat this with the other knee, and then both knees. This may be done with the other leg straight or bent, whichever feels better.  Sit-Ups or Curl-Ups. Bend your knees 90 degrees. Start with tilting your pelvis, and do a partial, slow sit-up, lifting your trunk only 30 to 45 degrees off the floor. Take at least 2 to 3 seconds for each sit-up. Do not do sit-ups with your knees out straight. If partial sit-ups are difficult, simply do the above but with only tightening your abdominal muscles and holding it as directed.  Hip-Lift. Lie on your back with your knees flexed 90 degrees. Push down with your feet and shoulders as you raise your hips a couple inches off the floor; hold for 10 seconds, repeat 5 to 10 times.  Back arches. Lie on your stomach, propping yourself up on bent elbows. Slowly press on your hands, causing an arch in your low back. Repeat 3 to 5 times. Any initial stiffness and discomfort should lessen with repetition over time.  Shoulder-Lifts. Lie face down with arms beside your body. Keep hips and torso  pressed to floor as you slowly lift your head and shoulders off the floor. Do not overdo your exercises, especially in the beginning. Exercises may cause you some mild back discomfort which lasts for a few minutes; however, if the pain is more severe, or lasts for more than 15 minutes, do not continue exercises until you see your caregiver. Improvement with exercise therapy for back problems is slow.  See your caregivers for assistance with developing a proper back exercise program. Document Released: 06/13/2004 Document Revised: 07/29/2011 Document Reviewed: 03/07/2011 [facility] Patient Information 2015 [place], [place]. This information is not intended to replace advice given to you by your health care provider. Make sure you discuss any questions you have with your health care provider.  Back Pain in Pregnancy Back pain during pregnancy is common. It happens in about half of all pregnancies. It is important for you and your baby that you remain active during your pregnancy.If you feel that back pain is not allowing you to remain active or sleep well, it is time to see your caregiver. Back pain may be caused by several factors related to changes during your pregnancy.Fortunately, unless you had trouble with your back before your pregnancy, the pain is likely to get better after you deliver. Low back pain usually occurs between the fifth and seventh months of pregnancy. It can, however, happen in the first couple months. Factors that increase the risk of  back problems include:   Previous back problems.  Injury to your back.  Having twins or multiple births.  A chronic cough.  Stress.  Job-related repetitive motions.  Muscle or spinal disease in the back.  Family history of back problems, ruptured (herniated) discs, or osteoporosis.  Depression, anxiety, and panic attacks. CAUSES   When you are pregnant, your body produces a hormone called relaxin. This hormonemakes the ligaments  connecting the low back and pubic bones more flexible. This flexibility allows the baby to be delivered more easily. When your ligaments are loose, your muscles need to work harder to support your back. Soreness in your back can come from tired muscles. Soreness can also come from back tissues that are irritated since they are receiving less support.  As the baby grows, it puts pressure on the nerves and blood vessels in your pelvis. This can cause back pain.  As the baby grows and gets heavier during pregnancy, the uterus pushes the stomach muscles forward and changes your center of gravity. This makes your back muscles work harder to maintain good posture. SYMPTOMS  Lumbar pain during pregnancy Lumbar pain during pregnancy usually occurs at or above the waist in the center of the back. There may be pain and numbness that radiates into your leg or foot. This is similar to low back pain experienced by non-pregnant women. It usually increases with sitting for long periods of time, standing, or repetitive lifting. Tenderness may also be present in the muscles along your upper back. Posterior pelvic pain during pregnancy Pain in the back of the pelvis is more common than lumbar pain in pregnancy. It is a deep pain felt in your side at the waistline, or across the tailbone (sacrum), or in both places. You may have pain on one or both sides. This pain can also go into the buttocks and backs of the upper thighs. Pubic and groin pain may also be present. The pain does not quickly resolve with rest, and morning stiffness may also be present. Pelvic pain during pregnancy can be brought on by most activities. A high level of fitness before and during pregnancy may or may not prevent this problem. Labor pain is usually 1 to 2 minutes apart, lasts for about 1 minute, and involves a bearing down feeling or pressure in your pelvis. However, if you are at term with the pregnancy, constant low back pain can be the  beginning of early labor, and you should be aware of this. DIAGNOSIS  X-rays of the back should not be done during the first 12 to 14 weeks of the pregnancy and only when absolutely necessary during the rest of the pregnancy. MRIs do not give off radiation and are safe during pregnancy. MRIs also should only be done when absolutely necessary. HOME CARE INSTRUCTIONS  Exercise as directed by your caregiver. Exercise is the most effective way to prevent or manage back pain. If you have a back problem, it is especially important to avoid sports that require sudden body movements. Swimming and walking are great activities.  Do not stand in one place for long periods of time.  Do not wear high heels.  Sit in chairs with good posture. Use a pillow on your lower back if necessary. Make sure your head rests over your shoulders and is not hanging forward.  Try sleeping on your side, preferably the left side, with a pillow or two between your legs. If you are sore after a night's rest, your bedmay  betoo soft.Try placing a board between your mattress and box spring.  Listen to your body when lifting.If you are experiencing pain, ask for help or try bending yourknees more so you can use your leg muscles rather than your back muscles. Squat down when picking up something from the floor. Do not bend over.  Eat a healthy diet. Try to gain weight within your caregiver's recommendations.  Use heat or cold packs 3 to 4 times a day for 15 minutes to help with the pain.  Only take over-the-counter or prescription medicines for pain, discomfort, or fever as directed by your caregiver. Sudden (acute) back pain  Use bed rest for only the most extreme, acute episodes of back pain. Prolonged bed rest over 48 hours will aggravate your condition.  Ice is very effective for acute conditions.  Put ice in a plastic bag.  Place a towel between your skin and the bag.  Leave the ice on for 10 to 20 minutes every  2 hours, or as needed.  Using heat packs for 30 minutes prior to activities is also helpful. Continued back pain See your caregiver if you have continued problems. Your caregiver can help or refer you for appropriate physical therapy. With conditioning, most back problems can be avoided. Sometimes, a more serious issue may be the cause of back pain. You should be seen right away if new problems seem to be developing. Your caregiver may recommend:  A maternity girdle.  An elastic sling.  A back brace.  A massage therapist or acupuncture. SEEK MEDICAL CARE IF:   You are not able to do most of your daily activities, even when taking the pain medicine you were given.  You need a referral to a physical therapist or chiropractor.  You want to try acupuncture. SEEK IMMEDIATE MEDICAL CARE IF:  You develop numbness, tingling, weakness, or problems with the use of your arms or legs.  You develop severe back pain that is no longer relieved with medicines.  You have a sudden change in bowel or bladder control.  You have increasing pain in other areas of the body.  You develop shortness of breath, dizziness, or fainting.  You develop nausea, vomiting, or sweating.  You have back pain which is similar to labor pains.  You have back pain along with your water breaking or vaginal bleeding.  You have back pain or numbness that travels down your leg.  Your back pain developed after you fell.  You develop pain on one side of your back. You may have a kidney stone.  You see blood in your urine. You may have a bladder infection or kidney stone.  You have back pain with blisters. You may have shingles. Back pain is fairly common during pregnancy but should not be accepted as just part of the process. Back pain should always be treated as soon as possible. This will make your pregnancy as pleasant as possible. Document Released: 08/14/2005 Document Revised: 07/29/2011 Document Reviewed:  09/25/2010 [facility] Patient Information 2015 [place], [place]. This information is not intended to replace advice given to you by your health care provider. Make sure you discuss any questions you have with your health care provider.

## 2015-01-06 NOTE — MAU Provider Note (Signed)
History    CSN: [id number] Arrival date and time: 01/06/15 1633 First Provider Initiated Contact with Patient 01/06/15 1810      Chief Complaint  Patient presents with  . Back Pain   HPI  Maria Coin is a [id number] @[redacted]w[redacted]d  who presents with complaint of lower back pain.  She states that in all her prior pregnancies she has had similar pain, located primarily in her mid and low back and radiating into her bilateral buttocks. Pain started yesterday and has been increasing in intensity despite stretching, applying heat/ice packs and use of foam roller.  Took 600mg  motrin around 8am this morning without relief. Patient states that the only position she can currently tolerate is bending over while standing and supporting herself on her forearms.  At home, found that positioning herself on all fours on the bed was also helpful, but nothing has really helped with the pain.  She is frustrated that the pain makes it difficult to care for her four other children.  Has tried flexeril in prior pregnancies with some relief, but found that after several doses she was incredibly "anxious and pacing around the house," so would prefer to avoid this medication.  In prior pregnancies has also found Vicodin helped.   In MAU, states her legs are "numb" from prolonged standing but specifically denies any loss of bladder/bowel control or numbness/tingling at home.  Regarding urinary sx, the patient has had 2 treated UTI in this pregnancy. The last was 7/31 when she was seen at [facility] ED for a complicated UTI. Currently has mild dysuria. She completed a 10 day course of kelfex approximately 7 days prior to today.    OB History    Gravida Para Term Preterm AB TAB SAB Ectopic Multiple Living   9 4 4  0 4 0 4 0 0 4      Past Medical History  Diagnosis Date  . Complication of anesthesia     ITCHING  . Infection     UTI WITH PREGNANCY  . Migraine     MIGRAINES  . Yeast infection   . H/O varicella      Past Surgical History  Procedure Laterality Date  . Cesarean section    . Tonsillectomy  AGE 2  . Wisdom tooth extraction  AGE 50    Family History  Problem Relation Age of Onset  . Rheum arthritis Father   . Birth defects Son     HAS ONLY ONE KIDNEY  . Miscarriages / Stillbirths Maternal Grandmother   . Parkinsonism Maternal Grandmother     Social History  Substance Use Topics  . Smoking status: Never Smoker   . Smokeless tobacco: Never Used  . Alcohol Use: No    Allergies:  Allergies  Allergen Reactions  . Sulfa Antibiotics Swelling and Rash  . Amoxicillin Hives and Itching  . Erythromycin Hives and Itching  . Penicillins Hives    Prescriptions prior to admission  Medication Sig Dispense Refill Last Dose  . calcium carbonate (TUMS - DOSED IN MG ELEMENTAL CALCIUM) 500 MG chewable tablet Chew 3 tablets by mouth at bedtime.   01/05/2015 at Unknown time  . Docosahexaenoic Acid (PRENATAL DHA PO) Take 1 tablet by mouth at bedtime.   01/05/2015 at Unknown time  . ibuprofen (ADVIL,MOTRIN) 200 MG tablet Take 400 mg by mouth once. pain   01/06/2015 at Unknown time    ROS See HPI for pertinent details.    Physical Exam   Blood pressure 115/60, pulse 101,  temperature 98.7 F (37.1 C), temperature source Oral, resp. rate 18, height  (1.575 m), weight 211 lb (95.709 kg), SpO2 96 %.  Fetal heart sounds detected on doppler, however, nurse was unable to get fetal heart tracing on monitor 2/2 patient's position.  Physical Exam  General: Uncomfortable appearing female, bent at the waist while standing supporting upper body on bed.   Cardiovascular: increased tachycardia during exam MSK: Patient with moderate paraspinous tenderness in thoracic and lumbar spine.  Spasm of upper gluteus muscles bilaterally.  Patient become tearful with palpation of lower back.  No bony tenderness or obvious deformities.  Pulmonary: Normal work of breathing Abdomen: Gravid, obese  abdomen Psych: Appropriate mood and affect   MAU Course  Procedures  MDM Reviewed labs from [facility] ED including urine culture from 7/31- which showed pan-sensitive E.Coli.  Consulted with Dr. Tinnie Gens regarding management of recurrent UTI in pregnancy  Assessment and Plan  Sui Kasparek is a 34 y/o [phone] with PMH significant for back pain during prior pregnancies presenting with muscle spasm in lower back.  Patient requests to avoid using Flexeril as she did not like her reaction to the medication when using it in the plast, so plan is to control pain with IM morphine and acetaminophin in MAU and d/c with prescription for opioid pain medication to use sparingly over the next week.  Encouraged patient to follow-up both in OB/GYN clinic and with her chiropractor for massage.  Encouraged continued use of heat/ice, stretching and foam rolling if this is helpful.  #Paraspinal muscle spasm: -  IM morphine with benadryl if needed for itching -  acetaminophen -Oxycodone  for ineffective morphine subQ -heating pad -Rx for vicodin#20 provided  #Recurrent UTI, affecting pregnancy: has had now #3 diagnosed UTIs in this pregnancy. Medication regimen complicated by multiple abx allergies. Reviewed culture results available. No evidence of pyelonephritis on exam or vitals.  - Will treat with Keflex 500 QID for 7 days then start  daily for prophylaxis.   #Fetal well being: -unable to get full NST but this is not warranted. Reassured by FHR.   The above information was documented with assistance from Retta Mac, MS3.    Isa [facility] 01/06/2015, 8:51 PM   Addendum: Pt pain unchanged after opiates.  Pt standing, leaning over bed, tearful with pain.  Consult Dr Shawnie Pons.  Decadron 10 mg IM x 1 dose and prednisone 40 mg daily x 5 days.  Pt pain reduced slightly after steroid injection.    D/C home in stable condition to treat UTI, take steroids PO, and pain  medication PRN.    Discussed medications, including risks/benefits/alternatives with pt and her husband, questions answered.  Pt states understanding.    Return to MAU if no change in symptoms in 24 hours or sooner as needed for emergencies.  LEFTWICH-KIRBY, Ashaunte Standley, CNM 10:22 PM

## 2015-01-06 NOTE — MAU Note (Signed)
Patient presents at [redacted] weeks gestation with c/o that her back has gone out and does so with every pregnancy. Denies vagnal bleeding or discharge. Fetus active. Patient standing at side of bed with elbows on mattress. Attempt to monitor FHR unsuccessful but doppled in the RLQ.

## 2015-01-09 ENCOUNTER — Ambulatory Visit (INDEPENDENT_AMBULATORY_CARE_PROVIDER_SITE_OTHER): Payer: Self-pay | Admitting: Advanced Practice Midwife

## 2015-01-09 VITALS — BP 116/60 | HR 99 | Wt 213.0 lb

## 2015-01-09 DIAGNOSIS — Z3481 Encounter for supervision of other normal pregnancy, first trimester: Secondary | ICD-10-CM

## 2015-01-09 DIAGNOSIS — O26893 Other specified pregnancy related conditions, third trimester: Secondary | ICD-10-CM

## 2015-01-09 DIAGNOSIS — M549 Dorsalgia, unspecified: Secondary | ICD-10-CM

## 2015-01-09 DIAGNOSIS — O9989 Other specified diseases and conditions complicating pregnancy, childbirth and the puerperium: Secondary | ICD-10-CM

## 2015-01-09 DIAGNOSIS — O2343 Unspecified infection of urinary tract in pregnancy, third trimester: Secondary | ICD-10-CM

## 2015-01-09 DIAGNOSIS — Z3491 Encounter for supervision of normal pregnancy, unspecified, first trimester: Secondary | ICD-10-CM

## 2015-01-09 LAB — URINE CULTURE: Special Requests: NORMAL

## 2015-01-09 NOTE — Progress Notes (Signed)
Subjective:  Ellen Short is a 34 y.o. [phone] at [redacted]w[redacted]d being seen today for ongoing prenatal care.  Patient reports backache. Improved today after treatment in MAU started on Friday.  Contractions: Not present.  Vag. Bleeding: None. Movement: Present. Denies leaking of fluid.   The following portions of the patient's history were reviewed and updated as appropriate: allergies, current medications, past family history, past medical history, past social history, past surgical history and problem list.   Objective:   Filed Vitals:   01/09/15 1025  BP: 116/60  Pulse: 99  Weight: 213 lb (96.616 kg)    Fetal Status: Fetal Heart Rate (bpm): 145 (Simultaneous filing. User may not have seen previous data.) Fundal Height: 26 cm Movement: Present     General:  Alert, oriented and cooperative. Patient is in no acute distress.  Skin: Skin is warm and dry. No rash noted.   Cardiovascular: Normal heart rate noted  Respiratory: Normal respiratory effort, no problems with respiration noted  Abdomen: Soft, gravid, appropriate for gestational age. Pain/Pressure: Present     Pelvic: Vag. Bleeding: None Vag D/C Character: Thin   Cervical exam deferred        Extremities: Normal range of motion.  Edema: None  Mental Status: Normal mood and affect. Normal behavior. Normal judgment and thought content.   Urinalysis: Urine Protein: Trace Urine Glucose: Negative  Assessment and Plan:  Pregnancy: [id number] at [redacted]w[redacted]d  1. Recurrent UTI (urinary tract infection) complicating pregnancy, third trimester --Currently on Keflex treatment x 1 week then Keflex prophylaxis daily throughout pregnancy  2. Supervision of normal pregnancy in first trimester   3. Back pain affecting pregnancy in third trimester --Seen in MAU 8/19, given Percocet and Decadron. Started Keflex for UTI and started prednisone course.  Pt has chiropractic care that does help usual level of pain.  Preterm labor symptoms and general  obstetric precautions including but not limited to vaginal bleeding, contractions, leaking of fluid and fetal movement were reviewed in detail with the patient.  Message sent to schedule repeat C/S at 39 weeks.  Please refer to After Visit Summary for other counseling recommendations.  Return in about 4 weeks (around 02/06/2015).   Hurshel Party, CNM

## 2015-01-30 ENCOUNTER — Ambulatory Visit (INDEPENDENT_AMBULATORY_CARE_PROVIDER_SITE_OTHER): Payer: Self-pay | Admitting: Advanced Practice Midwife

## 2015-01-30 VITALS — BP 100/65 | HR 98 | Wt 207.0 lb

## 2015-01-30 DIAGNOSIS — Z23 Encounter for immunization: Secondary | ICD-10-CM

## 2015-01-30 DIAGNOSIS — Z3483 Encounter for supervision of other normal pregnancy, third trimester: Secondary | ICD-10-CM

## 2015-01-30 DIAGNOSIS — Z3492 Encounter for supervision of normal pregnancy, unspecified, second trimester: Secondary | ICD-10-CM

## 2015-01-30 NOTE — Progress Notes (Signed)
Subjective:  Kamala Kolton is a 34 y.o. [phone] at [redacted]w[redacted]d being seen today for ongoing prenatal care.  Patient reports fatigue.  Contractions: Not present.  Vag. Bleeding: None. Movement: Present. Denies leaking of fluid.   The following portions of the patient's history were reviewed and updated as appropriate: allergies, current medications, past family history, past medical history, past social history, past surgical history and problem list.   Objective:   Filed Vitals:   01/30/15 0959  BP: 100/65  Pulse: 98  Weight: 207 lb (93.895 kg)    Fetal Status: Fetal Heart Rate (bpm): 133   Movement: Present     General:  Alert, oriented and cooperative. Patient is in no acute distress.  Skin: Skin is warm and dry. No rash noted.   Cardiovascular: Normal heart rate noted  Respiratory: Normal respiratory effort, no problems with respiration noted  Abdomen: Soft, gravid, appropriate for gestational age. Pain/Pressure: Present     Pelvic: Vag. Bleeding: None Vag D/C Character: Thin   Cervical exam deferred        Extremities: Normal range of motion.  Edema: None  Mental Status: Normal mood and affect. Normal behavior. Normal judgment and thought content.   Urinalysis: Urine Protein: Trace Urine Glucose: Negative  Assessment and Plan:  Pregnancy: [id number] at [redacted]w[redacted]d  1. Normal pregnancy, second trimester  - CBC - Glucose Tolerance, 1 HR (50g) - HIV antibody - RPR - Tdap vaccine greater than or equal to 7yo IM - Flu Vaccine QUAD 36+ mos IM (Fluarix, Quad PF)  Preterm labor symptoms and general obstetric precautions including but not limited to vaginal bleeding, contractions, leaking of fluid and fetal movement were reviewed in detail with the patient. Please refer to After Visit Summary for other counseling recommendations.  No Follow-up on file.   Hurshel Party, CNM

## 2015-01-31 LAB — CBC
HCT: 32.7 % — ABNORMAL LOW (ref 36.0–46.0)
Hemoglobin: 11 g/dL — ABNORMAL LOW (ref 12.0–15.0)
MCH: 31 pg (ref 26.0–34.0)
MCHC: 33.6 g/dL (ref 30.0–36.0)
MCV: 92.1 fL (ref 78.0–100.0)
MPV: 9.7 fL (ref 8.6–12.4)
PLATELETS: 269 10*3/uL (ref 150–400)
RBC: 3.55 MIL/uL — ABNORMAL LOW (ref 3.87–5.11)
RDW: 14.7 % (ref 11.5–15.5)
WBC: 9.7 10*3/uL (ref 4.0–10.5)

## 2015-02-01 LAB — GLUCOSE TOLERANCE, 1 HOUR (50G) W/O FASTING: GLUCOSE 1 HOUR GTT: 114 mg/dL (ref 70–140)

## 2015-02-01 LAB — RPR

## 2015-02-01 LAB — HIV ANTIBODY (ROUTINE TESTING W REFLEX): HIV 1&2 Ab, 4th Generation: NONREACTIVE

## 2015-02-13 ENCOUNTER — Ambulatory Visit (INDEPENDENT_AMBULATORY_CARE_PROVIDER_SITE_OTHER): Payer: Self-pay | Admitting: Advanced Practice Midwife

## 2015-02-13 VITALS — BP 104/54 | HR 93 | Wt 210.0 lb

## 2015-02-13 DIAGNOSIS — Z3481 Encounter for supervision of other normal pregnancy, first trimester: Secondary | ICD-10-CM

## 2015-02-13 DIAGNOSIS — Z3491 Encounter for supervision of normal pregnancy, unspecified, first trimester: Secondary | ICD-10-CM

## 2015-02-13 DIAGNOSIS — O4703 False labor before 37 completed weeks of gestation, third trimester: Secondary | ICD-10-CM

## 2015-02-13 NOTE — Progress Notes (Signed)
Pt concerned about increase BH contractions

## 2015-02-13 NOTE — Patient Instructions (Signed)

## 2015-02-14 ENCOUNTER — Inpatient Hospital Stay (HOSPITAL_COMMUNITY)
Admission: AD | Admit: 2015-02-14 | Discharge: 2015-02-14 | Disposition: A | Discharge status: Home / Self Care | Payer: Self-pay | Source: Ambulatory Visit | Attending: Obstetrics and Gynecology | Admitting: Obstetrics and Gynecology

## 2015-02-14 ENCOUNTER — Encounter (HOSPITAL_COMMUNITY): Payer: Self-pay | Admitting: *Deleted

## 2015-02-14 DIAGNOSIS — Z881 Allergy status to other antibiotic agents status: Secondary | ICD-10-CM | POA: Insufficient documentation

## 2015-02-14 DIAGNOSIS — Z3A30 30 weeks gestation of pregnancy: Secondary | ICD-10-CM | POA: Insufficient documentation

## 2015-02-14 DIAGNOSIS — O26893 Other specified pregnancy related conditions, third trimester: Secondary | ICD-10-CM | POA: Insufficient documentation

## 2015-02-14 DIAGNOSIS — N898 Other specified noninflammatory disorders of vagina: Secondary | ICD-10-CM | POA: Insufficient documentation

## 2015-02-14 DIAGNOSIS — R109 Unspecified abdominal pain: Secondary | ICD-10-CM | POA: Insufficient documentation

## 2015-02-14 DIAGNOSIS — O4703 False labor before 37 completed weeks of gestation, third trimester: Secondary | ICD-10-CM

## 2015-02-14 DIAGNOSIS — O479 False labor, unspecified: Secondary | ICD-10-CM

## 2015-02-14 LAB — URINE MICROSCOPIC-ADD ON

## 2015-02-14 LAB — URINALYSIS, ROUTINE W REFLEX MICROSCOPIC
Bilirubin Urine: NEGATIVE
GLUCOSE, UA: NEGATIVE mg/dL
KETONES UR: 15 mg/dL — AB
LEUKOCYTES UA: NEGATIVE
Nitrite: NEGATIVE
PROTEIN: NEGATIVE mg/dL
Specific Gravity, Urine: >1.03 — ABNORMAL HIGH (ref 1.005–1.030)
UROBILINOGEN UA: 0.2 mg/dL (ref 0.0–1.0)
pH: 6 (ref 5.0–8.0)

## 2015-02-14 NOTE — MAU Note (Signed)
Patient being seen for abdominal cramping and brown discharge today. No vaginal bleeding. + fetal movement. Patient currently being treated for recurrent UTIs. No urinary s/s.

## 2015-02-14 NOTE — Discharge Instructions (Signed)
Braxton Hicks Contractions °Contractions of the uterus can occur throughout pregnancy. Contractions are not always a sign that you are in labor.  °WHAT ARE BRAXTON HICKS CONTRACTIONS?  °Contractions that occur before labor are called Braxton Hicks contractions, or false labor. Toward the end of pregnancy (32-34 weeks), these contractions can develop more often and may become more forceful. This is not true labor because these contractions do not result in opening (dilatation) and thinning of the cervix. They are sometimes difficult to tell apart from true labor because these contractions can be forceful and people have different pain tolerances. You should not feel embarrassed if you go to the hospital with false labor. Sometimes, the only way to tell if you are in true labor is for your health care provider to look for changes in the cervix. °If there are no prenatal problems or other health problems associated with the pregnancy, it is completely safe to be sent home with false labor and await the onset of true labor. °HOW CAN YOU TELL THE DIFFERENCE BETWEEN TRUE AND FALSE LABOR? °False Labor °· The contractions of false labor are usually shorter and not as hard as those of true labor.   °· The contractions are usually irregular.   °· The contractions are often felt in the front of the lower abdomen and in the groin.   °· The contractions may go away when you walk around or change positions while lying down.   °· The contractions get weaker and are shorter lasting as time goes on.   °· The contractions do not usually become progressively stronger, regular, and closer together as with true labor.   °True Labor °· Contractions in true labor last 30-70 seconds, become very regular, usually become more intense, and increase in frequency.   °· The contractions do not go away with walking.   °· The discomfort is usually felt in the top of the uterus and spreads to the lower abdomen and low back.   °· True labor can be  determined by your health care provider with an exam. This will show that the cervix is dilating and getting thinner.   °WHAT TO REMEMBER °· Keep up with your usual exercises and follow other instructions given by your health care provider.   °· Take medicines as directed by your health care provider.   °· Keep your regular prenatal appointments.   °· Eat and drink lightly if you think you are going into labor.   °· If Braxton Hicks contractions are making you uncomfortable:   °¨ Change your position from lying down or resting to walking, or from walking to resting.   °¨ Sit and rest in a tub of warm water.   °¨ Drink 2-3 glasses of water. Dehydration may cause these contractions.   °¨ Do slow and deep breathing several times an hour.   °WHEN SHOULD I SEEK IMMEDIATE MEDICAL CARE? °Seek immediate medical care if: °· Your contractions become stronger, more regular, and closer together.   °· You have fluid leaking or gushing from your vagina.   °· You have a fever.   °· You pass blood-tinged mucus.   °· You have vaginal bleeding.   °· You have continuous abdominal pain.   °· You have low back pain that you never had before.   °· You feel your baby's head pushing down and causing pelvic pressure.   °· Your baby is not moving as much as it used to.   °Document Released: 05/06/2005 Document Revised: 05/11/2013 Document Reviewed: 02/15/2013 °[employer]® Patient Information ©2015 [employer]. This information is not intended to replace advice given to you by your health care   provider. Make sure you discuss any questions you have with your health care provider. ° °

## 2015-02-14 NOTE — MAU Provider Note (Signed)
History     CSN: [id number]  Arrival date and time: 02/14/15 4098   First Provider Initiated Contact with Patient 02/14/15 2016      Chief Complaint  Patient presents with  . Vaginal Discharge   HPI Ms. Ellen Short is a 34 y.o. [phone] at [redacted]w[redacted]d who presents to MAU today with complaint of abdominal cramping. The patient states that she has had intermittent abdominal cramping since last Friday. She was seen in the office yesterday and per her report cervix was closed. She noted a large "glob of mucus" with some brownish tint today with wiping. She has not had bright red vaginal bleeding or LOF. She reports good fetal movement. She feels that she may be having Braxton Hicks contractions occasionally, but they are not very painful and are irregular. She does endorse one loose stool this morning. She also states that she has not been drinking as much water today as previously. She denies complications with the pregnancy aside from recurrent UTIs for which she is taking Keflex daily for suppression.   OB History    Gravida Para Term Preterm AB TAB SAB Ectopic Multiple Living   0 4 0 4 0 0 4      Past Medical History  Diagnosis Date  . Complication of anesthesia     ITCHING  . Infection     UTI WITH PREGNANCY  . Migraine     MIGRAINES  . Yeast infection   . H/O varicella     Past Surgical History  Procedure Laterality Date  . Cesarean section    . Tonsillectomy  AGE 66  . Wisdom tooth extraction  AGE 33    Family History  Problem Relation Age of Onset  . Rheum arthritis Father   . Birth defects Son     HAS ONLY ONE KIDNEY  . Miscarriages / Stillbirths Maternal Grandmother   . Parkinsonism Maternal Grandmother     Social History  Substance Use Topics  . Smoking status: Never Smoker   . Smokeless tobacco: Never Used  . Alcohol Use: No    Allergies:  Allergies  Allergen Reactions  . Sulfa Antibiotics Swelling and Rash  . Amoxicillin Hives and Itching   . Erythromycin Hives and Itching  . Penicillins Hives    Has patient had a PCN reaction causing immediate rash, facial/tongue/throat swelling, SOB or lightheadedness with hypotension: No Has patient had a PCN reaction causing severe rash involving mucus membranes or skin necrosis: No Has patient had a PCN reaction that required hospitalization No Has patient had a PCN reaction occurring within the last 10 years: No If all of the above answers are "NO", then may proceed with Cephalosporin use.     No prescriptions prior to admission    Review of Systems  Constitutional: Negative for malaise/fatigue.  Gastrointestinal: Positive for abdominal pain. Negative for nausea, vomiting, diarrhea and constipation.  Genitourinary: Negative for dysuria, urgency and frequency.       Neg - vaginal bleeding, LOF + mucus discharge   Physical Exam   Blood pressure 121/65, pulse 101, temperature 98.6 F (37 C), temperature source Oral, resp. rate 18, SpO2 100 %.  Physical Exam  Nursing note and vitals reviewed. Constitutional: She is oriented to person, place, and time. She appears well-developed and well-nourished. No distress.  HENT:  Head: Normocephalic and atraumatic.  Cardiovascular: Normal rate.   Respiratory: Effort normal.  GI: Soft. She exhibits no distension and no mass. There is no tenderness.  There is no rebound and no guarding.  Neurological: She is alert and oriented to person, place, and time.  Skin: Skin is warm and dry. No erythema.  Psychiatric: She has a normal mood and affect.  Dilation: Closed Effacement (%): Thick Cervical Position: Posterior Exam by:: Harlon Flor PA-C  Results for orders placed or performed during the hospital encounter of 02/14/15 (from the past 24 hour(s))  Urinalysis, Routine w reflex microscopic (not at [facility])     Status: Abnormal   Collection Time: 02/14/15  7:43 PM  Result Value Ref Range   Color, Urine YELLOW YELLOW   APPearance CLEAR CLEAR    Specific Gravity, Urine >1.030 (H) 1.005 - 1.030   pH 6.0 5.0 - 8.0   Glucose, UA NEGATIVE NEGATIVE mg/dL   Hgb urine dipstick SMALL (A) NEGATIVE   Bilirubin Urine NEGATIVE NEGATIVE   Ketones, ur 15 (A) NEGATIVE mg/dL   Protein, ur NEGATIVE NEGATIVE mg/dL   Urobilinogen, UA 0.2 0.0 - 1.0 mg/dL   Nitrite NEGATIVE NEGATIVE   Leukocytes, UA NEGATIVE NEGATIVE  Urine microscopic-add on     Status: Abnormal   Collection Time: 02/14/15  7:43 PM  Result Value Ref Range   Squamous Epithelial / LPF RARE RARE   WBC, UA 0-2 <3 WBC/hpf   RBC / HPF 0-2 <3 RBC/hpf   Bacteria, UA RARE RARE   Crystals CA OXALATE CRYSTALS (A) NEGATIVE   Urine-Other MUCOUS PRESENT     Fetal Monitoring: Baseline: 130 bpm, moderate variability, + accelerations, no decelerations Contractions: Moderate UI, mild after PO hydration  MAU Course  Procedures None  MDM UA today shows mild dehydration Patient encouraged to PO hydrate in MAU Unable to collect FFN as patient had cervical exam yesterday Assessment and Plan  A: SIUP at [redacted]w[redacted]d Vaginal discharge Abdominal pain in pregnancy  P: Discharge home Preterm labor precautions discussed Increased PO hydration advised as tolerated Patient advised to follow-up with [facility] as scheduled or sooner PRN Patient may return to MAU as needed or if her condition were to change or worsen  Marny Lowenstein, PA-C  02/14/2015, 9:25 PM

## 2015-02-16 NOTE — Progress Notes (Signed)
Subjective:  Ellen Short is a 34 y.o. [phone] at [redacted]w[redacted]d being seen today for ongoing prenatal care.  Patient reports increased braxot Hick contractions. No urinary complaints or GI complaints, VB, vaginal discharge or LOF. Contractions: Irritability.  Vag. Bleeding: None. Movement: Present. Denies leaking of fluid.   The following portions of the patient's history were reviewed and updated as appropriate: allergies, current medications, past family history, past medical history, past social history, past surgical history and problem list.   Objective:   Filed Vitals:   02/13/15 1117  BP: 104/54  Pulse: 93  Weight: 210 lb (95.255 kg)    Fetal Status: Fetal Heart Rate (bpm): 136 Fundal Height: 34 cm Movement: Present     General:  Alert, oriented and cooperative. Patient is in no acute distress.  Skin: Skin is warm and dry. No rash noted.   Cardiovascular: Normal heart rate noted  Respiratory: Normal respiratory effort, no problems with respiration noted  Abdomen: Soft, gravid, appropriate for gestational age. Pain/Pressure: Present     Pelvic: Vag. Bleeding: None Vag D/C Character: Thin   Cervical exam performed      0/0/ballotable  Extremities: Normal range of motion.  Edema: Trace  Mental Status: Normal mood and affect. Normal behavior. Normal judgment and thought content.   Urinalysis: Urine Protein: Trace Urine Glucose: Negative  Assessment and Plan:  Pregnancy: [id number] at [redacted]w[redacted]d  1. Supervision of normal pregnancy in first trimester  2. Preterm contractions -fFN collected, but discarded due to long, closed cervix.   Preterm labor symptoms and general obstetric precautions including but not limited to vaginal bleeding, contractions, leaking of fluid and fetal movement were reviewed in detail with the patient. Please refer to After Visit Summary for other counseling recommendations.  Return in about 2 weeks (around 02/27/2015).   Dorathy Kinsman, CNM

## 2015-02-27 ENCOUNTER — Encounter: Payer: Self-pay | Admitting: Family Medicine

## 2015-02-27 ENCOUNTER — Encounter: Payer: Self-pay | Admitting: Family

## 2015-02-27 ENCOUNTER — Ambulatory Visit (INDEPENDENT_AMBULATORY_CARE_PROVIDER_SITE_OTHER): Payer: Self-pay | Admitting: Family Medicine

## 2015-02-27 VITALS — BP 117/60 | HR 103 | Wt 213.0 lb

## 2015-02-27 DIAGNOSIS — Z3483 Encounter for supervision of other normal pregnancy, third trimester: Secondary | ICD-10-CM

## 2015-02-27 DIAGNOSIS — Z3493 Encounter for supervision of normal pregnancy, unspecified, third trimester: Secondary | ICD-10-CM

## 2015-02-27 DIAGNOSIS — O34219 Maternal care for unspecified type scar from previous cesarean delivery: Secondary | ICD-10-CM

## 2015-02-27 NOTE — Progress Notes (Signed)
Subjective:  Ellen Short is a 34 y.o. [phone] at [redacted]w[redacted]d being seen today for ongoing prenatal care.  Patient reports backache and contractions since 3 wks ago, but not regular.  Contractions: Irritability.  Vag. Bleeding: None. Movement: Present. Denies leaking of fluid.   The following portions of the patient's history were reviewed and updated as appropriate: allergies, current medications, past family history, past medical history, past social history, past surgical history and problem list.   Objective:   Filed Vitals:   02/27/15 1514  BP: 117/60  Pulse: 103  Weight: 213 lb (96.616 kg)    Fetal Status: Fetal Heart Rate (bpm): 134 Fundal Height: 33 cm Movement: Present     General:  Alert, oriented and cooperative. Patient is in no acute distress.  Skin: Skin is warm and dry. No rash noted.   Cardiovascular: Normal heart rate noted  Respiratory: Normal respiratory effort, no problems with respiration noted  Abdomen: Soft, gravid, appropriate for gestational age. Pain/Pressure: Present     Pelvic: Vag. Bleeding: None Vag D/C Character: Thin   Cervical exam deferred        Extremities: Normal range of motion.  Edema: Trace  Mental Status: Normal mood and affect. Normal behavior. Normal judgment and thought content.   Urinalysis: Urine Protein: Trace Urine Glucose: Negative  Assessment and Plan:  Pregnancy: [id number] at [redacted]w[redacted]d  1. Supervision of normal pregnancy, third trimester Continue routine prenatal care.   2. Previous cesarean delivery affecting pregnancy, antepartum Scheduled for RCS  Preterm labor symptoms and general obstetric precautions including but not limited to vaginal bleeding, contractions, leaking of fluid and fetal movement were reviewed in detail with the patient. Please refer to After Visit Summary for other counseling recommendations.  Return in 2 weeks (on 03/13/2015).   Reva Bores, MD

## 2015-02-27 NOTE — Patient Instructions (Signed)
Third Trimester of Pregnancy The third trimester is from week 29 through week 42, months 7 through 9. The third trimester is a time when the fetus is growing rapidly. At the end of the ninth month, the fetus is about 20 inches in length and weighs 6-10 pounds.  BODY CHANGES Your body goes through many changes during pregnancy. The changes vary from woman to woman.   Your weight will continue to increase. You can expect to gain 25-35 pounds (11-16 kg) by the end of the pregnancy.  You may begin to get stretch marks on your hips, abdomen, and breasts.  You may urinate more often because the fetus is moving lower into your pelvis and pressing on your bladder.  You may develop or continue to have heartburn as a result of your pregnancy.  You may develop constipation because certain hormones are causing the muscles that push waste through your intestines to slow down.  You may develop hemorrhoids or swollen, bulging veins (varicose veins).  You may have pelvic pain because of the weight gain and pregnancy hormones relaxing your joints between the bones in your pelvis. Backaches may result from overexertion of the muscles supporting your posture.  You may have changes in your hair. These can include thickening of your hair, rapid growth, and changes in texture. Some women also have hair loss during or after pregnancy, or hair that feels dry or thin. Your hair will most likely return to normal after your baby is born.  Your breasts will continue to grow and be tender. A yellow discharge may leak from your breasts called colostrum.  Your belly button may stick out.  You may feel short of breath because of your expanding uterus.  You may notice the fetus "dropping," or moving lower in your abdomen.  You may have a bloody mucus discharge. This usually occurs a few days to a week before labor begins.  Your cervix becomes thin and soft (effaced) near your due date. WHAT TO EXPECT AT YOUR  PRENATAL EXAMS  You will have prenatal exams every 2 weeks until week 36. Then, you will have weekly prenatal exams. During a routine prenatal visit:  You will be weighed to make sure you and the fetus are growing normally.  Your blood pressure is taken.  Your abdomen will be measured to track your baby's growth.  The fetal heartbeat will be listened to.  Any test results from the previous visit will be discussed.  You may have a cervical check near your due date to see if you have effaced. At around 36 weeks, your caregiver will check your cervix. At the same time, your caregiver will also perform a test on the secretions of the vaginal tissue. This test is to determine if a type of bacteria, Group B streptococcus, is present. Your caregiver will explain this further. Your caregiver may ask you:  What your birth plan is.  How you are feeling.  If you are feeling the baby move.  If you have had any abnormal symptoms, such as leaking fluid, bleeding, severe headaches, or abdominal cramping.  If you are using any tobacco products, including cigarettes, chewing tobacco, and electronic cigarettes.  If you have any questions. Other tests or screenings that may be performed during your third trimester include:  Blood tests that check for low iron levels (anemia).  Fetal testing to check the health, activity level, and growth of the fetus. Testing is done if you have certain medical conditions or if   there are problems during the pregnancy.  HIV (human immunodeficiency virus) testing. If you are at high risk, you may be screened for HIV during your third trimester of pregnancy. FALSE LABOR You may feel small, irregular contractions that eventually go away. These are called Braxton Hicks contractions, or false labor. Contractions may last for hours, days, or even weeks before true labor sets in. If contractions come at regular intervals, intensify, or become painful, it is best to be seen  by your caregiver.  SIGNS OF LABOR   Menstrual-like cramps.  Contractions that are 5 minutes apart or less.  Contractions that start on the top of the uterus and spread down to the lower abdomen and back.  A sense of increased pelvic pressure or back pain.  A watery or bloody mucus discharge that comes from the vagina. If you have any of these signs before the 37th week of pregnancy, call your caregiver right away. You need to go to the hospital to get checked immediately. HOME CARE INSTRUCTIONS   Avoid all smoking, herbs, alcohol, and unprescribed drugs. These chemicals affect the formation and growth of the baby.  Do not use any tobacco products, including cigarettes, chewing tobacco, and electronic cigarettes. If you need help quitting, ask your health care provider. You may receive counseling support and other resources to help you quit.  Follow your caregiver's instructions regarding medicine use. There are medicines that are either safe or unsafe to take during pregnancy.  Exercise only as directed by your caregiver. Experiencing uterine cramps is a good sign to stop exercising.  Continue to eat regular, healthy meals.  Wear a good support bra for breast tenderness.  Do not use hot tubs, steam rooms, or saunas.  Wear your seat belt at all times when driving.  Avoid raw meat, uncooked cheese, cat litter boxes, and soil used by cats. These carry germs that can cause birth defects in the baby.  Take your prenatal vitamins.  Take 1500-2000 mg of calcium daily starting at the 20th week of pregnancy until you deliver your baby.  Try taking a stool softener (if your caregiver approves) if you develop constipation. Eat more high-fiber foods, such as fresh vegetables or fruit and whole grains. Drink plenty of fluids to keep your urine clear or pale yellow.  Take warm sitz baths to soothe any pain or discomfort caused by hemorrhoids. Use hemorrhoid cream if your caregiver  approves.  If you develop varicose veins, wear support hose. Elevate your feet for 15 minutes, 3-4 times a day. Limit salt in your diet.  Avoid heavy lifting, wear low heal shoes, and practice good posture.  Rest a lot with your legs elevated if you have leg cramps or low back pain.  Visit your dentist if you have not gone during your pregnancy. Use a soft toothbrush to brush your teeth and be gentle when you floss.  A sexual relationship may be continued unless your caregiver directs you otherwise.  Do not travel far distances unless it is absolutely necessary and only with the approval of your caregiver.  Take prenatal classes to understand, practice, and ask questions about the labor and delivery.  Make a trial run to the hospital.  Pack your hospital bag.  Prepare the baby's nursery.  Continue to go to all your prenatal visits as directed by your caregiver. SEEK MEDICAL CARE IF:  You are unsure if you are in labor or if your water has broken.  You have dizziness.  You have   mild pelvic cramps, pelvic pressure, or nagging pain in your abdominal area.  You have persistent nausea, vomiting, or diarrhea.  You have a bad smelling vaginal discharge.  You have pain with urination. SEEK IMMEDIATE MEDICAL CARE IF:   You have a fever.  You are leaking fluid from your vagina.  You have spotting or bleeding from your vagina.  You have severe abdominal cramping or pain.  You have rapid weight loss or gain.  You have shortness of breath with chest pain.  You notice sudden or extreme swelling of your face, hands, ankles, feet, or legs.  You have not felt your baby move in over an hour.  You have severe headaches that do not go away with medicine.  You have vision changes.   This information is not intended to replace advice given to you by your health care provider. Make sure you discuss any questions you have with your health care provider.   Document Released:  04/30/2001 Document Revised: 05/27/2014 Document Reviewed: 07/07/2012 [employer] Interactive Patient Education 2016 [employer]  Breastfeeding Deciding to breastfeed is one of the best choices you can make for you and your baby. A change in hormones during pregnancy causes your breast tissue to grow and increases the number and size of your milk ducts. These hormones also allow proteins, sugars, and fats from your blood supply to make breast milk in your milk-producing glands. Hormones prevent breast milk from being released before your baby is born as well as prompt milk flow after birth. Once breastfeeding has begun, thoughts of your baby, as well as his or her sucking or crying, can stimulate the release of milk from your milk-producing glands.  BENEFITS OF BREASTFEEDING For Your Baby  Your first milk (colostrum) helps your baby's digestive system function better.  There are antibodies in your milk that help your baby fight off infections.  Your baby has a lower incidence of asthma, allergies, and sudden infant death syndrome.  The nutrients in breast milk are better for your baby than infant formulas and are designed uniquely for your baby's needs.  Breast milk improves your baby's brain development.  Your baby is less likely to develop other conditions, such as childhood obesity, asthma, or type 2 diabetes mellitus. For You  Breastfeeding helps to create a very special bond between you and your baby.  Breastfeeding is convenient. Breast milk is always available at the correct temperature and costs nothing.  Breastfeeding helps to burn calories and helps you lose the weight gained during pregnancy.  Breastfeeding makes your uterus contract to its prepregnancy size faster and slows bleeding (lochia) after you give birth.   Breastfeeding helps to lower your risk of developing type 2 diabetes mellitus, osteoporosis, and breast or ovarian cancer later in life. SIGNS THAT YOUR BABY IS  HUNGRY Early Signs of Hunger  Increased alertness or activity.  Stretching.  Movement of the head from side to side.  Movement of the head and opening of the mouth when the corner of the mouth or cheek is stroked (rooting).  Increased sucking sounds, smacking lips, cooing, sighing, or squeaking.  Hand-to-mouth movements.  Increased sucking of fingers or hands. Late Signs of Hunger  Fussing.  Intermittent crying. Extreme Signs of Hunger Signs of extreme hunger will require calming and consoling before your baby will be able to breastfeed successfully. Do not wait for the following signs of extreme hunger to occur before you initiate breastfeeding:  Restlessness.  A loud, strong cry.  Screaming.   BREASTFEEDING BASICS Breastfeeding Initiation  Find a comfortable place to sit or lie down, with your neck and back well supported.  Place a pillow or rolled up blanket under your baby to bring him or her to the level of your breast (if you are seated). Nursing pillows are specially designed to help support your arms and your baby while you breastfeed.  Make sure that your baby's abdomen is facing your abdomen.  Gently massage your breast. With your fingertips, massage from your chest wall toward your nipple in a circular motion. This encourages milk flow. You may need to continue this action during the feeding if your milk flows slowly.  Support your breast with 4 fingers underneath and your thumb above your nipple. Make sure your fingers are well away from your nipple and your baby's mouth.  Stroke your baby's lips gently with your finger or nipple.  When your baby's mouth is open wide enough, quickly bring your baby to your breast, placing your entire nipple and as much of the colored area around your nipple (areola) as possible into your baby's mouth.  More areola should be visible above your baby's upper lip than below the lower lip.  Your baby's tongue should be between his  or her lower gum and your breast.  Ensure that your baby's mouth is correctly positioned around your nipple (latched). Your baby's lips should create a seal on your breast and be turned out (everted).  It is common for your baby to suck about 2-3 minutes in order to start the flow of breast milk. Latching Teaching your baby how to latch on to your breast properly is very important. An improper latch can cause nipple pain and decreased milk supply for you and poor weight gain in your baby. Also, if your baby is not latched onto your nipple properly, he or she may swallow some air during feeding. This can make your baby fussy. Burping your baby when you switch breasts during the feeding can help to get rid of the air. However, teaching your baby to latch on properly is still the best way to prevent fussiness from swallowing air while breastfeeding. Signs that your baby has successfully latched on to your nipple:  Silent tugging or silent sucking, without causing you pain.  Swallowing heard between every 3-4 sucks.  Muscle movement above and in front of his or her ears while sucking. Signs that your baby has not successfully latched on to nipple:  Sucking sounds or smacking sounds from your baby while breastfeeding.  Nipple pain. If you think your baby has not latched on correctly, slip your finger into the corner of your baby's mouth to break the suction and place it between your baby's gums. Attempt breastfeeding initiation again. Signs of Successful Breastfeeding Signs from your baby:  A gradual decrease in the number of sucks or complete cessation of sucking.  Falling asleep.  Relaxation of his or her body.  Retention of a small amount of milk in his or her mouth.  Letting go of your breast by himself or herself. Signs from you:  Breasts that have increased in firmness, weight, and size 1-3 hours after feeding.  Breasts that are softer immediately after  breastfeeding.  Increased milk volume, as well as a change in milk consistency and color by the fifth day of breastfeeding.  Nipples that are not sore, cracked, or bleeding. Signs That Your Baby is Getting Enough Milk  Wetting at least 3 diapers in a 24-hour period.   The urine should be clear and pale yellow by age 5 days.  At least 3 stools in a 24-hour period by age 5 days. The stool should be soft and yellow.  At least 3 stools in a 24-hour period by age 7 days. The stool should be seedy and yellow.  No loss of weight greater than 10% of birth weight during the first 3 days of age.  Average weight gain of 4-7 ounces (113-198 g) per week after age 4 days.  Consistent daily weight gain by age 5 days, without weight loss after the age of 2 weeks. After a feeding, your baby may spit up a small amount. This is common. BREASTFEEDING FREQUENCY AND DURATION Frequent feeding will help you make more milk and can prevent sore nipples and breast engorgement. Breastfeed when you feel the need to reduce the fullness of your breasts or when your baby shows signs of hunger. This is called "breastfeeding on demand." Avoid introducing a pacifier to your baby while you are working to establish breastfeeding (the first 4-6 weeks after your baby is born). After this time you may choose to use a pacifier. Research has shown that pacifier use during the first year of a baby's life decreases the risk of sudden infant death syndrome (SIDS). Allow your baby to feed on each breast as long as he or she wants. Breastfeed until your baby is finished feeding. When your baby unlatches or falls asleep while feeding from the first breast, offer the second breast. Because newborns are often sleepy in the first few weeks of life, you may need to awaken your baby to get him or her to feed. Breastfeeding times will vary from baby to baby. However, the following rules can serve as a guide to help you ensure that your baby is  properly fed:  Newborns (babies 4 weeks of age or younger) may breastfeed every 1-3 hours.  Newborns should not go longer than 3 hours during the day or 5 hours during the night without breastfeeding.  You should breastfeed your baby a minimum of 8 times in a 24-hour period until you begin to introduce solid foods to your baby at around 6 months of age. BREAST MILK PUMPING Pumping and storing breast milk allows you to ensure that your baby is exclusively fed your breast milk, even at times when you are unable to breastfeed. This is especially important if you are going back to work while you are still breastfeeding or when you are not able to be present during feedings. Your lactation consultant can give you guidelines on how long it is safe to store breast milk. A breast pump is a machine that allows you to pump milk from your breast into a sterile bottle. The pumped breast milk can then be stored in a refrigerator or freezer. Some breast pumps are operated by hand, while others use electricity. Ask your lactation consultant which type will work best for you. Breast pumps can be purchased, but some hospitals and breastfeeding support groups lease breast pumps on a monthly basis. A lactation consultant can teach you how to hand express breast milk, if you prefer not to use a pump. CARING FOR YOUR BREASTS WHILE YOU BREASTFEED Nipples can become dry, cracked, and sore while breastfeeding. The following recommendations can help keep your breasts moisturized and healthy:  Avoid using soap on your nipples.  Wear a supportive bra. Although not required, special nursing bras and tank tops are designed to allow access to your   breasts for breastfeeding without taking off your entire bra or top. Avoid wearing underwire-style bras or extremely tight bras.  Air dry your nipples for 3-4minutes after each feeding.  Use only cotton bra pads to absorb leaked breast milk. Leaking of breast milk between feedings  is normal.  Use lanolin on your nipples after breastfeeding. Lanolin helps to maintain your skin's normal moisture barrier. If you use pure lanolin, you do not need to wash it off before feeding your baby again. Pure lanolin is not toxic to your baby. You may also hand express a few drops of breast milk and gently massage that milk into your nipples and allow the milk to air dry. In the first few weeks after giving birth, some women experience extremely full breasts (engorgement). Engorgement can make your breasts feel heavy, warm, and tender to the touch. Engorgement peaks within 3-5 days after you give birth. The following recommendations can help ease engorgement:  Completely empty your breasts while breastfeeding or pumping. You may want to start by applying warm, moist heat (in the shower or with warm water-soaked hand towels) just before feeding or pumping. This increases circulation and helps the milk flow. If your baby does not completely empty your breasts while breastfeeding, pump any extra milk after he or she is finished.  Wear a snug bra (nursing or regular) or tank top for 1-2 days to signal your body to slightly decrease milk production.  Apply ice packs to your breasts, unless this is too uncomfortable for you.  Make sure that your baby is latched on and positioned properly while breastfeeding. If engorgement persists after 48 hours of following these recommendations, contact your health care provider or a lactation consultant. OVERALL HEALTH CARE RECOMMENDATIONS WHILE BREASTFEEDING  Eat healthy foods. Alternate between meals and snacks, eating 3 of each per day. Because what you eat affects your breast milk, some of the foods may make your baby more irritable than usual. Avoid eating these foods if you are sure that they are negatively affecting your baby.  Drink milk, fruit juice, and water to satisfy your thirst (about 10 glasses a day).  Rest often, relax, and continue to take  your prenatal vitamins to prevent fatigue, stress, and anemia.  Continue breast self-awareness checks.  Avoid chewing and smoking tobacco. Chemicals from cigarettes that pass into breast milk and exposure to secondhand smoke may harm your baby.  Avoid alcohol and drug use, including marijuana. Some medicines that may be harmful to your baby can pass through breast milk. It is important to ask your health care provider before taking any medicine, including all over-the-counter and prescription medicine as well as vitamin and herbal supplements. It is possible to become pregnant while breastfeeding. If birth control is desired, ask your health care provider about options that will be safe for your baby. SEEK MEDICAL CARE IF:  You feel like you want to stop breastfeeding or have become frustrated with breastfeeding.  You have painful breasts or nipples.  Your nipples are cracked or bleeding.  Your breasts are red, tender, or warm.  You have a swollen area on either breast.  You have a fever or chills.  You have nausea or vomiting.  You have drainage other than breast milk from your nipples.  Your breasts do not become full before feedings by the fifth day after you give birth.  You feel sad and depressed.  Your baby is too sleepy to eat well.  Your baby is having trouble sleeping.     Your baby is wetting less than 3 diapers in a 24-hour period.  Your baby has less than 3 stools in a 24-hour period.  Your baby's skin or the white part of his or her eyes becomes yellow.   Your baby is not gaining weight by 5 days of age. SEEK IMMEDIATE MEDICAL CARE IF:  Your baby is overly tired (lethargic) and does not want to wake up and feed.  Your baby develops an unexplained fever.   This information is not intended to replace advice given to you by your health care provider. Make sure you discuss any questions you have with your health care provider.   Document Released: 05/06/2005  Document Revised: 01/25/2015 Document Reviewed: 10/28/2012 [employer] Interactive Patient Education 2016 [employer]  

## 2015-03-07 ENCOUNTER — Encounter: Payer: Self-pay | Admitting: *Deleted

## 2015-03-13 ENCOUNTER — Ambulatory Visit (INDEPENDENT_AMBULATORY_CARE_PROVIDER_SITE_OTHER): Payer: Self-pay | Admitting: Advanced Practice Midwife

## 2015-03-13 VITALS — BP 102/63 | HR 100 | Wt 214.0 lb

## 2015-03-13 DIAGNOSIS — O9989 Other specified diseases and conditions complicating pregnancy, childbirth and the puerperium: Secondary | ICD-10-CM

## 2015-03-13 DIAGNOSIS — Z3483 Encounter for supervision of other normal pregnancy, third trimester: Secondary | ICD-10-CM

## 2015-03-13 DIAGNOSIS — O99891 Other specified diseases and conditions complicating pregnancy: Secondary | ICD-10-CM

## 2015-03-13 DIAGNOSIS — Z3493 Encounter for supervision of normal pregnancy, unspecified, third trimester: Secondary | ICD-10-CM

## 2015-03-13 DIAGNOSIS — O26893 Other specified pregnancy related conditions, third trimester: Secondary | ICD-10-CM

## 2015-03-13 DIAGNOSIS — O34219 Maternal care for unspecified type scar from previous cesarean delivery: Secondary | ICD-10-CM

## 2015-03-13 DIAGNOSIS — M549 Dorsalgia, unspecified: Secondary | ICD-10-CM

## 2015-03-13 NOTE — Patient Instructions (Signed)
Braxton Hicks Contractions °Contractions of the uterus can occur throughout pregnancy. Contractions are not always a sign that you are in labor.  °WHAT ARE BRAXTON HICKS CONTRACTIONS?  °Contractions that occur before labor are called Braxton Hicks contractions, or false labor. Toward the end of pregnancy (32-34 weeks), these contractions can develop more often and may become more forceful. This is not true labor because these contractions do not result in opening (dilatation) and thinning of the cervix. They are sometimes difficult to tell apart from true labor because these contractions can be forceful and people have different pain tolerances. You should not feel embarrassed if you go to the hospital with false labor. Sometimes, the only way to tell if you are in true labor is for your health care provider to look for changes in the cervix. °If there are no prenatal problems or other health problems associated with the pregnancy, it is completely safe to be sent home with false labor and await the onset of true labor. °HOW CAN YOU TELL THE DIFFERENCE BETWEEN TRUE AND FALSE LABOR? °False Labor °· The contractions of false labor are usually shorter and not as hard as those of true labor.   °· The contractions are usually irregular.   °· The contractions are often felt in the front of the lower abdomen and in the groin.   °· The contractions may go away when you walk around or change positions while lying down.   °· The contractions get weaker and are shorter lasting as time goes on.   °· The contractions do not usually become progressively stronger, regular, and closer together as with true labor.   °True Labor °· Contractions in true labor last 30-70 seconds, become very regular, usually become more intense, and increase in frequency.   °· The contractions do not go away with walking.   °· The discomfort is usually felt in the top of the uterus and spreads to the lower abdomen and low back.   °· True labor can be  determined by your health care provider with an exam. This will show that the cervix is dilating and getting thinner.   °WHAT TO REMEMBER °· Keep up with your usual exercises and follow other instructions given by your health care provider.   °· Take medicines as directed by your health care provider.   °· Keep your regular prenatal appointments.   °· Eat and drink lightly if you think you are going into labor.   °· If Braxton Hicks contractions are making you uncomfortable:   °¨ Change your position from lying down or resting to walking, or from walking to resting.   °¨ Sit and rest in a tub of warm water.   °¨ Drink 2-3 glasses of water. Dehydration may cause these contractions.   °¨ Do slow and deep breathing several times an hour.   °WHEN SHOULD I SEEK IMMEDIATE MEDICAL CARE? °Seek immediate medical care if: °· Your contractions become stronger, more regular, and closer together.   °· You have fluid leaking or gushing from your vagina.   °· You have a fever.   °· You pass blood-tinged mucus.   °· You have vaginal bleeding.   °· You have continuous abdominal pain.   °· You have low back pain that you never had before.   °· You feel your baby's head pushing down and causing pelvic pressure.   °· Your baby is not moving as much as it used to.   °  °This information is not intended to replace advice given to you by your health care provider. Make sure you discuss any questions you have with your health care   provider. °  °Document Released: 05/06/2005 Document Revised: 05/11/2013 Document Reviewed: 02/15/2013 °Elsevier Interactive Patient Education ©2016 [employer] ° °

## 2015-03-13 NOTE — Progress Notes (Signed)
  Subjective:  Elder NegusJennifer Wara-Goss is a 34 y.o. [phone] at 3161w5d being seen today for ongoing prenatal care.  Patient reports occasional contractions. 0-3 per hour. Contractions: Irritability.  Vag. Bleeding: None. Movement: Present. Denies leaking of fluid.   The following portions of the patient's history were reviewed and updated as appropriate: allergies, current medications, past family history, past medical history, past social history, past surgical history and problem list. Problem list updated.  Objective:   Filed Vitals:   03/13/15 1124  BP: 102/63  Pulse: 100  Weight: 214 lb (97.07 kg)    Fetal Status: Fetal Heart Rate (bpm): 134 Fundal Height: 36 cm Movement: Present     General:  Alert, oriented and cooperative. Patient is in no acute distress.  Skin: Skin is warm and dry. No rash noted.   Cardiovascular: Normal heart rate noted  Respiratory: Normal respiratory effort, no problems with respiration noted  Abdomen: Soft, gravid, appropriate for gestational age. Pain/Pressure: Present     Pelvic: Vag. Bleeding: None Vag D/C Character: Thin   Cervical exam deferred        Extremities: Normal range of motion.  Edema: Trace  Mental Status: Normal mood and affect. Normal behavior. Normal judgment and thought content.   Urinalysis: Urine Protein: Negative Urine Glucose: Negative  Assessment and Plan:  Pregnancy: [id number] at 2561w5d  There are no diagnoses linked to this encounter. Preterm labor symptoms and general obstetric precautions including but not limited to vaginal bleeding, contractions, leaking of fluid and fetal movement were reviewed in detail with the patient. Please refer to After Visit Summary for other counseling recommendations.  Return in about 2 weeks (around 03/27/2015).   Dorathy KinsmanVirginia Amalee Olsen, CNM

## 2015-03-27 ENCOUNTER — Ambulatory Visit (INDEPENDENT_AMBULATORY_CARE_PROVIDER_SITE_OTHER): Payer: Self-pay | Admitting: Advanced Practice Midwife

## 2015-03-27 VITALS — BP 119/73 | HR 102 | Wt 214.0 lb

## 2015-03-27 DIAGNOSIS — Z36 Encounter for antenatal screening of mother: Secondary | ICD-10-CM

## 2015-03-27 DIAGNOSIS — Z113 Encounter for screening for infections with a predominantly sexual mode of transmission: Secondary | ICD-10-CM

## 2015-03-27 DIAGNOSIS — Z3483 Encounter for supervision of other normal pregnancy, third trimester: Secondary | ICD-10-CM

## 2015-03-27 DIAGNOSIS — Z3493 Encounter for supervision of normal pregnancy, unspecified, third trimester: Secondary | ICD-10-CM

## 2015-03-27 NOTE — Patient Instructions (Signed)
Fetal Movement Counts  Patient Name: __________________________________________________ Patient Due Date: ____________________  Performing a fetal movement count is highly recommended in high-risk pregnancies, but it is good for every pregnant woman to do. Your health care provider may ask you to start counting fetal movements at 28 weeks of the pregnancy. Fetal movements often increase:  · After eating a full meal.  · After physical activity.  · After eating or drinking something sweet or cold.  · At rest.  Pay attention to when you feel the baby is most active. This will help you notice a pattern of your baby's sleep and wake cycles and what factors contribute to an increase in fetal movement. It is important to perform a fetal movement count at the same time each day when your baby is normally most active.   HOW TO COUNT FETAL MOVEMENTS  1. Find a quiet and comfortable area to sit or lie down on your left side. Lying on your left side provides the best blood and oxygen circulation to your baby.  2. Write down the day and time on a sheet of paper or in a journal.  3. Start counting kicks, flutters, swishes, rolls, or jabs in a 2-hour period. You should feel at least 10 movements within 2 hours.  4. If you do not feel 10 movements in 2 hours, wait 2-3 hours and count again. Look for a change in the pattern or not enough counts in 2 hours.  SEEK MEDICAL CARE IF:  · You feel less than 10 counts in 2 hours, tried twice.  · There is no movement in over an hour.  · The pattern is changing or taking longer each day to reach 10 counts in 2 hours.  · You feel the baby is not moving as he or she usually does.  Date: ____________ Movements: ____________ Start time: ____________ Finish time: ____________   Date: ____________ Movements: ____________ Start time: ____________ Finish time: ____________  Date: ____________ Movements: ____________ Start time: ____________ Finish time: ____________  Date: ____________ Movements:  ____________ Start time: ____________ Finish time: ____________  Date: ____________ Movements: ____________ Start time: ____________ Finish time: ____________  Date: ____________ Movements: ____________ Start time: ____________ Finish time: ____________  Date: ____________ Movements: ____________ Start time: ____________ Finish time: ____________  Date: ____________ Movements: ____________ Start time: ____________ Finish time: ____________   Date: ____________ Movements: ____________ Start time: ____________ Finish time: ____________  Date: ____________ Movements: ____________ Start time: ____________ Finish time: ____________  Date: ____________ Movements: ____________ Start time: ____________ Finish time: ____________  Date: ____________ Movements: ____________ Start time: ____________ Finish time: ____________  Date: ____________ Movements: ____________ Start time: ____________ Finish time: ____________  Date: ____________ Movements: ____________ Start time: ____________ Finish time: ____________  Date: ____________ Movements: ____________ Start time: ____________ Finish time: ____________   Date: ____________ Movements: ____________ Start time: ____________ Finish time: ____________  Date: ____________ Movements: ____________ Start time: ____________ Finish time: ____________  Date: ____________ Movements: ____________ Start time: ____________ Finish time: ____________  Date: ____________ Movements: ____________ Start time: ____________ Finish time: ____________  Date: ____________ Movements: ____________ Start time: ____________ Finish time: ____________  Date: ____________ Movements: ____________ Start time: ____________ Finish time: ____________  Date: ____________ Movements: ____________ Start time: ____________ Finish time: ____________   Date: ____________ Movements: ____________ Start time: ____________ Finish time: ____________  Date: ____________ Movements: ____________ Start time: ____________ Finish  time: ____________  Date: ____________ Movements: ____________ Start time: ____________ Finish time: ____________  Date: ____________ Movements: ____________ Start time:   ____________ Finish time: ____________  Date: ____________ Movements: ____________ Start time: ____________ Finish time: ____________  Date: ____________ Movements: ____________ Start time: ____________ Finish time: ____________  Date: ____________ Movements: ____________ Start time: ____________ Finish time: ____________   Date: ____________ Movements: ____________ Start time: ____________ Finish time: ____________  Date: ____________ Movements: ____________ Start time: ____________ Finish time: ____________  Date: ____________ Movements: ____________ Start time: ____________ Finish time: ____________  Date: ____________ Movements: ____________ Start time: ____________ Finish time: ____________  Date: ____________ Movements: ____________ Start time: ____________ Finish time: ____________  Date: ____________ Movements: ____________ Start time: ____________ Finish time: ____________  Date: ____________ Movements: ____________ Start time: ____________ Finish time: ____________   Date: ____________ Movements: ____________ Start time: ____________ Finish time: ____________  Date: ____________ Movements: ____________ Start time: ____________ Finish time: ____________  Date: ____________ Movements: ____________ Start time: ____________ Finish time: ____________  Date: ____________ Movements: ____________ Start time: ____________ Finish time: ____________  Date: ____________ Movements: ____________ Start time: ____________ Finish time: ____________  Date: ____________ Movements: ____________ Start time: ____________ Finish time: ____________  Date: ____________ Movements: ____________ Start time: ____________ Finish time: ____________   Date: ____________ Movements: ____________ Start time: ____________ Finish time: ____________  Date: ____________  Movements: ____________ Start time: ____________ Finish time: ____________  Date: ____________ Movements: ____________ Start time: ____________ Finish time: ____________  Date: ____________ Movements: ____________ Start time: ____________ Finish time: ____________  Date: ____________ Movements: ____________ Start time: ____________ Finish time: ____________  Date: ____________ Movements: ____________ Start time: ____________ Finish time: ____________  Date: ____________ Movements: ____________ Start time: ____________ Finish time: ____________   Date: ____________ Movements: ____________ Start time: ____________ Finish time: ____________  Date: ____________ Movements: ____________ Start time: ____________ Finish time: ____________  Date: ____________ Movements: ____________ Start time: ____________ Finish time: ____________  Date: ____________ Movements: ____________ Start time: ____________ Finish time: ____________  Date: ____________ Movements: ____________ Start time: ____________ Finish time: ____________  Date: ____________ Movements: ____________ Start time: ____________ Finish time: ____________     This information is not intended to replace advice given to you by your health care provider. Make sure you discuss any questions you have with your health care provider.     Document Released: 06/05/2006 Document Revised: 05/27/2014 Document Reviewed: 03/02/2012  Elsevier Interactive Patient Education ©2016 [employer] Inc[employer]

## 2015-03-27 NOTE — Progress Notes (Signed)
Subjective:  Ellen Short is a 34 y.o. [phone] at 6945w5d being seen today for ongoing prenatal care.  Patient reports occasional contractions.  Contractions: Irregular.  Vag. Bleeding: None. Movement: Present. Denies leaking of fluid.   The following portions of the patient's history were reviewed and updated as appropriate: allergies, current medications, past family history, past medical history, past social history, past surgical history and problem list. Problem list updated.  Objective:   Filed Vitals:   03/27/15 1049  BP: 119/73  Pulse: 102  Weight: 214 lb (97.07 kg)    Fetal Status: Fetal Heart Rate (bpm): 153 Fundal Height: 41 cm Movement: Present     General:  Alert, oriented and cooperative. Patient is in no acute distress.  Skin: Skin is warm and dry. No rash noted.   Cardiovascular: Normal heart rate noted  Respiratory: Normal respiratory effort, no problems with respiration noted  Abdomen: Soft, gravid, appropriate for gestational age. Pain/Pressure: Present     Pelvic: Vag. Bleeding: None Vag D/C Character: Thin   Cervical exam performed      closed and long,   Extremities: Normal range of motion.  Edema: Trace  Mental Status: Normal mood and affect. Normal behavior. Normal judgment and thought content.   Urinalysis: Urine Protein: Negative Urine Glucose: Negative  Assessment and Plan:  Pregnancy: [id number] at 8645w5d  1. Supervision of normal pregnancy, third trimester  - Culture, Grp B Strep w/Rflx Suscept - GC/Chlamydia probe amp ([place])not at [facility]  Term labor symptoms and general obstetric precautions including but not limited to vaginal bleeding, contractions, leaking of fluid and fetal movement were reviewed in detail with the patient. Please refer to After Visit Summary for other counseling recommendations.  Return in about 1 week (around 04/03/2015).   [name], CNM

## 2015-03-28 LAB — GC/CHLAMYDIA PROBE AMP (~~LOC~~) NOT AT ARMC
Chlamydia: NEGATIVE
NEISSERIA GONORRHEA: NEGATIVE

## 2015-03-29 LAB — CULTURE, STREPTOCOCCUS GRP B W/SUSCEPT

## 2015-04-05 ENCOUNTER — Ambulatory Visit (INDEPENDENT_AMBULATORY_CARE_PROVIDER_SITE_OTHER): Payer: Self-pay | Admitting: Obstetrics & Gynecology

## 2015-04-05 VITALS — BP 113/73 | HR 109 | Wt 213.0 lb

## 2015-04-05 DIAGNOSIS — Z3483 Encounter for supervision of other normal pregnancy, third trimester: Secondary | ICD-10-CM

## 2015-04-05 DIAGNOSIS — Z3493 Encounter for supervision of normal pregnancy, unspecified, third trimester: Secondary | ICD-10-CM

## 2015-04-05 NOTE — Progress Notes (Signed)
Subjective:  Ellen Short is a 34 y.o. [phone] at 5231w0d being seen today for ongoing prenatal care.  Patient reports no complaints.  Contractions: Irregular.  Vag. Bleeding: None. Movement: Present. Denies leaking of fluid.   The following portions of the patient's history were reviewed and updated as appropriate: allergies, current medications, past family history, past medical history, past social history, past surgical history and problem list. Problem list updated.  Objective:   Filed Vitals:   04/05/15 1112  BP: 113/73  Pulse: 109  Weight: 213 lb (96.616 kg)    Fetal Status: Fetal Heart Rate (bpm): 158   Movement: Present     General:  Alert, oriented and cooperative. Patient is in no acute distress.  Skin: Skin is warm and dry. No rash noted.   Cardiovascular: Normal heart rate noted  Respiratory: Normal respiratory effort, no problems with respiration noted  Abdomen: Soft, gravid, appropriate for gestational age. Pain/Pressure: Present     Pelvic: Vag. Bleeding: None Vag D/C Character: Thin   Cervical exam performed        Extremities: Normal range of motion.  Edema: Trace  Mental Status: Normal mood and affect. Normal behavior. Normal judgment and thought content.   Urinalysis: Urine Protein: Negative Urine Glucose: Negative  Assessment and Plan:  Pregnancy: [id number] at 1731w0d  1. Supervision of normal pregnancy, third trimester  c/s in 1 week  Term labor symptoms and general obstetric precautions including but not limited to vaginal bleeding, contractions, leaking of fluid and fetal movement were reviewed in detail with the patient. Please refer to After Visit Summary for other counseling recommendations.  Return in about 7 weeks (around 05/24/2015).   Lesly DukesKelly H Leggett, MD

## 2015-04-07 NOTE — H&P (Signed)
  Elder NegusJennifer Wara-Goss is an 34 y.o. [id number] 6174w0d female.   Chief Complaint: Previous C-section x 4  HPI: here for elective repeat C-section at 39 wks  Past Medical History  Diagnosis Date  . Complication of anesthesia     ITCHING  . Infection     UTI WITH PREGNANCY  . Migraine     MIGRAINES  . Yeast infection   . H/O varicella     Past Surgical History  Procedure Laterality Date  . Cesarean section    . Tonsillectomy  AGE 34  . Wisdom tooth extraction  AGE 44    Family History  Problem Relation Age of Onset  . Rheum arthritis Father   . Birth defects Son     HAS ONLY ONE KIDNEY  . Miscarriages / Stillbirths Maternal Grandmother   . Parkinsonism Maternal Grandmother    Social History:  reports that she has never smoked. She has never used smokeless tobacco. She reports that she does not drink alcohol or use illicit drugs.    Allergies  Allergen Reactions  . Sulfa Antibiotics Swelling and Rash  . Amoxicillin Hives and Itching  . Erythromycin Hives and Itching  . Penicillins Hives and Nausea And Vomiting    Has patient had a PCN reaction causing immediate rash, facial/tongue/throat swelling, SOB or lightheadedness with hypotension: No Has patient had a PCN reaction causing severe rash involving mucus membranes or skin necrosis: No Has patient had a PCN reaction that required hospitalization No Has patient had a PCN reaction occurring within the last 10 years: No If all of the above answers are "NO", then may proceed with Cephalosporin use.     No current facility-administered medications on file prior to encounter.   Current Outpatient Prescriptions on File Prior to Encounter  Medication Sig Dispense Refill  . calcium carbonate (TUMS - DOSED IN MG ELEMENTAL CALCIUM) 500 MG chewable tablet Chew 3-4 tablets by mouth at bedtime as needed for indigestion or heartburn.     . Docosahexaenoic Acid (PRENATAL DHA PO) Take 1 tablet by mouth at bedtime.    Marland Kitchen.  HYDROcodone-acetaminophen (NORCO/VICODIN) 5-325 MG per tablet Take 1 tablet by mouth every 6 (six) hours as needed. (Patient not taking: Reported on 03/29/2015) 30 tablet 0    A comprehensive review of systems was negative.  General appearance: alert, cooperative and appears stated age Head: Normocephalic, without obvious abnormality, atraumatic Neck: supple, symmetrical, trachea midline Lungs: normal effort Heart: regular rate and rhythm Abdomen: soft, non-tender; bowel sounds normal; no masses,  no organomegaly Extremities: extremities normal, atraumatic, no cyanosis or edema Skin: Skin color, texture, turgor normal. No rashes or lesions Neurologic: Grossly normal   Lab Results  Component Value Date   WBC 9.7 01/30/2015   HGB 11.0* 01/30/2015   HCT 32.7* 01/30/2015   MCV 92.1 01/30/2015   PLT 269 01/30/2015         ABO, Rh: A/POS/-- (05/09 1157)  Antibody: NEG (05/09 1157)  Rubella: !Error!  RPR: NON REAC (09/12 1102)  HBsAg: NEGATIVE (05/09 1157)  HIV: NONREACTIVE (09/12 1102)  GBS:       Assessment/Plan Principal Problem:   Previous cesarean delivery affecting pregnancy, antepartum  For ERLTCS x 5. Risks include but are not limited to bleeding, infection, injury to surrounding structures, including bowel, bladder and ureters, blood clots, and death.  Likelihood of success is high.   Marilyn Nihiser S 04/07/2015, 11:27 AM

## 2015-04-10 NOTE — Patient Instructions (Signed)
Your procedure is scheduled on:  Wednesday, Nov. 23, 2016  [facility] at:  7:30 AM  Pick up the phone at the desk and dial [phone].  Call this number if you have problems the morning of surgery: [phone].  Remember: Do NOT eat food or drink after:  Midnight Tuesday Take these medicines the morning of surgery with a SIP OF WATER: None  Do NOT wear jewelry (body piercing), metal hair clips/bobby pins, make-up, or nail polish. Do NOT wear lotions, powders, or perfumes.  You may wear deoderant. Do NOT shave for 48 hours prior to surgery. Do NOT bring valuables to the hospital. Leave suitcase in car.  After surgery it may be brought to your room.  For patients admitted to the hospital, checkout time is 11:00 AM the day of discharge.

## 2015-04-11 ENCOUNTER — Encounter (HOSPITAL_COMMUNITY)
Admission: RE | Admit: 2015-04-11 | Discharge: 2015-04-11 | Disposition: A | Discharge status: Home / Self Care | Payer: Self-pay | Source: Ambulatory Visit | Attending: Family Medicine | Admitting: Family Medicine

## 2015-04-11 ENCOUNTER — Encounter (HOSPITAL_COMMUNITY): Payer: Self-pay

## 2015-04-11 VITALS — BP 94/73 | HR 89 | Temp 99.0°F | Resp 18 | Ht 62.0 in | Wt 216.5 lb

## 2015-04-11 DIAGNOSIS — Z01818 Encounter for other preprocedural examination: Secondary | ICD-10-CM | POA: Insufficient documentation

## 2015-04-11 DIAGNOSIS — Z3493 Encounter for supervision of normal pregnancy, unspecified, third trimester: Secondary | ICD-10-CM

## 2015-04-11 DIAGNOSIS — M549 Dorsalgia, unspecified: Secondary | ICD-10-CM

## 2015-04-11 DIAGNOSIS — O34219 Maternal care for unspecified type scar from previous cesarean delivery: Secondary | ICD-10-CM

## 2015-04-11 DIAGNOSIS — O2343 Unspecified infection of urinary tract in pregnancy, third trimester: Secondary | ICD-10-CM

## 2015-04-11 DIAGNOSIS — O9989 Other specified diseases and conditions complicating pregnancy, childbirth and the puerperium: Secondary | ICD-10-CM

## 2015-04-11 DIAGNOSIS — O09522 Supervision of elderly multigravida, second trimester: Secondary | ICD-10-CM

## 2015-04-11 LAB — CBC
HCT: 35.3 % — ABNORMAL LOW (ref 36.0–46.0)
HEMOGLOBIN: 12 g/dL (ref 12.0–15.0)
MCH: 31.5 pg (ref 26.0–34.0)
MCHC: 34 g/dL (ref 30.0–36.0)
MCV: 92.7 fL (ref 78.0–100.0)
PLATELETS: 236 10*3/uL (ref 150–400)
RBC: 3.81 MIL/uL — AB (ref 3.87–5.11)
RDW: 14.7 % (ref 11.5–15.5)
WBC: 8.9 10*3/uL (ref 4.0–10.5)

## 2015-04-11 NOTE — Anesthesia Preprocedure Evaluation (Addendum)
Anesthesia Evaluation  Patient identified by MRN, date of birth, ID band Patient awake    Reviewed: Allergy & Precautions, H&P , NPO status , Patient's Chart, lab work & pertinent test results  History of Anesthesia Complications (+) history of anesthetic complications  Airway Mallampati: II  TM Distance: >3 FB Neck ROM: full    Dental no notable dental hx.    Pulmonary neg pulmonary ROS,    Pulmonary exam normal breath sounds clear to auscultation       Cardiovascular negative cardio ROS Normal cardiovascular exam Rhythm:regular Rate:Normal     Neuro/Psych  Headaches, negative psych ROS   GI/Hepatic negative GI ROS, Neg liver ROS,   Endo/Other  Morbid obesity  Renal/GU negative Renal ROS  negative genitourinary   Musculoskeletal   Abdominal (+) + obese,   Peds  Hematology negative hematology ROS (+)   Anesthesia Other Findings Patient has previously had 4 C sections  Itching - severe with last C section, will avoid intrathecal opioid after discussing with patient  Type and Screen current  Reproductive/Obstetrics (+) Pregnancy                            Anesthesia Physical Anesthesia Plan  ASA: III  Anesthesia Plan: Combined Spinal and Epidural   Post-op Pain Management:    Induction:   Airway Management Planned:   Additional Equipment:   Intra-op Plan:   Post-operative Plan:   Informed Consent: I have reviewed the patients History and Physical, chart, labs and discussed the procedure including the risks, benefits and alternatives for the proposed anesthesia with the patient or authorized representative who has indicated his/her understanding and acceptance.     Plan Discussed with: Anesthesiologist and CRNA  Anesthesia Plan Comments:         Anesthesia Quick Evaluation

## 2015-04-12 ENCOUNTER — Encounter (HOSPITAL_COMMUNITY): Payer: Self-pay | Admitting: Anesthesiology

## 2015-04-12 ENCOUNTER — Inpatient Hospital Stay (HOSPITAL_COMMUNITY): Payer: Self-pay | Admitting: Anesthesiology

## 2015-04-12 ENCOUNTER — Encounter (HOSPITAL_COMMUNITY)
Admission: RE | Disposition: A | Discharge status: Home / Self Care | Payer: Self-pay | Source: Ambulatory Visit | Attending: Family Medicine

## 2015-04-12 ENCOUNTER — Inpatient Hospital Stay (HOSPITAL_COMMUNITY)
Admission: RE | Admit: 2015-04-12 | Discharge: 2015-04-15 | DRG: 766 | Disposition: A | Discharge status: Home / Self Care | Payer: Self-pay | Source: Ambulatory Visit | Attending: Family Medicine | Admitting: Family Medicine

## 2015-04-12 DIAGNOSIS — O34219 Maternal care for unspecified type scar from previous cesarean delivery: Secondary | ICD-10-CM | POA: Diagnosis present

## 2015-04-12 DIAGNOSIS — O09523 Supervision of elderly multigravida, third trimester: Secondary | ICD-10-CM

## 2015-04-12 DIAGNOSIS — O34211 Maternal care for low transverse scar from previous cesarean delivery: Principal | ICD-10-CM | POA: Diagnosis present

## 2015-04-12 DIAGNOSIS — Z3A39 39 weeks gestation of pregnancy: Secondary | ICD-10-CM

## 2015-04-12 DIAGNOSIS — O9989 Other specified diseases and conditions complicating pregnancy, childbirth and the puerperium: Secondary | ICD-10-CM

## 2015-04-12 DIAGNOSIS — M549 Dorsalgia, unspecified: Secondary | ICD-10-CM

## 2015-04-12 DIAGNOSIS — Z6839 Body mass index (BMI) 39.0-39.9, adult: Secondary | ICD-10-CM

## 2015-04-12 DIAGNOSIS — O99891 Other specified diseases and conditions complicating pregnancy: Secondary | ICD-10-CM

## 2015-04-12 DIAGNOSIS — O99214 Obesity complicating childbirth: Secondary | ICD-10-CM | POA: Diagnosis present

## 2015-04-12 LAB — PREPARE RBC (CROSSMATCH)

## 2015-04-12 LAB — RPR: RPR: NONREACTIVE

## 2015-04-12 SURGERY — Surgical Case
Anesthesia: Spinal | Site: Abdomen

## 2015-04-12 MED ORDER — MORPHINE SULFATE (PF) 0.5 MG/ML IJ SOLN
INTRAMUSCULAR | Status: DC | PRN
  Administered 2015-04-12: .1 mg via INTRATHECAL

## 2015-04-12 MED ORDER — BUPIVACAINE IN DEXTROSE 0.75-8.25 % IT SOLN
INTRATHECAL | Status: DC | PRN
  Administered 2015-04-12: 1.6 mL via INTRATHECAL

## 2015-04-12 MED ORDER — PROMETHAZINE HCL 25 MG/ML IJ SOLN: 6.2500 mg | INTRAMUSCULAR | Status: DC | PRN

## 2015-04-12 MED ORDER — SODIUM CHLORIDE 0.9 % IJ SOLN: 3.0000 mL | INTRAMUSCULAR | Status: DC | PRN

## 2015-04-12 MED ORDER — ONDANSETRON HCL 4 MG/2ML IJ SOLN
INTRAMUSCULAR | Status: AC
  Filled 2015-04-12: qty 2

## 2015-04-12 MED ORDER — DIBUCAINE 1 % RE OINT: 1.0000 "application " | TOPICAL_OINTMENT | RECTAL | Status: DC | PRN

## 2015-04-12 MED ORDER — MORPHINE SULFATE (PF) 0.5 MG/ML IJ SOLN
INTRAMUSCULAR | Status: AC
  Filled 2015-04-12: qty 10

## 2015-04-12 MED ORDER — NALBUPHINE HCL 10 MG/ML IJ SOLN: 5.0000 mg | Freq: Once | INTRAMUSCULAR | Status: AC | PRN

## 2015-04-12 MED ORDER — SODIUM CHLORIDE 0.9 % IR SOLN
Status: DC | PRN
  Administered 2015-04-12: 1000 mL

## 2015-04-12 MED ORDER — OXYTOCIN 10 UNIT/ML IJ SOLN
INTRAMUSCULAR | Status: AC
  Filled 2015-04-12: qty 4

## 2015-04-12 MED ORDER — NALOXONE HCL 2 MG/2ML IJ SOSY
1.0000 ug/kg/h | PREFILLED_SYRINGE | INTRAVENOUS | Status: DC | PRN
  Filled 2015-04-12: qty 2

## 2015-04-12 MED ORDER — NALOXONE HCL 0.4 MG/ML IJ SOLN: 0.4000 mg | INTRAMUSCULAR | Status: DC | PRN

## 2015-04-12 MED ORDER — DIPHENHYDRAMINE HCL 50 MG/ML IJ SOLN
INTRAMUSCULAR | Status: AC
  Filled 2015-04-12: qty 1

## 2015-04-12 MED ORDER — LACTATED RINGERS IV SOLN
INTRAVENOUS | Status: DC
  Administered 2015-04-13: 03:00:00 via INTRAVENOUS

## 2015-04-12 MED ORDER — BUPIVACAINE HCL (PF) 0.25 % IJ SOLN
INTRAMUSCULAR | Status: AC
  Filled 2015-04-12: qty 30

## 2015-04-12 MED ORDER — FENTANYL CITRATE (PF) 100 MCG/2ML IJ SOLN: 25.0000 ug | INTRAMUSCULAR | Status: DC | PRN

## 2015-04-12 MED ORDER — LACTATED RINGERS IV SOLN
INTRAVENOUS | Status: DC
Start: 2015-04-12 — End: 2015-04-12
  Administered 2015-04-12 (×2): via INTRAVENOUS

## 2015-04-12 MED ORDER — DEXAMETHASONE SODIUM PHOSPHATE 4 MG/ML IJ SOLN
INTRAMUSCULAR | Status: DC | PRN
  Administered 2015-04-12: 4 mg via INTRAVENOUS

## 2015-04-12 MED ORDER — ACETAMINOPHEN 500 MG PO TABS
1000.0000 mg | ORAL_TABLET | Freq: Four times a day (QID) | ORAL | Status: AC
  Administered 2015-04-12: 1000 mg via ORAL
  Filled 2015-04-12: qty 2

## 2015-04-12 MED ORDER — LIDOCAINE-EPINEPHRINE (PF) 2 %-1:200000 IJ SOLN
INTRAMUSCULAR | Status: AC
  Filled 2015-04-12: qty 20

## 2015-04-12 MED ORDER — LACTATED RINGERS IV SOLN
Freq: Once | INTRAVENOUS | Status: AC
  Administered 2015-04-12: 08:00:00 via INTRAVENOUS

## 2015-04-12 MED ORDER — LACTATED RINGERS IV SOLN
INTRAVENOUS | Status: DC | PRN
  Administered 2015-04-12 (×3): via INTRAVENOUS

## 2015-04-12 MED ORDER — ACETAMINOPHEN 325 MG PO TABS
650.0000 mg | ORAL_TABLET | ORAL | Status: DC | PRN
  Administered 2015-04-14 (×2): 650 mg via ORAL
  Filled 2015-04-12 (×2): qty 2

## 2015-04-12 MED ORDER — OXYTOCIN 10 UNIT/ML IJ SOLN
INTRAMUSCULAR | Status: DC | PRN
  Administered 2015-04-12: 40 [IU] via INTRAMUSCULAR

## 2015-04-12 MED ORDER — IBUPROFEN 600 MG PO TABS
600.0000 mg | ORAL_TABLET | Freq: Four times a day (QID) | ORAL | Status: DC
  Administered 2015-04-12 – 2015-04-13 (×4): 600 mg via ORAL
  Filled 2015-04-12 (×4): qty 1

## 2015-04-12 MED ORDER — BUPIVACAINE HCL (PF) 0.25 % IJ SOLN
INTRAMUSCULAR | Status: DC | PRN
  Administered 2015-04-12: 30 mL

## 2015-04-12 MED ORDER — ONDANSETRON HCL 4 MG/2ML IJ SOLN
4.0000 mg | Freq: Once | INTRAMUSCULAR | Status: DC | PRN
Start: 2015-04-12 — End: 2015-04-12

## 2015-04-12 MED ORDER — NALBUPHINE HCL 10 MG/ML IJ SOLN
5.0000 mg | INTRAMUSCULAR | Status: DC | PRN
  Administered 2015-04-12 – 2015-04-13 (×2): 5 mg via INTRAVENOUS
  Filled 2015-04-12 (×2): qty 1

## 2015-04-12 MED ORDER — CEFAZOLIN SODIUM-DEXTROSE 2-3 GM-% IV SOLR: 2.0000 g | INTRAVENOUS | Status: DC

## 2015-04-12 MED ORDER — OXYCODONE-ACETAMINOPHEN 5-325 MG PO TABS
1.0000 | ORAL_TABLET | ORAL | Status: DC | PRN
  Administered 2015-04-13: 1 via ORAL
  Filled 2015-04-12: qty 1

## 2015-04-12 MED ORDER — KETOROLAC TROMETHAMINE 30 MG/ML IJ SOLN
INTRAMUSCULAR | Status: AC
  Filled 2015-04-12: qty 1

## 2015-04-12 MED ORDER — GENTAMICIN SULFATE 40 MG/ML IJ SOLN
Freq: Once | INTRAVENOUS | Status: AC
  Administered 2015-04-12: 114.75 mL via INTRAVENOUS
  Filled 2015-04-12: qty 8.75

## 2015-04-12 MED ORDER — PHENYLEPHRINE 8 MG IN D5W 100 ML (0.08MG/ML) PREMIX OPTIME
INJECTION | INTRAVENOUS | Status: AC
  Filled 2015-04-12: qty 100

## 2015-04-12 MED ORDER — LANOLIN HYDROUS EX OINT: 1.0000 "application " | TOPICAL_OINTMENT | CUTANEOUS | Status: DC | PRN

## 2015-04-12 MED ORDER — OXYTOCIN 40 UNITS IN LACTATED RINGERS INFUSION - SIMPLE MED: 62.5000 mL/h | INTRAVENOUS | Status: AC

## 2015-04-12 MED ORDER — DIPHENHYDRAMINE HCL 25 MG PO CAPS: 25.0000 mg | ORAL_CAPSULE | Freq: Four times a day (QID) | ORAL | Status: DC | PRN

## 2015-04-12 MED ORDER — KETOROLAC TROMETHAMINE 30 MG/ML IJ SOLN
30.0000 mg | Freq: Four times a day (QID) | INTRAMUSCULAR | Status: AC | PRN
  Administered 2015-04-12: 30 mg via INTRAMUSCULAR

## 2015-04-12 MED ORDER — TETANUS-DIPHTH-ACELL PERTUSSIS 5-2.5-18.5 LF-MCG/0.5 IM SUSP: 0.5000 mL | Freq: Once | INTRAMUSCULAR | Status: DC

## 2015-04-12 MED ORDER — KETOROLAC TROMETHAMINE 30 MG/ML IJ SOLN: 30.0000 mg | Freq: Four times a day (QID) | INTRAMUSCULAR | Status: AC | PRN

## 2015-04-12 MED ORDER — ONDANSETRON HCL 4 MG/2ML IJ SOLN
INTRAMUSCULAR | Status: DC | PRN
  Administered 2015-04-12: 4 mg via INTRAVENOUS

## 2015-04-12 MED ORDER — PHENYLEPHRINE HCL 10 MG/ML IJ SOLN
INTRAMUSCULAR | Status: DC | PRN
  Administered 2015-04-12 (×2): 40 ug via INTRAVENOUS
  Administered 2015-04-12: 80 ug via INTRAVENOUS

## 2015-04-12 MED ORDER — SODIUM BICARBONATE 8.4 % IV SOLN
INTRAVENOUS | Status: AC
  Filled 2015-04-12: qty 50

## 2015-04-12 MED ORDER — MEPERIDINE HCL 25 MG/ML IJ SOLN: 6.2500 mg | INTRAMUSCULAR | Status: DC | PRN

## 2015-04-12 MED ORDER — NALBUPHINE HCL 10 MG/ML IJ SOLN
5.0000 mg | INTRAMUSCULAR | Status: DC | PRN
  Administered 2015-04-13: 5 mg via SUBCUTANEOUS
  Filled 2015-04-12: qty 1

## 2015-04-12 MED ORDER — CHLOROPROCAINE HCL (PF) 3 % IJ SOLN
INTRAMUSCULAR | Status: AC
  Filled 2015-04-12: qty 20

## 2015-04-12 MED ORDER — DEXAMETHASONE SODIUM PHOSPHATE 4 MG/ML IJ SOLN
INTRAMUSCULAR | Status: AC
  Filled 2015-04-12: qty 1

## 2015-04-12 MED ORDER — OXYCODONE-ACETAMINOPHEN 5-325 MG PO TABS
2.0000 | ORAL_TABLET | ORAL | Status: DC | PRN
  Administered 2015-04-12 – 2015-04-13 (×5): 2 via ORAL
  Filled 2015-04-12 (×5): qty 2

## 2015-04-12 MED ORDER — DIPHENHYDRAMINE HCL 50 MG/ML IJ SOLN
12.5000 mg | INTRAMUSCULAR | Status: DC | PRN
  Administered 2015-04-12: 12.5 mg via INTRAVENOUS

## 2015-04-12 MED ORDER — MENTHOL 3 MG MT LOZG: 1.0000 | LOZENGE | OROMUCOSAL | Status: DC | PRN

## 2015-04-12 MED ORDER — CHLOROPROCAINE HCL (PF) 3 % IJ SOLN
INTRAMUSCULAR | Status: DC | PRN
  Administered 2015-04-12: 20 mL via EPIDURAL

## 2015-04-12 MED ORDER — ONDANSETRON HCL 4 MG/2ML IJ SOLN: 4.0000 mg | Freq: Three times a day (TID) | INTRAMUSCULAR | Status: DC | PRN

## 2015-04-12 MED ORDER — SODIUM BICARBONATE 8.4 % IV SOLN
INTRAVENOUS | Status: DC | PRN
  Administered 2015-04-12 (×4): 5 mL via EPIDURAL

## 2015-04-12 MED ORDER — DIPHENHYDRAMINE HCL 25 MG PO CAPS
25.0000 mg | ORAL_CAPSULE | ORAL | Status: DC | PRN
  Administered 2015-04-12: 25 mg via ORAL
  Filled 2015-04-12 (×2): qty 1

## 2015-04-12 MED ORDER — CITRIC ACID-SODIUM CITRATE 334-500 MG/5ML PO SOLN
30.0000 mL | Freq: Once | ORAL | Status: AC
Start: 2015-04-12 — End: 2015-04-12
  Administered 2015-04-12: 30 mL via ORAL

## 2015-04-12 MED ORDER — NALBUPHINE HCL 10 MG/ML IJ SOLN
5.0000 mg | Freq: Once | INTRAMUSCULAR | Status: AC | PRN
  Administered 2015-04-12: 5 mg via SUBCUTANEOUS

## 2015-04-12 MED ORDER — SIMETHICONE 80 MG PO CHEW
80.0000 mg | CHEWABLE_TABLET | Freq: Three times a day (TID) | ORAL | Status: DC
  Administered 2015-04-12 – 2015-04-15 (×8): 80 mg via ORAL
  Filled 2015-04-12 (×7): qty 1

## 2015-04-12 MED ORDER — CITRIC ACID-SODIUM CITRATE 334-500 MG/5ML PO SOLN
ORAL | Status: AC
  Filled 2015-04-12: qty 15

## 2015-04-12 MED ORDER — SCOPOLAMINE 1 MG/3DAYS TD PT72
MEDICATED_PATCH | TRANSDERMAL | Status: AC
  Administered 2015-04-12: 1.5 mg via TRANSDERMAL
  Filled 2015-04-12: qty 1

## 2015-04-12 MED ORDER — SCOPOLAMINE 1 MG/3DAYS TD PT72: 1.0000 | MEDICATED_PATCH | Freq: Once | TRANSDERMAL | Status: DC

## 2015-04-12 MED ORDER — WITCH HAZEL-GLYCERIN EX PADS: 1.0000 "application " | MEDICATED_PAD | CUTANEOUS | Status: DC | PRN

## 2015-04-12 MED ORDER — SIMETHICONE 80 MG PO CHEW
80.0000 mg | CHEWABLE_TABLET | ORAL | Status: DC
  Administered 2015-04-13 – 2015-04-14 (×2): 80 mg via ORAL
  Filled 2015-04-12 (×3): qty 1

## 2015-04-12 MED ORDER — ZOLPIDEM TARTRATE 5 MG PO TABS: 5.0000 mg | ORAL_TABLET | Freq: Every evening | ORAL | Status: DC | PRN

## 2015-04-12 MED ORDER — PHENYLEPHRINE 8 MG IN D5W 100 ML (0.08MG/ML) PREMIX OPTIME
INJECTION | INTRAVENOUS | Status: DC | PRN
  Administered 2015-04-12: 60 ug/min via INTRAVENOUS

## 2015-04-12 MED ORDER — PRENATAL MULTIVITAMIN CH
1.0000 | ORAL_TABLET | Freq: Every day | ORAL | Status: DC
  Administered 2015-04-13 – 2015-04-15 (×3): 1 via ORAL
  Filled 2015-04-12 (×3): qty 1

## 2015-04-12 MED ORDER — SCOPOLAMINE 1 MG/3DAYS TD PT72
1.0000 | MEDICATED_PATCH | Freq: Once | TRANSDERMAL | Status: DC
Start: 2015-04-12 — End: 2015-04-12
  Administered 2015-04-12: 1.5 mg via TRANSDERMAL

## 2015-04-12 MED ORDER — SENNOSIDES-DOCUSATE SODIUM 8.6-50 MG PO TABS
2.0000 | ORAL_TABLET | ORAL | Status: DC
  Administered 2015-04-13 – 2015-04-14 (×3): 2 via ORAL
  Filled 2015-04-12 (×3): qty 2

## 2015-04-12 MED ORDER — SIMETHICONE 80 MG PO CHEW: 80.0000 mg | CHEWABLE_TABLET | ORAL | Status: DC | PRN

## 2015-04-12 MED ORDER — NALBUPHINE HCL 10 MG/ML IJ SOLN
INTRAMUSCULAR | Status: AC
  Administered 2015-04-13: 5 mg via SUBCUTANEOUS
  Filled 2015-04-12: qty 1

## 2015-04-12 SURGICAL SUPPLY — 38 items
BENZOIN TINCTURE PRP APPL 2/3 (GAUZE/BANDAGES/DRESSINGS) ×3 IMPLANT
CLAMP CORD UMBIL (MISCELLANEOUS) IMPLANT
CLOSURE STERI-STRIP 1/2X4 (GAUZE/BANDAGES/DRESSINGS) ×1
CLOSURE WOUND 1/2 X4 (GAUZE/BANDAGES/DRESSINGS) ×1
CLOTH BEACON ORANGE TIMEOUT ST (SAFETY) ×3 IMPLANT
CLSR STERI-STRIP ANTIMIC 1/2X4 (GAUZE/BANDAGES/DRESSINGS) ×2 IMPLANT
DECANTER SPIKE VIAL GLASS SM (MISCELLANEOUS) ×3 IMPLANT
DRAPE SHEET LG 3/4 BI-LAMINATE (DRAPES) IMPLANT
DRSG OPSITE POSTOP 4X10 (GAUZE/BANDAGES/DRESSINGS) ×3 IMPLANT
DURAPREP 26ML APPLICATOR (WOUND CARE) ×3 IMPLANT
ELECT REM PT RETURN 9FT ADLT (ELECTROSURGICAL) ×3
ELECTRODE REM PT RTRN 9FT ADLT (ELECTROSURGICAL) ×1 IMPLANT
EXTRACTOR VACUUM M CUP 4 TUBE (SUCTIONS) IMPLANT
EXTRACTOR VACUUM M CUP 4' TUBE (SUCTIONS)
GLOVE BIOGEL PI IND STRL 7.0 (GLOVE) ×2 IMPLANT
GLOVE BIOGEL PI INDICATOR 7.0 (GLOVE) ×4
GLOVE ECLIPSE 7.0 STRL STRAW (GLOVE) ×6 IMPLANT
GOWN STRL REUS W/TWL LRG LVL3 (GOWN DISPOSABLE) ×6 IMPLANT
KIT ABG SYR 3ML LUER SLIP (SYRINGE) IMPLANT
NEEDLE HYPO 22GX1.5 SAFETY (NEEDLE) ×3 IMPLANT
NEEDLE HYPO 25X5/8 SAFETYGLIDE (NEEDLE) IMPLANT
NS IRRIG 1000ML POUR BTL (IV SOLUTION) ×3 IMPLANT
PACK C SECTION WH (CUSTOM PROCEDURE TRAY) ×3 IMPLANT
PAD ABD 7.5X8 STRL (GAUZE/BANDAGES/DRESSINGS) ×3 IMPLANT
PAD ABD DERMACEA PRESS 5X9 (GAUZE/BANDAGES/DRESSINGS) ×3 IMPLANT
PAD OB MATERNITY 4.3X12.25 (PERSONAL CARE ITEMS) ×3 IMPLANT
PENCIL BUTTON HOLSTER BLD 10FT (ELECTRODE) ×3 IMPLANT
PENCIL SMOKE EVAC W/HOLSTER (ELECTROSURGICAL) ×3 IMPLANT
RTRCTR C-SECT PINK 25CM LRG (MISCELLANEOUS) ×3 IMPLANT
SPONGE GAUZE 4X4 12PLY STER LF (GAUZE/BANDAGES/DRESSINGS) ×3 IMPLANT
STRIP CLOSURE SKIN 1/2X4 (GAUZE/BANDAGES/DRESSINGS) ×2 IMPLANT
SUT VIC AB 0 CTX 36 (SUTURE) ×8
SUT VIC AB 0 CTX36XBRD ANBCTRL (SUTURE) ×4 IMPLANT
SUT VIC AB 4-0 KS 27 (SUTURE) ×3 IMPLANT
SYR 30ML LL (SYRINGE) ×3 IMPLANT
TOWEL OR 17X24 6PK STRL BLUE (TOWEL DISPOSABLE) ×3 IMPLANT
TRAY FOLEY CATH SILVER 14FR (SET/KITS/TRAYS/PACK) ×3 IMPLANT
YANKAUER SUCT BULB TIP NO VENT (SUCTIONS) ×3 IMPLANT

## 2015-04-12 NOTE — Anesthesia Postprocedure Evaluation (Signed)
Anesthesia Post Note  Patient: Ellen NegusJennifer Wara-Goss  Procedure(s) Performed: Procedure(s) (LRB): CESAREAN SECTION (N/A)  Patient location during evaluation: Mother Baby Anesthesia Type: Combined Spinal/Epidural Level of consciousness: awake, awake and alert, oriented and patient cooperative Pain management: pain level controlled Vital Signs Assessment: post-procedure vital signs reviewed and stable Respiratory status: spontaneous breathing, nonlabored ventilation and respiratory function stable Cardiovascular status: stable Postop Assessment: No headache, No backache, Patient able to bend at knees and No signs of nausea or vomiting Anesthetic complications: no    Last Vitals:  Filed Vitals:   04/12/15 1315 04/12/15 1345  BP:  113/67  Pulse: 79 68  Temp:  36.8 C  Resp: 21 18    Last Pain:  Filed Vitals:   04/12/15 1521  PainSc: 7                  Dior Stepter L

## 2015-04-12 NOTE — Interval H&P Note (Signed)
History and Physical Interval Note:  04/12/2015 8:51 AM  Ellen NegusJennifer Wara-Goss  has presented today for surgery, with the diagnosis of  REPEAT x 4 c/sections  The various methods of treatment have been discussed with the patient and family. After consideration of risks, benefits and other options for treatment, the patient has consented to  Procedure(s): CESAREAN SECTION (N/A) as a surgical intervention .  The patient's history has been reviewed, patient examined, no change in status, stable for surgery.  I have reviewed the patient's chart and labs.  Questions were answered to the patient's satisfaction.     Lochlann Mastrangelo S

## 2015-04-12 NOTE — Anesthesia Procedure Notes (Signed)
Spinal Patient location during procedure: OR Staffing Anesthesiologist: Daney Moor Performed by: anesthesiologist  Preanesthetic Checklist Completed: patient identified, site marked, surgical consent, pre-op evaluation, timeout performed, IV checked, risks and benefits discussed and monitors and equipment checked Spinal Block Patient position: sitting Prep: Betadine Patient monitoring: heart rate, continuous pulse ox and blood pressure Injection technique: single-shot Needle Needle type: Sprotte  Needle gauge: 25 G Needle length: 9 cm Needle insertion depth: 6 cm Catheter at skin depth: 10 cm Assessment Sensory level: T6 Additional Notes Functioning IV was confirmed and monitors were applied. Sterile prep and drape, including hand hygiene, mask and sterile gloves were used. The patient was positioned and the spine was prepped. The skin was anesthetized with lidocaine.  The epidural space was entered with Touhy with great loss of resistance. CSE needle then was passed. Free flow of clear CSF was obtained prior to injecting local anesthetic into the CSF.  The spinal needle aspirated freely following injection.  The needle was carefully withdrawn.  Catheter was left as CSE was performed.  The patient tolerated the procedure well. Consent was obtained prior to procedure with all questions answered and concerns addressed.  Blima DessertBen Christean Silvestri, MD

## 2015-04-12 NOTE — Transfer of Care (Signed)
Immediate Anesthesia Transfer of Care Note  Patient: Ellen NegusJennifer Short  Procedure(s) Performed: Procedure(s): CESAREAN SECTION (N/A)  Patient Location: PACU  Anesthesia Type:Epidural  Level of Consciousness: awake, alert , oriented and patient cooperative  Airway & Oxygen Therapy: Patient Spontanous Breathing  Post-op Assessment: Report given to RN and Post -op Vital signs reviewed and stable  Post vital signs: Reviewed and stable  Last Vitals:  TEMP 98.2 HR 77 RR 17 BP 113/61 POX 97  Complications: No apparent anesthesia complications

## 2015-04-12 NOTE — Lactation Note (Signed)
This note was copied from the chart of Ellen Short. Lactation Consultation Note  Patient Name: Ellen Short ZOXWR'UToday's Date: 04/12/2015 Reason for consult: Initial assessment Mom has limited bf with all of her children. The oldest stopped because he was having tongue "issues", one child had reflux, one child had blood in her stool, and one she was readmitted to the hospital with cellulitis, they told her to pump and dump because of antibiotics. She states that she really wants to bf this baby for as long as she can. Discussed feeding frequency, voids, baby behavior, breast changes, and nipple care. Given lactation handouts. She is aware of OP services and support group.      Maternal Data Has patient been taught Hand Expression?: Yes Does the patient have breastfeeding experience prior to this delivery?: Yes  Feeding    LATCH Score/Interventions                      Lactation Tools Discussed/Used WIC Program: No   Consult Status Consult Status: Follow-up Date: 04/13/15 Follow-up type: In-patient    Rulon Eisenmengerlizabeth E Portia Wisdom 04/12/2015, 8:38 PM

## 2015-04-12 NOTE — Progress Notes (Signed)
UR chart review completed.  

## 2015-04-12 NOTE — Anesthesia Postprocedure Evaluation (Signed)
Anesthesia Post Note  Patient: Ellen NegusJennifer Wara-Goss  Procedure(s) Performed: Procedure(s) (LRB): CESAREAN SECTION (N/A)  Patient location during evaluation: PACU Anesthesia Type: Spinal and Epidural Level of consciousness: oriented and awake and alert Pain management: pain level controlled Vital Signs Assessment: post-procedure vital signs reviewed and stable Respiratory status: spontaneous breathing, respiratory function stable and patient connected to nasal cannula oxygen Cardiovascular status: blood pressure returned to baseline and stable Postop Assessment: No backache, No signs of nausea or vomiting and Patient able to bend at knees Anesthetic complications: no    Last Vitals:  Filed Vitals:   04/12/15 1100 04/12/15 1115  BP: 110/52 103/64  Pulse: 87 78  Temp:    Resp: 18 14    Last Pain: There were no vitals filed for this visit.               Reino KentJudd, Willys Salvino J

## 2015-04-12 NOTE — Op Note (Signed)
Cesarean Section Operative Report  Ellen Short  04/12/2015  Indications: Scheduled Proceedure/Maternal Request   Pre-operative Diagnosis:  REPEAT x 4 c/sections.   Post-operative Diagnosis: Same   Surgeon: Surgeon(s) and Role:    * Reva Boresanya S Pratt, MD - Primary    * Kathrynn RunningNoah Bedford Lynna Zamorano, MD - Assisting   Attending Attestation: I was present and scrubbed for the entire procedure.   Assistants: Glennie Isleam Le, medical student  Anesthesia: spinal, nesicaine topical, marcaine subdermal   Estimated Blood Loss: 1200 ml  Total IV Fluids: 3500 ml LR   Urine Output:: 300 ml clear yellow urine  Specimens: none  Findings: Viable female infant in vertex presentation; Apgars 9/9; weight 3925 g; arterial cord pH not obtained3; clear amniotic fluid; intact placenta with three vessel cord; normal uterus, fallopian tubes and ovaries bilaterally. Moderate amount of anterior abdominal wall adhesions, bladder adhesed to lower uterine segment.  Baby condition / location:  Couplet care / Skin to Skin   Complications: EBL 1200 ml  Indications: Ellen Short is a 34 y.o. [id number] with an IUP [id number] presenting for scheduled repeat cesarean section. History c/s x4.  The risks, benefits, complications, treatment options, and expected outcomes were discussed with the patient . The patient concurred with the proposed plan, giving informed consent. identified as Ellen Short and the procedure verified as C-Section Delivery.  Procedure Details:  The patient was taken back to the operative suite where spinal anesthesia was placed.  A time out was held and the above information confirmed.   After induction of anesthesia, the patient was draped and prepped in the usual sterile manner and placed in a dorsal supine position with a leftward tilt. A Pfannenstiel incision was made and carried down through the subcutaneous tissue to the fascia. Fascial incision was made and bluntly and sharply extended  transversely. The fascia was separated from the underlying rectus tissue superiorly and inferiorly. The right rectus was transected bluntly when the peritoneum was entered bluntly.  Alexis retractor was placed. The bladder was adherent to the lower uterine segment and was bluntly separated and pushed inferiorly. A low transverse uterine incision was made and extended bluntly. Delivered from cephalic presentation was a viable infant with Apgars and weight as above. The umbilical cord was clamped and cut cord blood was obtained for evaluation. Cord ph was not sent. The placenta was removed Intact and appeared normal. The uterine outline, tubes and ovaries appeared normal}. The uterine incision was closed with running locked sutures of 0Vicryl with an imbricating layer of the same. Bovie cautery was used to cauterize bleeding vessels at serosal margins.   Hemostasis was observed. 20 ml 0.3& nesicaine was poured over the uterus. Bleeding at the left rectus was suture ligated with 0 vicryl. The peritoneum and rectus were reapproximated with 0 vicryl, and hemostasis was observed. The fascia was then reapproximated with running sutures of 0Vicryl. No subcuticular closure. 30 cc of 0.25% marcaine was injected subdermally. The skin was closed with 4-0Vicryl.   Instrument, sponge, and needle counts were correct prior the abdominal closure and were correct at the conclusion of the case.     Disposition: PACU - hemodynamically stable.   Maternal Condition: stable       Signed: Lavonne Chickoah B WoukMD 04/12/2015 10:34 AM

## 2015-04-13 LAB — CBC
HEMATOCRIT: 29.8 % — AB (ref 36.0–46.0)
Hemoglobin: 10.2 g/dL — ABNORMAL LOW (ref 12.0–15.0)
MCH: 32.2 pg (ref 26.0–34.0)
MCHC: 34.2 g/dL (ref 30.0–36.0)
MCV: 94 fL (ref 78.0–100.0)
PLATELETS: 184 10*3/uL (ref 150–400)
RBC: 3.17 MIL/uL — ABNORMAL LOW (ref 3.87–5.11)
RDW: 14.7 % (ref 11.5–15.5)
WBC: 14.1 10*3/uL — AB (ref 4.0–10.5)

## 2015-04-13 MED ORDER — KETOROLAC TROMETHAMINE 30 MG/ML IJ SOLN
30.0000 mg | Freq: Four times a day (QID) | INTRAMUSCULAR | Status: DC | PRN
  Administered 2015-04-13 – 2015-04-14 (×3): 30 mg via INTRAMUSCULAR
  Filled 2015-04-13 (×3): qty 1

## 2015-04-13 MED ORDER — HYDROMORPHONE HCL 2 MG PO TABS
2.0000 mg | ORAL_TABLET | Freq: Once | ORAL | Status: AC
  Administered 2015-04-13: 2 mg via ORAL
  Filled 2015-04-13: qty 1

## 2015-04-13 MED ORDER — HYDROMORPHONE HCL 2 MG PO TABS
2.0000 mg | ORAL_TABLET | Freq: Four times a day (QID) | ORAL | Status: DC | PRN
  Administered 2015-04-13 – 2015-04-15 (×7): 2 mg via ORAL
  Filled 2015-04-13 (×7): qty 1

## 2015-04-13 MED ORDER — KETOROLAC TROMETHAMINE 30 MG/ML IJ SOLN: 30.0000 mg | Freq: Four times a day (QID) | INTRAMUSCULAR | Status: DC | PRN

## 2015-04-13 NOTE — Progress Notes (Signed)
Subjective: Postpartum Day 1: Cesarean Delivery Patient reports incisional pain and tolerating PO.    Objective: Vital signs in last 24 hours: Temp:  [97.6 F (36.4 C)-98.3 F (36.8 C)] 98 F (36.7 C) (11/24 0509) Pulse Rate:  [65-91] 73 (11/24 0509) Resp:  [13-24] 18 (11/24 0509) BP: (102-120)/(46-81) 104/54 mmHg (11/24 0509) SpO2:  [90 %-99 %] 98 % (11/24 0509)  Physical Exam:  General: alert, cooperative and no distress Lochia: appropriate Uterine Fundus: firm Incision: healing well, no significant drainage DVT Evaluation: No evidence of DVT seen on physical exam.   Recent Labs  04/11/15 1155 04/13/15 0530  HGB 12.0 10.2*  HCT 35.3* 29.8*    Assessment/Plan: Status post Cesarean section. Doing well postoperatively.  Continue current care.  [facility] HospitalWILLIAMS,Ellen Schommer 04/13/2015, 7:42 AM

## 2015-04-13 NOTE — Lactation Note (Signed)
This note was copied from the chart of Ellen Short. Lactation Consultation Note: Experienced BF mom reports this baby has been nursing great since delivery. Has had some difficulty with nursing in the past (cellulitis- on antibiotics- pumped and dumped- supply diminished) States she really wants to breast feed this baby for longer since it is her last. LS 9 by RN. Reports no pain with latch. Baby asleep in Dad's arms. No questions at present. To call prn  Patient Name: Ellen Short BJYNW'GToday's Date: 04/13/2015 Reason for consult: Follow-up assessment   Maternal Data Formula Feeding for Exclusion: No Does the patient have breastfeeding experience prior to this delivery?: Yes  Feeding    LATCH Score/Interventions                      Lactation Tools Discussed/Used     Consult Status Consult Status: PRN    Pamelia HoitWeeks, Airianna Kreischer D 04/13/2015, 3:08 PM

## 2015-04-14 NOTE — Progress Notes (Signed)
Subjective: Postpartum Day 2: Cesarean Delivery Patient reports incisional pain and tolerating PO.  Pt. With ongoing pain requiring dilaudid and toradol along with ibuprofen to control her pain. She says that she normally is ok with pain, but that it has been more than she can handle at home. Denies bleeding, nausea, vomiting, fever, or chills.   Objective: Vital signs in last 24 hours: Temp:  [98 F (36.7 C)-98.7 F (37.1 C)] 98 F (36.7 C) (11/25 0554) Pulse Rate:  [79-83] 79 (11/25 0554) Resp:  [18] 18 (11/25 0554) BP: (107-117)/(56-67) 107/56 mmHg (11/25 0554) SpO2:  [98 %-99 %] 99 % (11/24 1813)  Physical Exam:  General: alert, cooperative and no distress Lochia: appropriate Uterine Fundus: firm Incision: healing well, no significant drainage, mild ttp of abdomen.  DVT Evaluation: No evidence of DVT seen on physical exam.   Recent Labs  04/11/15 1155 04/13/15 0530  HGB 12.0 10.2*  HCT 35.3* 29.8*    Assessment/Plan: Status post Cesarean section. Doing well postoperatively.  Some pain - continue dilaudid prn for now. Will see how she is doing tomorrow.  Continue current care.  Caleb Melancon 04/14/2015, 7:43 AM  I have seen and examined this patient and agree the above assessment.  Respiratory effort normal, lochia appropriate, legs negative,  pain level normal.  CRESENZO-DISHMAN,Cloey Sferrazza 04/18/2015 9:11 AM

## 2015-04-14 NOTE — Lactation Note (Signed)
This note was copied from the chart of Ellen Short. Lactation Consultation Note  Baby having repeated 10 min feedings. Mother requiring Diluadid (L3) and Torodol (L2) for ongoing incisional pain. Discussed with mom that medication can cause sedation with infant and to be aware and be proactive. Undress baby for feedings.  Breastfeed STS. Reviewed waking techniques. Encouraged mother to breastfeed on both breasts and for longer than 10 min. Feed on demand and wake baby if she does not wake to feed. Continue monitoring voids/stools.     Patient Name: Ellen Short UJWJX'BToday's Date: 04/14/2015 Reason for consult: Follow-up assessment   Maternal Data    Feeding Feeding Type: Breast Fed  LATCH Score/Interventions                      Lactation Tools Discussed/Used     Consult Status Consult Status: PRN    Hardie PulleyBerkelhammer, Jettie Mannor Boschen 04/14/2015, 2:28 PM

## 2015-04-15 LAB — TYPE AND SCREEN
ABO/RH(D): A POS
ANTIBODY SCREEN: NEGATIVE
UNIT DIVISION: 0
UNIT DIVISION: 0

## 2015-04-15 MED ORDER — HYDROMORPHONE HCL 2 MG PO TABS: 2.0000 mg | ORAL_TABLET | Freq: Four times a day (QID) | ORAL | Status: DC | PRN

## 2015-04-15 NOTE — Discharge Summary (Signed)
OB Discharge Summary     Patient Name: Ellen Short DOB: 17-May-1981 MRN: [id number]  Date of admission: 04/12/2015 Delivering MD: Reva Bores   Date of discharge: 04/15/2015  Admitting diagnosis: cpt [phone] - REPEAT 4 c-sections Intrauterine pregnancy: [redacted]w[redacted]d     Secondary diagnosis:  Principal Problem:   Previous cesarean delivery affecting pregnancy, antepartum Active Problems:   Term pregnancy delivered  Additional problems: None.      Discharge diagnosis: Term Pregnancy Delivered                                                                                                Post partum procedures:none.   Augmentation: none  Complications: None  Hospital course:  Sceduled C/S   34 y.o. yo [id number] at [redacted]w[redacted]d was admitted to the hospital 04/12/2015 for scheduled cesarean section with the following indication:Elective Repeat.  Membrane Rupture Time/Date: 9:31 AM ,04/12/2015   Patient delivered a Viable infant.04/12/2015  Details of operation can be found in separate operative note.  Pateint had an uncomplicated postpartum course.  She is ambulating, tolerating a regular diet, passing flatus, and urinating well. Patient is discharged home in stable condition on 04/15/15. Physical exam  Filed Vitals:   04/13/15 1813 04/14/15 0554 04/14/15 1824 04/15/15 0500  BP: 117/67 107/56 118/60 105/71  Pulse: 83 79 82 69  Temp: 98.1 F (36.7 C) 98 F (36.7 C) 98.2 F (36.8 C) 98 F (36.7 C)  TempSrc: Oral Oral Oral Oral  Resp: SpO2: 99% 99%  99%   General: alert, cooperative and no distress Lochia: appropriate Uterine Fundus: firm Incision: Healing well with no significant drainage, No significant erythema, Dressing is clean, dry, and intact DVT Evaluation: No evidence of DVT seen on physical exam. Labs: Lab Results  Component Value Date   WBC 14.1* 04/13/2015   HGB 10.2* 04/13/2015   HCT 29.8* 04/13/2015   MCV 94.0 04/13/2015   PLT 184 04/13/2015    CMP Latest Ref Rng 08/18/2014  Glucose 70 - 99 mg/dL 956(O)  BUN 6 - 23 mg/dL 10  Creatinine 1.30 - 8.65 mg/dL 7.84  Sodium 696 - 295 mmol/L 138  Potassium 3.5 - 5.1 mmol/L 3.9  Chloride 96 - 112 mmol/L 106  CO2 19 - 32 mmol/L 24  Calcium 8.4 - 10.5 mg/dL 8.6  Total Protein 6.0 - 8.3 g/dL 6.9  Total Bilirubin 0.3 - 1.2 mg/dL 0.6  Alkaline Phos 39 - 117 U/L 44  AST 0 - 37 U/L 20  ALT 0 - 35 U/L 27    Discharge instruction: per After Visit Summary and "Baby and Me Booklet".  After visit meds:    Medication List    STOP taking these medications        cephALEXin 250 MG capsule  Commonly known as:  KEFLEX     PRENATAL DHA PO      TAKE these medications        calcium carbonate 500 MG chewable tablet  Commonly known as:  TUMS - dosed in mg elemental calcium  Chew 3-4 tablets by mouth  at bedtime as needed for indigestion or heartburn.     HYDROcodone-acetaminophen 5-325 MG tablet  Commonly known as:  NORCO/VICODIN  Take 1 tablet by mouth every 6 (six) hours as needed.     HYDROmorphone 2 MG tablet  Commonly known as:  DILAUDID  Take 1 tablet (2 mg total) by mouth every 6 (six) hours as needed for severe pain.     prenatal multivitamin Tabs tablet  Take 1 tablet by mouth daily at 12 noon.        Diet: routine diet  Activity: Advance as tolerated. Pelvic rest for 6 weeks.   Outpatient follow up:2 weeks Follow up Appt:No future appointments. Follow up Visit:No Follow-up on file.  Postpartum contraception: Vasectomy  Newborn Data: Live born female  Birth Weight: 8 lb 10.5 oz (3925 g) APGAR: 9, 9  Baby Feeding: Breast Disposition:home with mother   04/15/2015 Devota Pacealeb Melancon, MD  CNM attestation I have seen and examined this patient and agree with above documentation in the resident's note.   Elder NegusJennifer Wara-Goss is a 34 y.o. [id number] s/p rLTCS.   Pain is well controlled.  Plan for birth control is vasectomy.  Method of Feeding: breast  PE:  BP 105/71  mmHg  Pulse 69  Temp(Src) 98 F (36.7 C) (Oral)  Resp 16  SpO2 99%  LMP  (Approximate)  Breastfeeding? Unknown Fundus firm   Recent Labs  04/13/15 0530  HGB 10.2*  HCT 29.8*     Plan: discharge today - postpartum care discussed - f/u clinic in 2 weeks for incision check, and then 6wks for PP visit   Cam HaiSHAW, KIMBERLY, CNM 9:58 AM  04/15/2015

## 2015-04-15 NOTE — Discharge Instructions (Signed)
Cesarean Delivery, Care After  Refer to this sheet in the next few weeks. These instructions provide you with information on caring for yourself after your procedure. Your health care provider may also give you specific instructions. Your treatment has been planned according to current medical practices, but problems sometimes occur. Call your health care provider if you have any problems or questions after you go home.  HOME CARE INSTRUCTIONS   Only take over-the-counter or prescription medications as directed by your health care provider.   Do not drink alcohol, especially if you are breastfeeding or taking medication to relieve pain.   Do not chew or smoke tobacco.   Continue to use good perineal care. Good perineal care includes:    Wiping your perineum from front to back.    Keeping your perineum clean.   Check your surgical cut (incision) daily for increased redness, drainage, swelling, or separation of skin.   Clean your incision gently with soap and water every day, and then pat it dry. If your health care provider says it is okay, leave the incision uncovered. Use a bandage (dressing) if the incision is draining fluid or appears irritated. If the adhesive strips across the incision do not fall off within 7 days, carefully peel them off.   Hug a pillow when coughing or sneezing until your incision is healed. This helps to relieve pain.   Do not use tampons or douche until your health care provider says it is okay.   Shower, wash your hair, and take tub baths as directed by your health care provider.   Wear a well-fitting bra that provides breast support.   Limit wearing support panties or control-top hose.   Drink enough fluids to keep your urine clear or pale yellow.   Eat high-fiber foods such as whole grain cereals and breads, brown rice, beans, and fresh fruits and vegetables every day. These foods may help prevent or relieve constipation.   Resume activities such as climbing stairs,  driving, lifting, exercising, or traveling as directed by your health care provider.   Talk to your health care provider about resuming sexual activities. This is dependent upon your risk of infection, your rate of healing, and your comfort and desire to resume sexual activity.   Try to have someone help you with your household activities and your newborn for at least a few days after you leave the hospital.   Rest as much as possible. Try to rest or take a nap when your newborn is sleeping.   Increase your activities gradually.   Keep all of your scheduled postpartum appointments. It is very important to keep your scheduled follow-up appointments. At these appointments, your health care provider will be checking to make sure that you are healing physically and emotionally.  SEEK MEDICAL CARE IF:    You are passing large clots from your vagina. Save any clots to show your health care provider.   You have a foul smelling discharge from your vagina.   You have trouble urinating.   You are urinating frequently.   You have pain when you urinate.   You have a change in your bowel movements.   You have increasing redness, pain, or swelling near your incision.   You have pus draining from your incision.   Your incision is separating.   You have painful, hard, or reddened breasts.   You have a severe headache.   You have blurred vision or see spots.   You feel sad   or depressed.   You have thoughts of hurting yourself or your newborn.   You have questions about your care, the care of your newborn, or medications.   You are dizzy or light-headed.   You have a rash.   You have pain, redness, or swelling at the site of the removed intravenous access (IV) tube.   You have nausea or vomiting.   You stopped breastfeeding and have not had a menstrual period within 12 weeks of stopping.   You are not breastfeeding and have not had a menstrual period within 12 weeks of delivery.   You have a fever.  SEEK  IMMEDIATE MEDICAL CARE IF:   You have persistent pain.   You have chest pain.   You have shortness of breath.   You faint.   You have leg pain.   You have stomach pain.   Your vaginal bleeding saturates 2 or more sanitary pads in 1 hour.  MAKE SURE YOU:    Understand these instructions.   Will watch your condition.   Will get help right away if you are not doing well or get worse.     This information is not intended to replace advice given to you by your health care provider. Make sure you discuss any questions you have with your health care provider.     Document Released: 01/26/2002 Document Revised: 05/27/2014 Document Reviewed: 01/01/2012  Elsevier Interactive Patient Education 2016 [employer]

## 2015-04-15 NOTE — Lactation Note (Signed)
This note was copied from the chart of Ellen Short. Lactation Consultation Note Experienced BF mom. Had many challenges with other, but is "LOVING" BF this baby, states this is her champion BF. Mom's milk came in last night and BF great. Reminded of support groups and engorgement management. Encouraged to still do STS, and monitoring I&O. Has personal pump. Patient Name: Ellen Short JXBJY'NToday's Date: 04/15/2015 Reason for consult: Follow-up assessment   Maternal Data    Feeding Feeding Type: Breast Fed  LATCH Score/Interventions          Comfort (Breast/Nipple): Filling, red/small blisters or bruises, mild/mod discomfort  Problem noted: Filling Interventions (Filling): Massage;Firm support;Frequent nursing  Hold (Positioning): No assistance needed to correctly position infant at breast.     Lactation Tools Discussed/Used     Consult Status Consult Status: Complete Date: 04/15/15    Charyl DancerCARVER, Treyveon Mochizuki G 04/15/2015, 9:08 AM

## 2015-04-17 ENCOUNTER — Encounter (HOSPITAL_COMMUNITY): Payer: Self-pay | Admitting: Family Medicine

## 2015-04-26 ENCOUNTER — Encounter: Payer: Self-pay | Admitting: Obstetrics & Gynecology

## 2015-04-26 ENCOUNTER — Ambulatory Visit (INDEPENDENT_AMBULATORY_CARE_PROVIDER_SITE_OTHER): Payer: Self-pay | Admitting: Obstetrics & Gynecology

## 2015-04-26 VITALS — BP 123/64 | HR 89 | Resp 16 | Wt 194.0 lb

## 2015-04-26 DIAGNOSIS — O872 Hemorrhoids in the puerperium: Secondary | ICD-10-CM

## 2015-04-26 DIAGNOSIS — Z9889 Other specified postprocedural states: Secondary | ICD-10-CM

## 2015-04-26 DIAGNOSIS — L298 Other pruritus: Secondary | ICD-10-CM

## 2015-04-26 NOTE — Progress Notes (Signed)
   Subjective:    Patient ID: Ellen Short, female    DOB: 15-May-1981, 34 y.o.   MRN: [id number]  HPI This 34 yo lady is here for an incision check 2 weeks after her 4th c/s. She complains of hemorrhoids and itching breasts (all over, not nipple).   Review of Systems     Objective:   Physical Exam [id number] Abd- obese, benign Incision- healing great Breast- scratch marks along periphery but no evidence of any infections       Assessment & Plan:  Hemorrhoids- annusol Itchy breasts- cortisone cream/benadryl prn RTC 4 weeks for pp exam

## 2015-05-26 ENCOUNTER — Ambulatory Visit: Payer: Self-pay | Admitting: Advanced Practice Midwife

## 2017-01-26 IMAGING — US US OB TRANSVAGINAL
1 series · 14 of 28 positions shown · non-contrast
Comparison: 08/18/2014

CLINICAL DATA: Inappropriately rising beta HCG levels. First
trimester pregnancy with uncertain fetal viability. Unsure of LMP.
Advanced maternal age.

EXAM:
OBSTETRIC <14 WK US AND TRANSVAGINAL OB US
TECHNIQUE: Both transabdominal and transvaginal ultrasound examinations were
performed for complete evaluation of the gestation as well as the
maternal uterus, adnexal regions, and pelvic cul-de-sac.
Transvaginal technique was performed to assess early pregnancy.

[Series 1: us ob transvaginal · 0.17mm/px · 14 of 48 slices shown]
[im 2/48]
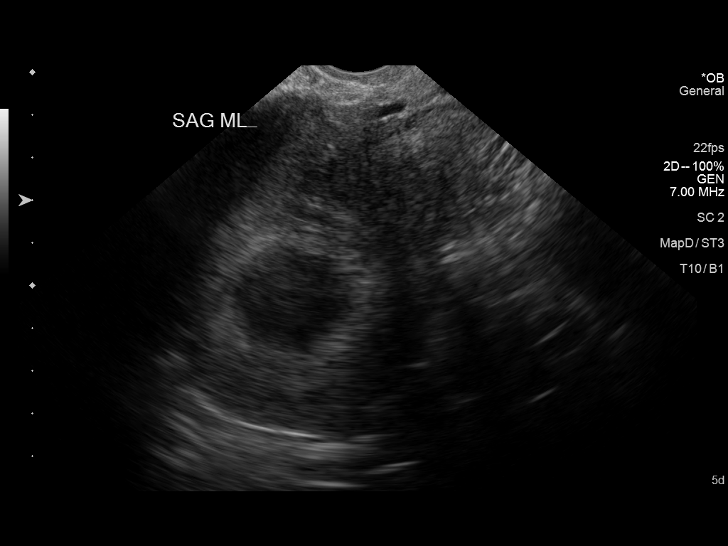
[im 6/48]
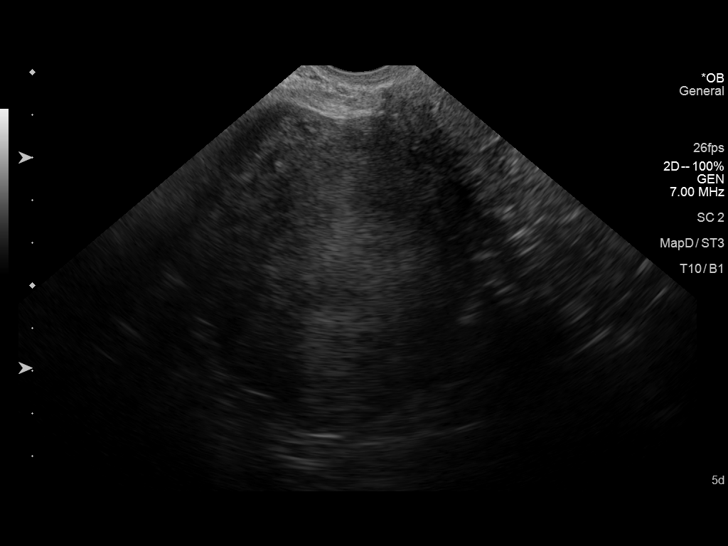
[im 9/48]
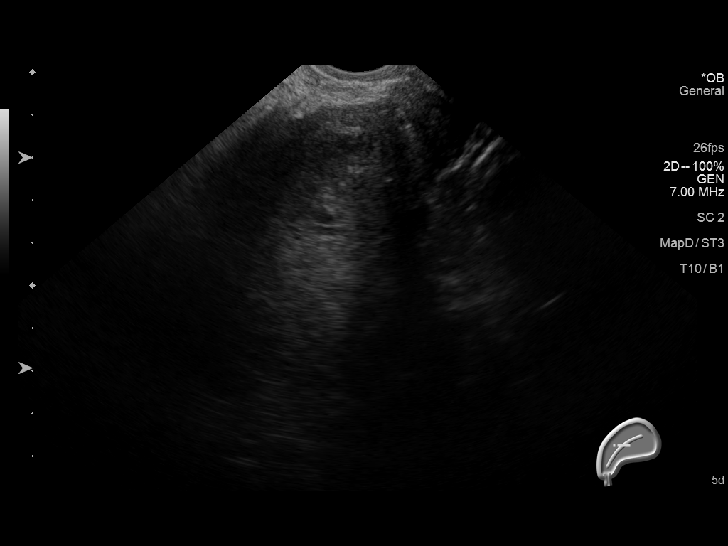
[im 13/48]
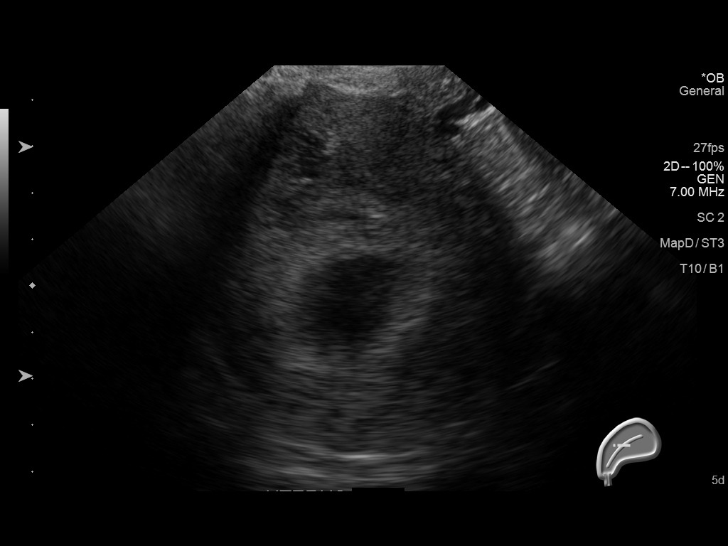
[im 16/48]
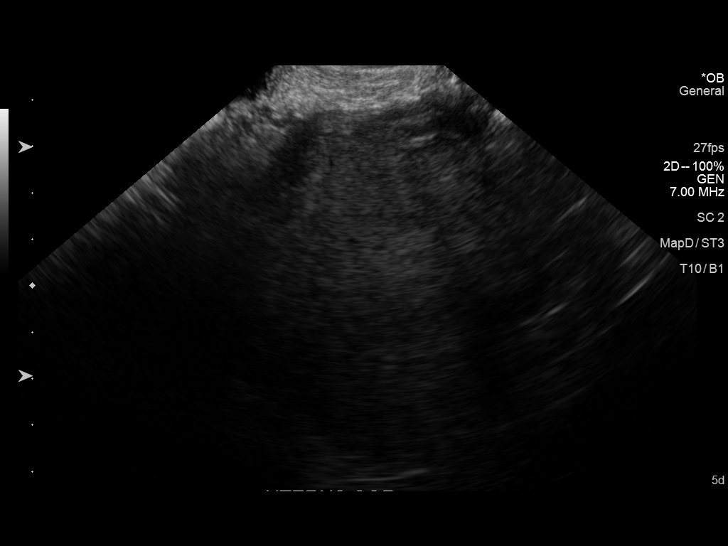
[im 20/48]
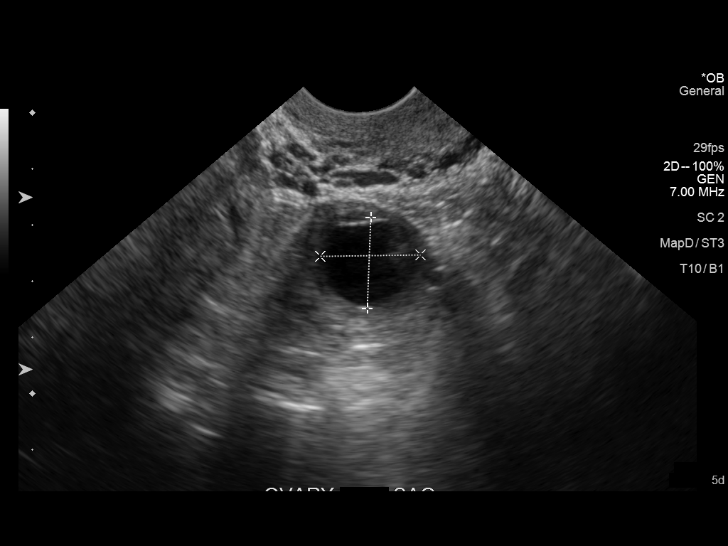
[im 23/48]
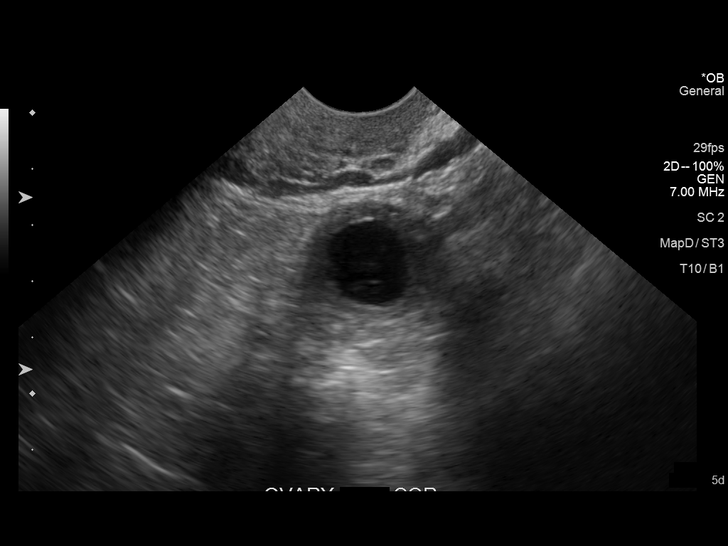
[im 27/48]
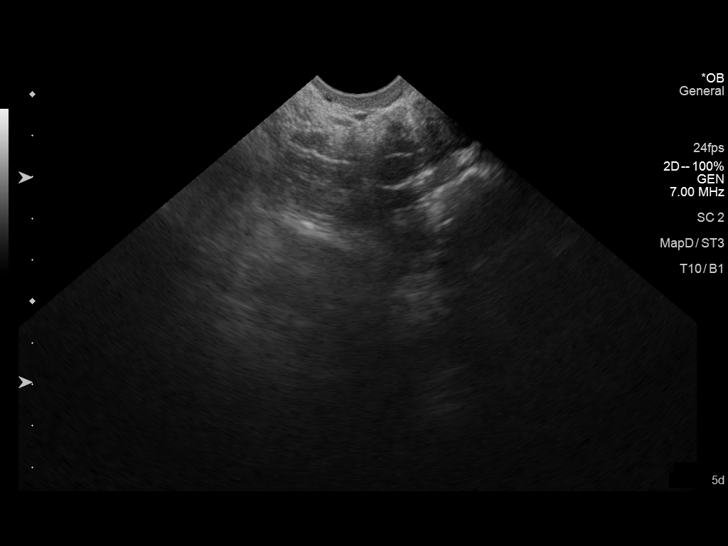
[im 30/48]
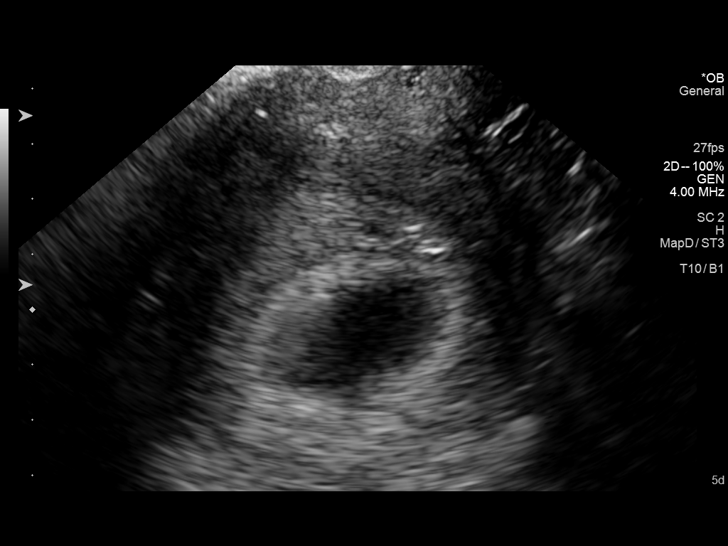
[im 34/48]
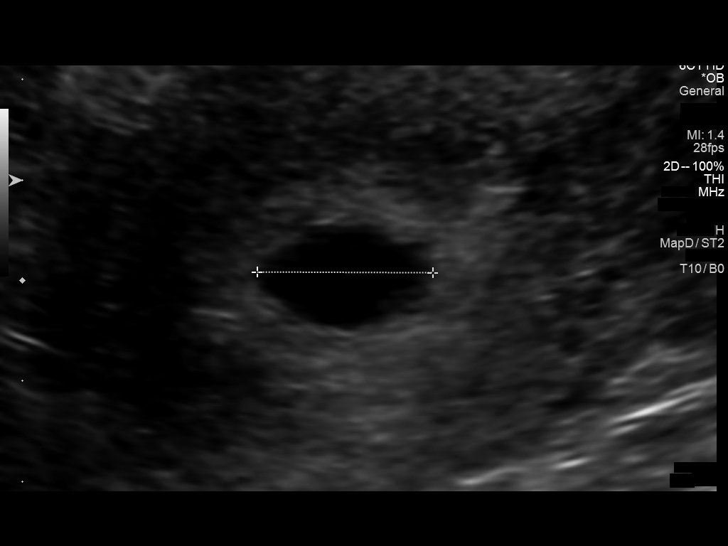
[im 37/48]
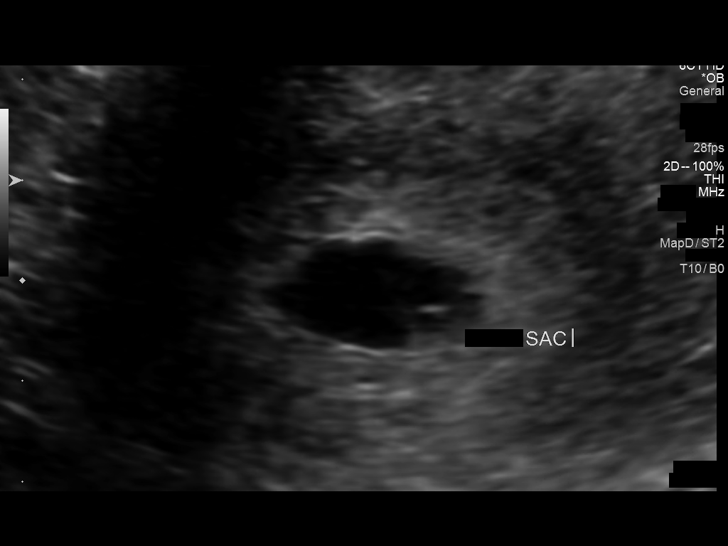
[im 41/48]
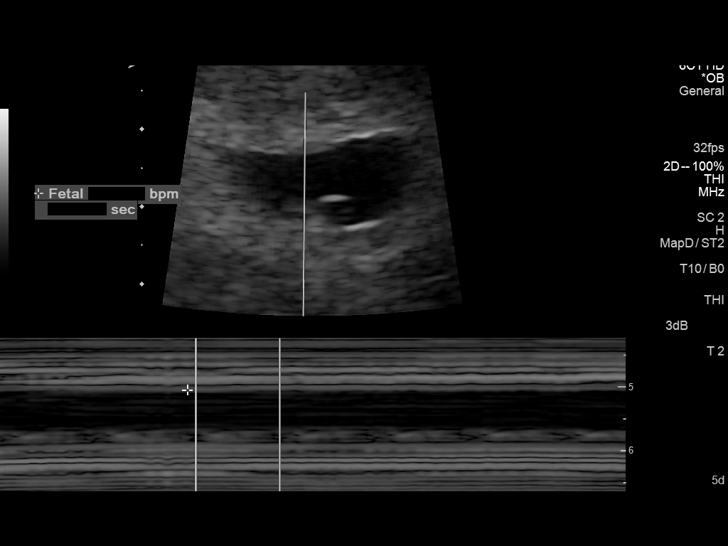
[im 44/48]
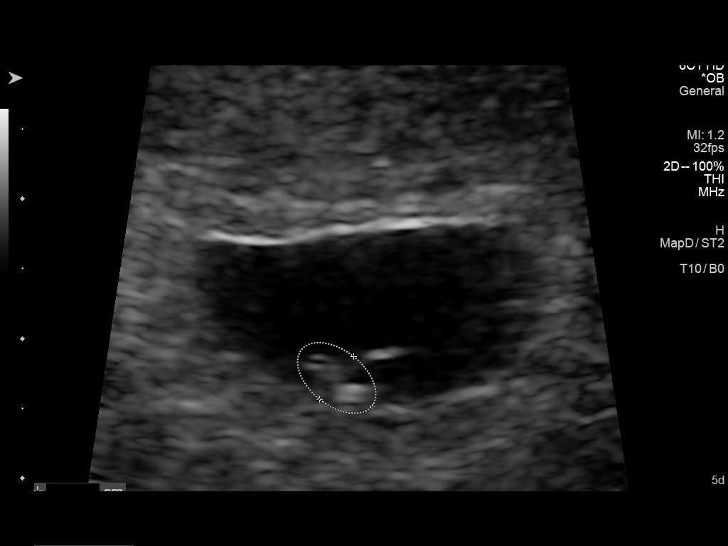
[im 48/48]
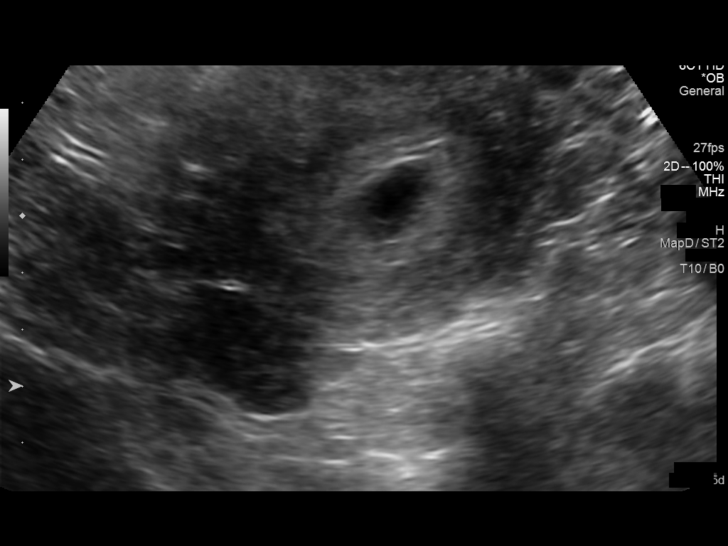

[14 of 28 positions shown; findings below may reference images not displayed]

FINDINGS: Intrauterine gestational sac: Visualized/normal in shape.

Yolk sac:  Visualized

Embryo:  Visualized

Cardiac Activity: Visualized

Heart Rate: 122  bpm

CRL:  5  mm   6 w   1 d                  US EDC: 04/16/2015

Maternal uterus/adnexae: Small left ovarian corpus luteum cyst
noted. Normal appearance of right ovary. No adnexal mass or free
fluid identified.
IMPRESSION: Single living IUP measuring 6 weeks 1 day with US EDC of 04/16/2015.

No significant maternal uterine or adnexal abnormality identified.

## 2017-10-27 ENCOUNTER — Encounter: Payer: Self-pay | Admitting: Sports Medicine

## 2017-10-27 ENCOUNTER — Ambulatory Visit (INDEPENDENT_AMBULATORY_CARE_PROVIDER_SITE_OTHER): Payer: Self-pay | Admitting: Sports Medicine

## 2017-10-27 DIAGNOSIS — M7531 Calcific tendinitis of right shoulder: Secondary | ICD-10-CM | POA: Insufficient documentation

## 2017-10-27 DIAGNOSIS — M7532 Calcific tendinitis of left shoulder: Secondary | ICD-10-CM | POA: Insufficient documentation

## 2017-10-27 MED ORDER — DIAZEPAM 5 MG PO TABS: ORAL_TABLET | ORAL | 0 refills | Status: DC

## 2017-10-27 NOTE — Assessment & Plan Note (Addendum)
Calcific deposit does appear to include the supraspinatus and the infraspinatus. We are going to attempt an ultrasound-guided barbotage at the next visit. To Valiums before the procedure. We will likely do an injection at that time, I did also advise her that it is possible that this would need an arthroscopic surgery. She can continue to use her existing tramadol, 100 mg up to 3 times daily meantime. She will probably need some oxycodone for postprocedural pain.   30-minute slot.

## 2017-10-27 NOTE — Progress Notes (Signed)
Subjective:    CC: New patient visit with the below complaints as noted in HPI:  HPI:  Right shoulder pain: Ellen Short is a pleasant 37 year old female, for many months she has had pain that she localizes over the deltoid of her right shoulder.  She has been to both Dr. Christell ConstantMoore and Dr. 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  A subacromial injection unfortunately was not helpful, not even temporary.  It was recommended that she undergo an MRI, and likely arthroscopic debridement.  She is seeking a second opinion for a less invasive procedure.  She did do a single tramadol pill, no pain relief, not even slightly.  She did not know that she could take 100 mg 3 times daily.  I reviewed the past medical history, family history, social history, surgical history, and allergies today and no changes were needed.  Please see the problem list section below in epic for further details.  Past Medical History: Past Medical History:  Diagnosis Date  . Complication of anesthesia    ITCHING  . H/O varicella   . Infection    UTI WITH PREGNANCY  . Migraine    MIGRAINES  . Yeast infection    Past Surgical History: Past Surgical History:  Procedure Laterality Date  . CESAREAN SECTION     c/s times 4   . CESAREAN SECTION N/A 04/12/2015   Procedure: CESAREAN SECTION;  Surgeon: Reva Boresanya S Pratt, MD;  Location: [facility];  Service: Obstetrics;  Laterality: N/A;  . TONSILLECTOMY  AGE 38  . WISDOM TOOTH EXTRACTION  AGE 1   Social History: Social History   Socioeconomic History  . Marital status: Married    Spouse name: Not on file  . Number of children: 3  . Years of education: 6414  . Highest education level: Not on file  Occupational History  . Occupation: HOMEMAKER  Social Needs  . Financial resource strain: Not on file  . Food insecurity:    Worry: Not on file    Inability: Not on file  . Transportation needs:    Medical: Not  on file    Non-medical: Not on file  Tobacco Use  . Smoking status: Never Smoker  . Smokeless tobacco: Never Used  Substance and Sexual Activity  . Alcohol use: No  . Drug use: No  . Sexual activity: Yes    Partners: Male    Birth control/protection: None  Lifestyle  . Physical activity:    Days per week: Not on file    Minutes per session: Not on file  . Stress: Not on file  Relationships  . Social connections:    Talks on phone: Not on file    Gets together: Not on file    Attends religious service: Not on file    Active member of club or organization: Not on file    Attends meetings of clubs or organizations: Not on file    Relationship status: Not on file  Other Topics Concern  . Not on file  Social History Narrative  . Not on file   Family History: Family History  Problem Relation Age of Onset  . Rheum arthritis Father   . Birth defects Son        HAS ONLY ONE KIDNEY  . Miscarriages / Stillbirths Maternal Grandmother   . Parkinsonism Maternal Grandmother    Allergies: Allergies  Allergen Reactions  . Sulfa Antibiotics Swelling and Rash  . Amoxicillin  Hives and Itching  . Erythromycin Hives and Itching  . Penicillins Hives and Nausea And Vomiting    Has patient had a PCN reaction causing immediate rash, facial/tongue/throat swelling, SOB or lightheadedness with hypotension: No Has patient had a PCN reaction causing severe rash involving mucus membranes or skin necrosis: No Has patient had a PCN reaction that required hospitalization No Has patient had a PCN reaction occurring within the last 10 years: No If all of the above answers are "NO", then may proceed with Cephalosporin use.    Medications: See med rec.  Review of Systems: No headache, visual changes, nausea, vomiting, diarrhea, constipation, dizziness, abdominal pain, skin rash, fevers, chills, night sweats, swollen lymph nodes, weight loss, chest pain, body aches, joint swelling, muscle aches,  shortness of breath, mood changes, visual or auditory hallucinations.  Objective:    General: Well Developed, well nourished, and in no acute distress.  Neuro: Alert and oriented x3, extra-ocular muscles intact, sensation grossly intact.  HEENT: Normocephalic, atraumatic, pupils equal round reactive to light, neck supple, no masses, no lymphadenopathy, thyroid nonpalpable.  Skin: Warm and dry, no rashes noted.  Cardiac: Regular rate and rhythm, no murmurs rubs or gallops.  Respiratory: Clear to auscultation bilaterally. Not using accessory muscles, speaking in full sentences.  Abdominal: Soft, nontender, nondistended, positive bowel sounds, no masses, no organomegaly.  Right shoulder: My exam was minimal considering her pain, but she certainly had all the impingement signs pain over the supraspinatus insertion.  Procedure: Diagnostic Ultrasound of right shoulder Device: GE Logiq E  Findings: Noted copious calcific tendinopathy in the supraspinatus extending posteriorly over the infraspinatus with significant acoustic shadowing Images permanently stored and available for review in the ultrasound unit.  Impression: Supraspinatus and infraspinatus calcific tendinopathy    Impression and Recommendations:    The patient was counselled, risk factors were discussed, anticipatory guidance given.  Calcific tendinitis of right shoulder Calcific deposit does appear to include the supraspinatus and the infraspinatus. We are going to attempt an ultrasound-guided barbotage at the next visit. To Valiums before the procedure. We will likely do an injection at that time, I did also advise her that it is possible that this would need an arthroscopic surgery. She can continue to use her existing tramadol, 100 mg up to 3 times daily meantime. She will probably need some oxycodone for postprocedural pain.   30-minute slot. ___________________________________________ Ihor Austin. Benjamin Stain, M.D., ABFM.,  CAQSM. Primary Care and Sports Medicine [place] [facility]  Adjunct Instructor of Family Medicine  [employer]

## 2017-10-28 ENCOUNTER — Ambulatory Visit (INDEPENDENT_AMBULATORY_CARE_PROVIDER_SITE_OTHER): Payer: Self-pay | Admitting: Sports Medicine

## 2017-10-28 DIAGNOSIS — M7531 Calcific tendinitis of right shoulder: Secondary | ICD-10-CM

## 2017-10-28 MED ORDER — HYDROCODONE-ACETAMINOPHEN 10-325 MG PO TABS: 1.0000 | ORAL_TABLET | ORAL | 0 refills | Status: DC | PRN

## 2017-10-28 NOTE — Progress Notes (Signed)
   Procedure: Real-time Ultrasound Guided barbotage of the right supraspinatus and infraspinatus calcific tendinitis Device: GE Logiq E  Verbal informed consent obtained.  Time-out conducted.  Noted no overlying erythema, induration, or other signs of local infection.  Skin prepped in a sterile fashion.  Local anesthesia: Topical Ethyl chloride.  With sterile technique and under real time ultrasound guidance: Local anesthesia was applied to the skin, subcutaneous tissues, as well as the subacromial bursa, once anesthesia was insured I advanced an 18-gauge needle attached to a syringe with sterile saline into the calcific deposit, using multiple passes the calcific deposit was incised, and then irrigated with several syringes. Completed without difficulty  Pain immediately resolved suggesting accurate placement of the medication.  Advised to call if fevers/chills, erythema, induration, drainage, or persistent bleeding.  Images permanently stored and available for review in the ultrasound unit.  Impression: Technically successful ultrasound guided injection.  I spent 40 minutes with this patient, greater than 50% was face-to-face time counseling regarding the above diagnoses, describing and performing the procedure.  The procedure was not billed for.

## 2017-10-28 NOTE — Assessment & Plan Note (Addendum)
Aggressive ultrasound-guided barbotage with Valium preprocedural anxiolysis. I did also place medication in the subacromial bursa. Sling. Hydrocodone for pain. Repeat x-rays. Return to see me in 1 week.

## 2017-10-30 ENCOUNTER — Ambulatory Visit (INDEPENDENT_AMBULATORY_CARE_PROVIDER_SITE_OTHER): Payer: Self-pay

## 2017-10-30 DIAGNOSIS — M7531 Calcific tendinitis of right shoulder: Secondary | ICD-10-CM

## 2017-11-04 ENCOUNTER — Ambulatory Visit (INDEPENDENT_AMBULATORY_CARE_PROVIDER_SITE_OTHER): Payer: Self-pay | Admitting: Sports Medicine

## 2017-11-04 DIAGNOSIS — M7531 Calcific tendinitis of right shoulder: Secondary | ICD-10-CM

## 2017-11-04 MED ORDER — HYDROCODONE-ACETAMINOPHEN 10-325 MG PO TABS: 1.0000 | ORAL_TABLET | Freq: Two times a day (BID) | ORAL | 0 refills | Status: DC | PRN

## 2017-11-04 NOTE — Assessment & Plan Note (Signed)
1 week post aggressive ultrasound-guided barbotage. Still with some pain but overall improved. Refilling hydrocodone. I would like to see her back in 1 month, she is going to do gentle range of motion exercises in the meantime. It could take several months for this to fully get better, however if she plateaus in terms of her recovery I would like her to see Dr. Everardo PacificVarkey for operative evacuation of the calcific tendinopathy.

## 2017-11-04 NOTE — Progress Notes (Signed)
Subjective:    CC: Follow-up  HPI: This is a pleasant 37 year old female, 1 week ago we did an aggressive ultrasound-guided barbotage of supraspinatus and infraspinatus calcific tendinopathy, I removed several syringes of calcific type grit.  We then did a subacromial injection.  She returns today about 20 to 30% better but still with significant pain as expected.  I reviewed the past medical history, family history, social history, surgical history, and allergies today and no changes were needed.  Please see the problem list section below in epic for further details.  Past Medical History: Past Medical History:  Diagnosis Date  . Complication of anesthesia    ITCHING  . H/O varicella   . Infection    UTI WITH PREGNANCY  . Migraine    MIGRAINES  . Yeast infection    Past Surgical History: Past Surgical History:  Procedure Laterality Date  . CESAREAN SECTION     c/s times 4   . CESAREAN SECTION N/A 04/12/2015   Procedure: CESAREAN SECTION;  Surgeon: Reva Bores, MD;  Location: [facility];  Service: Obstetrics;  Laterality: N/A;  . TONSILLECTOMY  AGE 32  . WISDOM TOOTH EXTRACTION  AGE 62   Social History: Social History   Socioeconomic History  . Marital status: Married    Spouse name: Not on file  . Number of children: 3  . Years of education: 106  . Highest education level: Not on file  Occupational History  . Occupation: HOMEMAKER  Social Needs  . Financial resource strain: Not on file  . Food insecurity:    Worry: Not on file    Inability: Not on file  . Transportation needs:    Medical: Not on file    Non-medical: Not on file  Tobacco Use  . Smoking status: Never Smoker  . Smokeless tobacco: Never Used  Substance and Sexual Activity  . Alcohol use: No  . Drug use: No  . Sexual activity: Yes    Partners: Male    Birth control/protection: None  Lifestyle  . Physical activity:    Days per week: Not on file    Minutes per session: Not on file  . Stress: Not  on file  Relationships  . Social connections:    Talks on phone: Not on file    Gets together: Not on file    Attends religious service: Not on file    Active member of club or organization: Not on file    Attends meetings of clubs or organizations: Not on file    Relationship status: Not on file  Other Topics Concern  . Not on file  Social History Narrative  . Not on file   Family History: Family History  Problem Relation Age of Onset  . Rheum arthritis Father   . Birth defects Son        HAS ONLY ONE KIDNEY  . Miscarriages / Stillbirths Maternal Grandmother   . Parkinsonism Maternal Grandmother    Allergies: Allergies  Allergen Reactions  . Sulfa Antibiotics Swelling and Rash  . Amoxicillin Hives and Itching  . Erythromycin Hives and Itching  . Penicillins Hives and Nausea And Vomiting    Has patient had a PCN reaction causing immediate rash, facial/tongue/throat swelling, SOB or lightheadedness with hypotension: No Has patient had a PCN reaction causing severe rash involving mucus membranes or skin necrosis: No Has patient had a PCN reaction that required hospitalization No Has patient had a PCN reaction occurring within the last 10 years:  No If all of the above answers are "NO", then may proceed with Cephalosporin use.    Medications: See med rec.  Review of Systems: No fevers, chills, night sweats, weight loss, chest pain, or shortness of breath.   Objective:    General: Well Developed, well nourished, and in no acute distress.  Neuro: Alert and oriented x3, extra-ocular muscles intact, sensation grossly intact.  HEENT: Normocephalic, atraumatic, pupils equal round reactive to light, neck supple, no masses, no lymphadenopathy, thyroid nonpalpable.  Skin: Warm and dry, no rashes. Cardiac: Regular rate and rhythm, no murmurs rubs or gallops, no lower extremity edema.  Respiratory: Clear to auscultation bilaterally. Not using accessory muscles, speaking in full  sentences. Right shoulder: She does have some increased range of motion, she can do pendulum swings without much discomfort.  No signs of infection at the surgical site.  Impression and Recommendations:    Calcific tendinitis of right shoulder 1 week post aggressive ultrasound-guided barbotage. Still with some pain but overall improved. Refilling hydrocodone. I would like to see her back in 1 month, she is going to do gentle range of motion exercises in the meantime. It could take several months for this to fully get better, however if she plateaus in terms of her recovery I would like her to see Dr. Everardo PacificVarkey for operative evacuation of the calcific tendinopathy.  ___________________________________________ Ihor Austinhomas J. Benjamin Stainhekkekandam, M.D., ABFM., CAQSM. Primary Care and Sports Medicine [place] [facility]  Adjunct Instructor of Family Medicine  [employer]

## 2017-11-13 ENCOUNTER — Telehealth: Payer: Self-pay | Admitting: Physician Assistant

## 2017-11-13 ENCOUNTER — Ambulatory Visit: Payer: Self-pay | Admitting: Physician Assistant

## 2017-11-13 NOTE — Telephone Encounter (Signed)
Pt. Called at 120 To state that her car battery was dead. She said that she will call back to reschedule.

## 2017-11-26 ENCOUNTER — Encounter: Payer: Self-pay | Admitting: Sports Medicine

## 2017-11-27 ENCOUNTER — Ambulatory Visit (INDEPENDENT_AMBULATORY_CARE_PROVIDER_SITE_OTHER): Payer: Self-pay | Admitting: Sports Medicine

## 2017-11-27 ENCOUNTER — Encounter: Payer: Self-pay | Admitting: Sports Medicine

## 2017-11-27 DIAGNOSIS — M7531 Calcific tendinitis of right shoulder: Secondary | ICD-10-CM

## 2017-11-27 NOTE — Progress Notes (Signed)
Subjective:    CC: Recheck shoulder  HPI: Ellen Short is a very pleasant 37 year old female, she has a long history of right shoulder pain ultimately diagnosed as an extensive calcific tendinopathy of the infraspinatus and supraspinatus.  She has had several modalities including steroid injections, physical therapy, NSAIDs without any improvement.  At the last visit we performed an ultrasound-guided barbotage of the calcific deposit removing a great deal of it, unfortunately she is continued to have residual pain.  She has been resistant to surgical intervention until this point.  I reviewed the past medical history, family history, social history, surgical history, and allergies today and no changes were needed.  Please see the problem list section below in epic for further details.  Past Medical History: Past Medical History:  Diagnosis Date  . Complication of anesthesia    ITCHING  . H/O varicella   . Infection    UTI WITH PREGNANCY  . Migraine    MIGRAINES  . Yeast infection    Past Surgical History: Past Surgical History:  Procedure Laterality Date  . CESAREAN SECTION     c/s times 4   . CESAREAN SECTION N/A 04/12/2015   Procedure: CESAREAN SECTION;  Surgeon: Reva Boresanya S Pratt, MD;  Location: [facility];  Service: Obstetrics;  Laterality: N/A;  . TONSILLECTOMY  AGE 62  . WISDOM TOOTH EXTRACTION  AGE 17   Social History: Social History   Socioeconomic History  . Marital status: Married    Spouse name: Not on file  . Number of children: 3  . Years of education: 3914  . Highest education level: Not on file  Occupational History  . Occupation: HOMEMAKER  Social Needs  . Financial resource strain: Not on file  . Food insecurity:    Worry: Not on file    Inability: Not on file  . Transportation needs:    Medical: Not on file    Non-medical: Not on file  Tobacco Use  . Smoking status: Never Smoker  . Smokeless tobacco: Never Used  Substance and Sexual Activity  . Alcohol use:  No  . Drug use: No  . Sexual activity: Yes    Partners: Male    Birth control/protection: None  Lifestyle  . Physical activity:    Days per week: Not on file    Minutes per session: Not on file  . Stress: Not on file  Relationships  . Social connections:    Talks on phone: Not on file    Gets together: Not on file    Attends religious service: Not on file    Active member of club or organization: Not on file    Attends meetings of clubs or organizations: Not on file    Relationship status: Not on file  Other Topics Concern  . Not on file  Social History Narrative  . Not on file   Family History: Family History  Problem Relation Age of Onset  . Rheum arthritis Father   . Birth defects Son        HAS ONLY ONE KIDNEY  . Miscarriages / Stillbirths Maternal Grandmother   . Parkinsonism Maternal Grandmother    Allergies: Allergies  Allergen Reactions  . Sulfa Antibiotics Swelling and Rash  . Amoxicillin Hives and Itching  . Erythromycin Hives and Itching  . Penicillins Hives and Nausea And Vomiting    Has patient had a PCN reaction causing immediate rash, facial/tongue/throat swelling, SOB or lightheadedness with hypotension: No Has patient had a PCN reaction causing  severe rash involving mucus membranes or skin necrosis: No Has patient had a PCN reaction that required hospitalization No Has patient had a PCN reaction occurring within the last 10 years: No If all of the above answers are "NO", then may proceed with Cephalosporin use.    Medications: See med rec.  Review of Systems: No fevers, chills, night sweats, weight loss, chest pain, or shortness of breath.   Objective:    General: Well Developed, well nourished, and in no acute distress.  Neuro: Alert and oriented x3, extra-ocular muscles intact, sensation grossly intact.  HEENT: Normocephalic, atraumatic, pupils equal round reactive to light, neck supple, no masses, no lymphadenopathy, thyroid nonpalpable.    Skin: Warm and dry, no rashes. Cardiac: Regular rate and rhythm, no murmurs rubs or gallops, no lower extremity edema.  Respiratory: Clear to auscultation bilaterally. Not using accessory muscles, speaking in full sentences.  Impression and Recommendations:    Calcific tendinitis of right shoulder She will partial improvement after ultrasound-guided barbotage of calcific tendinopathy and supraspinatus and infraspinatus. I think the deposit was simply too extensive for me to get the entire thing through an ultrasound-guided barbotage of the we did improve the appearance significantly. At this point I discussed that we would probably need to have her have this treated in the operating room under arthroscopy, referral to Dr. Everardo Pacific.  I spent 25 minutes with this patient, greater than 50% was face-to-face time counseling regarding the above diagnoses, we also discussed surgical intervention and providers that I would recommend for this. ___________________________________________ Ihor Austin. Benjamin Stain, M.D., ABFM., CAQSM. Primary Care and Sports Medicine  [facility]  Adjunct Instructor of Family Medicine  [employer]

## 2017-11-27 NOTE — Assessment & Plan Note (Signed)
She will partial improvement after ultrasound-guided barbotage of calcific tendinopathy and supraspinatus and infraspinatus. I think the deposit was simply too extensive for me to get the entire thing through an ultrasound-guided barbotage of the we did improve the appearance significantly. At this point I discussed that we would probably need to have her have this treated in the operating room under arthroscopy, referral to Dr. Everardo PacificVarkey.

## 2017-12-03 ENCOUNTER — Encounter: Payer: Self-pay | Admitting: Physician Assistant

## 2017-12-03 ENCOUNTER — Ambulatory Visit (INDEPENDENT_AMBULATORY_CARE_PROVIDER_SITE_OTHER): Payer: Self-pay | Admitting: Physician Assistant

## 2017-12-03 ENCOUNTER — Encounter (INDEPENDENT_AMBULATORY_CARE_PROVIDER_SITE_OTHER): Payer: Self-pay | Admitting: Orthopedic Surgery

## 2017-12-03 ENCOUNTER — Ambulatory Visit (INDEPENDENT_AMBULATORY_CARE_PROVIDER_SITE_OTHER): Payer: Self-pay | Admitting: Orthopedic Surgery

## 2017-12-03 ENCOUNTER — Other Ambulatory Visit (HOSPITAL_COMMUNITY)
Admission: RE | Admit: 2017-12-03 | Discharge: 2017-12-03 | Disposition: A | Discharge status: Home / Self Care | Payer: Self-pay | Source: Ambulatory Visit | Attending: Physician Assistant | Admitting: Physician Assistant

## 2017-12-03 VITALS — BP 118/77 | HR 82 | Resp 14 | Ht 62.0 in | Wt 218.0 lb

## 2017-12-03 DIAGNOSIS — Z1151 Encounter for screening for human papillomavirus (HPV): Secondary | ICD-10-CM | POA: Insufficient documentation

## 2017-12-03 DIAGNOSIS — Z1329 Encounter for screening for other suspected endocrine disorder: Secondary | ICD-10-CM

## 2017-12-03 DIAGNOSIS — Z13220 Encounter for screening for lipoid disorders: Secondary | ICD-10-CM

## 2017-12-03 DIAGNOSIS — M25511 Pain in right shoulder: Secondary | ICD-10-CM

## 2017-12-03 DIAGNOSIS — L309 Dermatitis, unspecified: Secondary | ICD-10-CM

## 2017-12-03 DIAGNOSIS — Z130 Encounter for screening for diseases of the blood and blood-forming organs and certain disorders involving the immune mechanism: Secondary | ICD-10-CM

## 2017-12-03 DIAGNOSIS — G8929 Other chronic pain: Secondary | ICD-10-CM

## 2017-12-03 DIAGNOSIS — F3281 Premenstrual dysphoric disorder: Secondary | ICD-10-CM

## 2017-12-03 DIAGNOSIS — Z131 Encounter for screening for diabetes mellitus: Secondary | ICD-10-CM

## 2017-12-03 DIAGNOSIS — Z124 Encounter for screening for malignant neoplasm of cervix: Secondary | ICD-10-CM

## 2017-12-03 DIAGNOSIS — Z7689 Persons encountering health services in other specified circumstances: Secondary | ICD-10-CM

## 2017-12-03 DIAGNOSIS — M6520 Calcific tendinitis, unspecified site: Secondary | ICD-10-CM

## 2017-12-03 MED ORDER — CITALOPRAM HYDROBROMIDE 10 MG PO TABS: 10.0000 mg | ORAL_TABLET | Freq: Every day | ORAL | 1 refills | Status: DC

## 2017-12-03 MED ORDER — CLOBETASOL PROPIONATE 0.05 % EX CREA: 1.0000 "application " | TOPICAL_CREAM | Freq: Two times a day (BID) | CUTANEOUS | 3 refills | Status: DC

## 2017-12-03 NOTE — Progress Notes (Signed)
HPI:                                                                Ellen Short is a 37 y.o. female who presents to [facility] today to establish care  Current Concerns include: Pap smear, "hormones"  Reports feeling like "hormones are off." Endorses increased tearfulness and mood changes with her cycle. This began approximately 2 years ago after the birth of her youngest daughter. She denies history of postpartum depression. Reports a remote history of anxiety, for which she took Zoloft. States she has been on birth control in the past, but there was concern about family history of blood clots, and she was advised to avoid hormonal contraception.  Also reports recurrent pruritic rash in her bilateral external ears. Symptoms wax and wane. Skin will become dry and flaky. She tried Triamcinolone cream with moderate improvement, but rash would recur with discontinuing use of the steroid.   GYN/Sexual Health  Obstetrics: G5P5  Menstrual status: having periods  LMP: 2 weeks ago  Menses: irregular  Last pap smear: unknown  History of abnormal pap smears: no  Sexually active: yes  Current contraception: vasectomy  History of STI: no  Depression screen [facility] 2/9 12/03/2017 12/23/2014 11/25/2014  Decreased Interest 0 0 0  Down, Depressed, Hopeless 0 0 0  PHQ - 2 Score 0 0 0  Altered sleeping 3 - -  Tired, decreased energy 2 - -  Change in appetite 0 - -  Feeling bad or failure about yourself  0 - -  Trouble concentrating 2 - -  Moving slowly or fidgety/restless 0 - -  Suicidal thoughts 0 - -  PHQ-9 Score 7 - -   GAD 7 : Generalized Anxiety Score 12/03/2017  Nervous, Anxious, on Edge 1  Control/stop worrying 0  Worry too much - different things 1  Trouble relaxing 1  Restless 0  Easily annoyed or irritable 1  Afraid - awful might happen 1  Total GAD 7 Score 5     Health Maintenance Health Maintenance  Topic Date Due   . PAP SMEAR  03/08/2000  . INFLUENZA VACCINE  12/18/2017  . TETANUS/TDAP  01/29/2025  . HIV Screening  Completed    Past Medical History:  Diagnosis Date  . Complication of anesthesia    ITCHING  . H/O varicella   . Infection    UTI WITH PREGNANCY  . Migraine    MIGRAINES  . Yeast infection    Past Surgical History:  Procedure Laterality Date  . CESAREAN SECTION     c/s times 4   . CESAREAN SECTION N/A 04/12/2015   Procedure: CESAREAN SECTION;  Surgeon: Reva Bores, MD;  Location: [facility];  Service: Obstetrics;  Laterality: N/A;  . INTRAUTERINE DEVICE INSERTION     [place]  . TONSILLECTOMY  AGE 2  . WISDOM TOOTH EXTRACTION  AGE 35   Social History   Tobacco Use  . Smoking status: Never Smoker  . Smokeless tobacco: Never Used  Substance Use Topics  . Alcohol use: Never    Frequency: Never   family history includes Birth defects in her son; Miscarriages / Stillbirths in her maternal grandmother; Parkinsonism in her maternal grandmother; Rheum arthritis in  her father.  ROS: negative except as noted in the HPI  Medications: Current Outpatient Medications  Medication Sig Dispense Refill  . MULTIPLE VITAMINS-MINERALS PO Take by mouth.     No current facility-administered medications for this visit.    Allergies  Allergen Reactions  . Sulfa Antibiotics Swelling and Rash  . Amoxicillin Hives and Itching  . Erythromycin Hives and Itching  . Penicillins Hives and Nausea And Vomiting    Has patient had a PCN reaction causing immediate rash, facial/tongue/throat swelling, SOB or lightheadedness with hypotension: No Has patient had a PCN reaction causing severe rash involving mucus membranes or skin necrosis: No Has patient had a PCN reaction that required hospitalization No Has patient had a PCN reaction occurring within the last 10 years: No If all of the above answers are "NO", then may proceed with Cephalosporin use.        Objective:  BP 118/77   Pulse  82   Ht 5\' 2"  (1.575 m)   Wt 218 lb (98.9 kg)   LMP 11/19/2017   BMI 39.87 kg/m  Gen:  alert, not ill-appearing, no distress, appropriate for age, obese female HEENT: head normocephalic without obvious abnormality, conjunctiva and cornea clear, bilateral external ear concha with scaly erythema, no debris or edema of the ear canals, TM's pearly gray and semi-transparent Pulm: Normal work of breathing, normal phonation GU: vulva without rashes or lesions, normal introitus and urethral meatus, vaginal mucosa without erythema, normal discharge, cervix non-friable without lesions Neuro: alert and oriented x 3, no tremor MSK: extremities atraumatic, normal gait and station Skin: intact, no rashes on exposed skin, no jaundice, no cyanosis Psych: well-groomed, cooperative, good eye contact, euthymic mood, affect is not mood-congruent, she is tearful, speech is articulate, and thought processes clear and goal-directed  A chaperone was used for the GU portion of the exam, Ellen [facility], CMA.    No results found for this or any previous visit (from the past 72 hour(s)). No results found.    Assessment and Plan: 37 y.o. female with   Encounter to establish care  PMDD (premenstrual dysphoric disorder) - Plan: citalopram (CELEXA) 10 MG tablet  Dermatitis of external ear - Plan: clobetasol cream (TEMOVATE) 0.05 %  Encounter for Papanicolaou smear of cervix - Plan: Cytology - PAP  Screening for thyroid disorder - Plan: TSH  Screening for lipid disorders - Plan: Lipid Panel w/reflex Direct LDL  Screening for blood disease - Plan: CBC, Comprehensive metabolic panel  Screening for diabetes mellitus - Plan: Comprehensive metabolic panel  - Personally reviewed PMH, PSH, PFH, medications, allergies, HM - Age-appropriate cancer screening: Pap smear collected today, mammogram beginning age 37  - Influenza n/a - Tdap UTD per patient - PHQ2 negative  Patient education and anticipatory guidance  given Patient agrees with treatment plan Follow-up based on Pap results or sooner as needed  Levonne Hubertharley E. Nacole Fluhr PA-C

## 2017-12-03 NOTE — Patient Instructions (Addendum)
For ear: Apply Clobetasol to external ear twice a day for 2 weeks. Then use twice a day on weekends The ear should be protected from water by placing a cotton ball coated with petroleum jelly in the ear canal while bathing.  Avoid swimming/water sports when it is actively inflammed/irritated.  the ear canal is self-cleaning and that fingers, towels, cotton swabs, or other foreign objects should not be inserted.  For mood: - start Citalopram 10 mg at bedtime - send me a MyChart message in 2 weeks to update me on how you are feeling   Premenstrual Syndrome Premenstrual syndrome (PMS) is a group of physical, emotional, and behavioral symptoms that affect women of childbearing age. PMS starts 1-2 weeks before the start of a woman's period and goes away a few days after the period starts. It often recurs in a predictable pattern. PMS can range from mild to severe. When it is severe, it is called premenstrual dysphoric disorder (PMDD). PMS can interfere in many ways with normal daily activities. What are the causes? The cause of this condition is not known, but it seems to be related to hormone changes that happen before menstruation. What are the signs or symptoms? Symptoms of this condition often happen every month. They go away completely after your period starts. Physical symptoms include:  Bloating.  Breast pain.  Headaches.  Extreme fatigue.  Backaches.  Swelling of the hands and feet.  Weight gain.  Hot flashes.  Emotional and behavioral symptoms include:  Mood swings.  Depression.  Angry outbursts.  Irritability.  Anxiety.  Crying spells.  Food cravings or appetite changes.  Changes in sexual desire.  Confusion.  Aggression.  Social withdrawal.  Poor concentration.  How is this diagnosed? This condition is diagnosed if symptoms of PMS:  Are present in the 5 days before your period starts.  End within 4 days after your period starts.  Happen at  least 3 months in a row.  Interfere with some of your normal activities.  Other conditions that can cause some of these symptoms must be ruled out before PMS can be diagnosed. How is this treated? This condition may be treated by:  Maintaining a healthy lifestyle. This includes eating a balanced diet and exercising regularly.  Taking medicines. Medicines can help relieve symptoms such as cramps, aches, pains, headaches, and breast tenderness. Depending on the severity of the condition, your health care provider may recommend: ? Over-the-counter pain medicines. ? Prescription medicines for PMDD.  Follow these instructions at home: Eating and drinking   Eat a well-balanced diet.  Avoid caffeine and alcohol.  Limit the amount of salt and salty foods you eat. This will help lessen bloating.  Drink enough fluid to keep your urine clear or pale yellow.  Take a multivitamin if told to by your health care provider. Lifestyle  Do not use any tobacco products, such as cigarettes, chewing tobacco, and e-cigarettes. If you need help quitting, ask your health care provider.  Exercise regularly as suggested by your health care provider.  Get enough sleep.  Practice relaxation techniques.  Limit stress. Other Instructions  For 2-3 months, write down your symptoms, their severity, and how long they last. This will help your health care provider choose the best treatment for you.  Take over-the-counter and prescription medicines only as told by your health care provider.  If you are using oral contraceptive pills, use them as told by your health care provider. This information is not intended to replace  advice given to you by your health care provider. Make sure you discuss any questions you have with your health care provider. Document Released: 05/03/2000 Document Revised: 06/07/2015 Document Reviewed: 02/03/2015 Elsevier Interactive Patient Education  2018 [employer].  Atopic  Dermatitis Atopic dermatitis is a skin disorder that causes inflammation of the skin. This is the most common type of eczema. Eczema is a group of skin conditions that cause the skin to be itchy, red, and swollen. This condition is generally worse during the cooler winter months and often improves during the warm summer months. Symptoms can vary from person to person. Atopic dermatitis usually starts showing signs in infancy and can last through adulthood. This condition cannot be passed from one person to another (non-contagious), but it is more common in families. Atopic dermatitis may not always be present. When it is present, it is called a flare-up. What are the causes? The exact cause of this condition is not known. Flare-ups of the condition may be triggered by:  Contact with something that you are sensitive or allergic to.  Stress.  Certain foods.  Extremely hot or cold weather.  Harsh chemicals and soaps.  Dry air.  Chlorine.  What increases the risk? This condition is more likely to develop in people who have a personal history or family history of eczema, allergies, asthma, or hay fever. What are the signs or symptoms? Symptoms of this condition include:  Dry, scaly skin.  Red, itchy rash.  Itchiness, which can be severe. This may occur before the skin rash. This can make sleeping difficult.  Skin thickening and cracking that can occur over time.  How is this diagnosed? This condition is diagnosed based on your symptoms, a medical history, and a physical exam. How is this treated? There is no cure for this condition, but symptoms can usually be controlled. Treatment focuses on:  Controlling the itchiness and scratching. You may be given medicines, such as antihistamines or steroid creams.  Limiting exposure to things that you are sensitive or allergic to (allergens).  Recognizing situations that cause stress and developing a plan to manage stress.  If your  atopic dermatitis does not get better with medicines, or if it is all over your body (widespread), a treatment using a specific type of light (phototherapy) may be used. Follow these instructions at home: Skin care  Keep your skin well-moisturized. Doing this seals in moisture and helps to prevent dryness. ? Use unscented lotions that have petroleum in them. ? Avoid lotions that contain alcohol or water. They can dry the skin.  Keep baths or showers short (less than 5 minutes) in warm water. Do not use hot water. ? Use mild, unscented cleansers for bathing. Avoid soap and bubble bath. ? Apply a moisturizer to your skin right after a bath or shower.  Do not apply anything to your skin without checking with your health care provider. General instructions  Dress in clothes made of cotton or cotton blends. Dress lightly because heat increases itchiness.  When washing your clothes, rinse your clothes twice so all of the soap is removed.  Avoid any triggers that can cause a flare-up.  Try to manage your stress.  Keep your fingernails cut short.  Avoid scratching. Scratching makes the rash and itchiness worse. It may also result in a skin infection (impetigo) due to a break in the skin caused by scratching.  Take or apply over-the-counter and prescription medicines only as told by your health care provider.  Keep all follow-up visits as told by your health care provider. This is important.  Do not be around people who have cold sores or fever blisters. If you get the infection, it may cause your atopic dermatitis to worsen. Contact a health care provider if:  Your itchiness interferes with sleep.  Your rash gets worse or it is not better within one week of starting treatment.  You have a fever.  You have a rash flare-up after having contact with someone who has cold sores or fever blisters. Get help right away if:  You develop pus or soft yellow scabs in the rash  area. Summary  This condition causes a red rash and itchy, dry, scaly skin.  Treatment focuses on controlling the itchiness and scratching, limiting exposure to things that you are sensitive or allergic to (allergens), recognizing situations that cause stress, and developing a plan to manage stress.  Keep your skin well-moisturized.  Keep baths or showers shorter than 5 minutes and use warm water. Do not use hot water. This information is not intended to replace advice given to you by your health care provider. Make sure you discuss any questions you have with your health care provider. Document Released: 05/03/2000 Document Revised: 06/07/2016 Document Reviewed: 06/07/2016 Elsevier Interactive Patient Education  [employer]2018 Elsevier Inc.

## 2017-12-04 LAB — CBC
HCT: 39.2 % (ref 35.0–45.0)
HEMOGLOBIN: 13.6 g/dL (ref 11.7–15.5)
MCH: 30.6 pg (ref 27.0–33.0)
MCHC: 34.7 g/dL (ref 32.0–36.0)
MCV: 88.3 fL (ref 80.0–100.0)
MPV: 11.1 fL (ref 7.5–12.5)
PLATELETS: 191 10*3/uL (ref 140–400)
RBC: 4.44 10*6/uL (ref 3.80–5.10)
RDW: 13 % (ref 11.0–15.0)
WBC: 8.4 10*3/uL (ref 3.8–10.8)

## 2017-12-04 LAB — COMPREHENSIVE METABOLIC PANEL
AG RATIO: 2 (calc) (ref 1.0–2.5)
ALKALINE PHOSPHATASE (APISO): 50 U/L (ref 33–115)
ALT: 18 U/L (ref 6–29)
AST: 14 U/L (ref 10–30)
Albumin: 4.4 g/dL (ref 3.6–5.1)
BILIRUBIN TOTAL: 0.4 mg/dL (ref 0.2–1.2)
BUN: 11 mg/dL (ref 7–25)
CALCIUM: 9 mg/dL (ref 8.6–10.2)
CO2: 23 mmol/L (ref 20–32)
Chloride: 105 mmol/L (ref 98–110)
Creat: 0.77 mg/dL (ref 0.50–1.10)
Globulin: 2.2 g/dL (calc) (ref 1.9–3.7)
Glucose, Bld: 96 mg/dL (ref 65–99)
POTASSIUM: 3.9 mmol/L (ref 3.5–5.3)
SODIUM: 137 mmol/L (ref 135–146)
TOTAL PROTEIN: 6.6 g/dL (ref 6.1–8.1)

## 2017-12-04 LAB — LIPID PANEL W/REFLEX DIRECT LDL
Cholesterol: 209 mg/dL — ABNORMAL HIGH (ref <200)
HDL: 37 mg/dL — ABNORMAL LOW (ref >50)
LDL CHOLESTEROL (CALC): 127 mg/dL — AB
Non-HDL Cholesterol (Calc): 172 mg/dL (calc) — ABNORMAL HIGH (ref <130)
TRIGLYCERIDES: 318 mg/dL — AB (ref <150)
Total CHOL/HDL Ratio: 5.6 (calc) — ABNORMAL HIGH (ref <5.0)

## 2017-12-04 LAB — TSH: TSH: 3.49 m[IU]/L

## 2017-12-05 LAB — CYTOLOGY - PAP
Diagnosis: NEGATIVE
HPV: NOT DETECTED

## 2017-12-07 ENCOUNTER — Encounter (INDEPENDENT_AMBULATORY_CARE_PROVIDER_SITE_OTHER): Payer: Self-pay | Admitting: Orthopedic Surgery

## 2017-12-07 NOTE — Progress Notes (Signed)
Office Visit Note   Patient: [facility]           Date of Birth: May 28, 1980           MRN: [id number] Visit Date: 12/03/2017 Requested by: Ellen Bectonhekkekandam, Thomas J, MD [address] [place], [place] [id number] PCP: Ellen [facility], [place]  Subjective: Chief Complaint  Patient presents with  . Right Shoulder - Pain    HPI: Ellen DikeJennifer is a patient with right shoulder pain for over 4 years.  The pain became worse last year.  She had an ultrasound-guided needle lavage of calcific tendinitis which did not really help her much.  She is out of hydrocodone.  She takes care of 5 children.  She was diagnosed with calcific tendinopathy in 2015.  It is hard for her to sleep at night.  She has a history of cortisone injections.              ROS: All systems reviewed are negative as they relate to the chief complaint within the history of present illness.  Patient denies  fevers or chills.   Assessment & Plan: Visit Diagnoses:  1. Chronic right shoulder pain   2. Calcific tendinitis     Plan: Impression is symptomatic persistent tendinopathy involving the right shoulder.  Plan MRI arthrogram of the right shoulder to evaluate size of the tendinopathy and potential treatment options.  She may have to have that tendinopathy debrided the rotator cuff reattached.  I will see her back after that study  Follow-Up Instructions: Return for after MRI.   Orders:  Orders Placed This Encounter  Procedures  . MR SHOULDER RIGHT W CONTRAST  . Arthrogram   No orders of the defined types were placed in this encounter.     Procedures: No procedures performed   Clinical Data: No additional findings.  Objective: Vital Signs: LMP 11/19/2017   Physical Exam:   Constitutional: Patient appears well-developed HEENT:  Head: Normocephalic Eyes:EOM are normal Neck: Normal range of motion Cardiovascular: Normal rate Pulmonary/chest: Effort normal Neurologic: Patient is  alert Skin: Skin is warm Psychiatric: Patient has normal mood and affect    Ortho Exam: Straits good cervical spine range of motion.  Patient does have pain with range of motion passively of the shoulder.  The shoulder girdle region.  Rotator cuff strength is weak to supraspinatus testing compared to infraspinatus and subscapularis particularly on the left-hand side.  Specialty Comments:  No specialty comments available.  Imaging: No results found.   PMFS History: Patient Active Problem List   Diagnosis Date Noted  . PMDD (premenstrual dysphoric disorder) 12/03/2017  . Dermatitis of external ear 12/03/2017  . Calcific tendinitis of right shoulder 10/27/2017  . Term pregnancy delivered 04/12/2015  . Back pain affecting pregnancy in third trimester 01/09/2015  . Recurrent UTI (urinary tract infection) complicating pregnancy 01/06/2015  . Previous cesarean delivery affecting pregnancy, antepartum 09/26/2014  . AMA (advanced maternal age) multigravida 35+ 09/06/2014  . Supervision of normal pregnancy 08/29/2014   Past Medical History:  Diagnosis Date  . Complication of anesthesia    ITCHING  . H/O varicella   . Infection    UTI WITH PREGNANCY  . Migraine    MIGRAINES  . Yeast infection     Family History  Problem Relation Age of Onset  . Rheum arthritis Father   . Birth defects Son        HAS ONLY ONE KIDNEY  . Miscarriages / Stillbirths Maternal Grandmother   .  Parkinsonism Maternal Grandmother     Past Surgical History:  Procedure Laterality Date  . CESAREAN SECTION     c/s times 4   . CESAREAN SECTION N/A 04/12/2015   Procedure: CESAREAN SECTION;  Surgeon: Reva Bores, MD;  Location: [facility];  Service: Obstetrics;  Laterality: N/A;  . INTRAUTERINE DEVICE INSERTION     [place]  . TONSILLECTOMY  AGE 42  . WISDOM TOOTH EXTRACTION  AGE 87   Social History   Occupational History  . Occupation: HOMEMAKER  Tobacco Use  . Smoking status: Never Smoker  .  Smokeless tobacco: Never Used  Substance and Sexual Activity  . Alcohol use: Never    Frequency: Never  . Drug use: Never  . Sexual activity: Yes    Partners: Male    Birth control/protection: None, Surgical

## 2017-12-15 ENCOUNTER — Encounter (INDEPENDENT_AMBULATORY_CARE_PROVIDER_SITE_OTHER): Payer: Self-pay | Admitting: Orthopedic Surgery

## 2017-12-15 MED ORDER — DIAZEPAM 10 MG PO TABS: ORAL_TABLET | ORAL | 0 refills | Status: DC

## 2017-12-15 NOTE — Telephone Encounter (Signed)
I would just do two 10 mg tablets

## 2018-01-01 ENCOUNTER — Encounter (INDEPENDENT_AMBULATORY_CARE_PROVIDER_SITE_OTHER): Payer: Self-pay | Admitting: Orthopedic Surgery

## 2018-01-01 ENCOUNTER — Ambulatory Visit
Admission: RE | Admit: 2018-01-01 | Discharge: 2018-01-01 | Disposition: A | Discharge status: Home / Self Care | Payer: Self-pay | Source: Ambulatory Visit | Attending: Orthopedic Surgery | Admitting: Orthopedic Surgery

## 2018-01-01 DIAGNOSIS — M25511 Pain in right shoulder: Principal | ICD-10-CM

## 2018-01-01 DIAGNOSIS — G8929 Other chronic pain: Secondary | ICD-10-CM

## 2018-01-01 MED ORDER — IOPAMIDOL (ISOVUE-M 200) INJECTION 41%
15.0000 mL | Freq: Once | INTRAMUSCULAR | Status: AC
  Administered 2018-01-01: 15 mL via INTRA_ARTICULAR

## 2018-01-02 ENCOUNTER — Other Ambulatory Visit (INDEPENDENT_AMBULATORY_CARE_PROVIDER_SITE_OTHER): Payer: Self-pay

## 2018-01-02 MED ORDER — TRAMADOL HCL 50 MG PO TABS: ORAL_TABLET | ORAL | 0 refills | Status: DC

## 2018-01-02 NOTE — Telephone Encounter (Signed)
Ok to rf at that dose # 50 pls call thx

## 2018-01-05 ENCOUNTER — Ambulatory Visit: Payer: Self-pay | Admitting: Physician Assistant

## 2018-01-22 ENCOUNTER — Other Ambulatory Visit: Payer: Self-pay | Admitting: Physician Assistant

## 2018-01-22 ENCOUNTER — Encounter: Payer: Self-pay | Admitting: Physician Assistant

## 2018-01-22 DIAGNOSIS — F3281 Premenstrual dysphoric disorder: Secondary | ICD-10-CM

## 2018-01-26 ENCOUNTER — Ambulatory Visit (INDEPENDENT_AMBULATORY_CARE_PROVIDER_SITE_OTHER): Payer: Self-pay | Admitting: Orthopedic Surgery

## 2018-01-26 ENCOUNTER — Encounter (INDEPENDENT_AMBULATORY_CARE_PROVIDER_SITE_OTHER): Payer: Self-pay | Admitting: Orthopedic Surgery

## 2018-01-26 DIAGNOSIS — M6520 Calcific tendinitis, unspecified site: Secondary | ICD-10-CM

## 2018-01-26 NOTE — Progress Notes (Signed)
Office Visit Note   Patient: [facility]           Date of Birth: 03/02/1981           MRN: [id number] Visit Date: 01/26/2018 Requested by: Carlis Stable, PA-C [address] [place], [place] [address] PCP: Carlis Stable, [place]  Subjective: Chief Complaint  Patient presents with  . Right Shoulder - Follow-up    HPI: Ellen Short is a patient with right shoulder pain.  She has calcific tendinitis which has lasted a little bit longer than normal.  MRI scan is reviewed which shows decreased calcium load compared to prior scans.  She states her range of motion is better but her pain has been persistent.  Denies much in the way of mechanical symptoms.              ROS: All systems reviewed are negative as they relate to the chief complaint within the history of present illness.  Patient denies  fevers or chills.   Assessment & Plan: Visit Diagnoses:  1. Calcific tendinitis     Plan: Impression is refractory calcific tendinitis.  Under ultrasound guidance today and with significant numbing performed the calcific tendinitis is morselized.  This is done with a needle under direct visualization.  If this fails then surgical decompression would be the final option.  I do want her to try to move the shoulder is much as possible to facilitate resorption of the calcium.  If that is not possible then we will proceed with surgical debridement.  His calcium deposit is in the classic location of calcific tendinitis.  Follow-Up Instructions: Return in about 4 weeks (around 02/23/2018).   Orders:  No orders of the defined types were placed in this encounter.  No orders of the defined types were placed in this encounter.     Procedures: No procedures performed   Clinical Data: No additional findings.  Objective: Vital Signs: There were no vitals taken for this visit.  Physical Exam:   Constitutional: Patient appears well-developed HEENT:  Head:  Normocephalic Eyes:EOM are normal Neck: Normal range of motion Cardiovascular: Normal rate Pulmonary/chest: Effort normal Neurologic: Patient is alert Skin: Skin is warm Psychiatric: Patient has normal mood and affect    Ortho Exam: Ortho exam demonstrates full active and passive range of motion of the left shoulder.  On the right-hand side she has pain with flexion and abduction beyond 90 degrees.  Rotator cuff strength is intact but painful with resistance.  No lymphadenopathy present around the shoulder girdle region.  Specialty Comments:  No specialty comments available.  Imaging: No results found.   PMFS History: Patient Active Problem List   Diagnosis Date Noted  . PMDD (premenstrual dysphoric disorder) 12/03/2017  . Dermatitis of external ear 12/03/2017  . Calcific tendinitis of right shoulder 10/27/2017  . Term pregnancy delivered 04/12/2015  . Back pain affecting pregnancy in third trimester 01/09/2015  . Recurrent UTI (urinary tract infection) complicating pregnancy 01/06/2015  . Previous cesarean delivery affecting pregnancy, antepartum 09/26/2014  . AMA (advanced maternal age) multigravida 35+ 09/06/2014  . Supervision of normal pregnancy 08/29/2014   Past Medical History:  Diagnosis Date  . Complication of anesthesia    ITCHING  . H/O varicella   . Infection    UTI WITH PREGNANCY  . Migraine    MIGRAINES  . Yeast infection     Family History  Problem Relation Age of Onset  . Rheum arthritis Father   .  Birth defects Son        HAS ONLY ONE KIDNEY  . Miscarriages / Stillbirths Maternal Grandmother   . Parkinsonism Maternal Grandmother     Past Surgical History:  Procedure Laterality Date  . CESAREAN SECTION     c/s times 4   . CESAREAN SECTION N/A 04/12/2015   Procedure: CESAREAN SECTION;  Surgeon: Reva Bores, MD;  Location: [facility];  Service: Obstetrics;  Laterality: N/A;  . INTRAUTERINE DEVICE INSERTION     [place]  . TONSILLECTOMY  AGE 43    . WISDOM TOOTH EXTRACTION  AGE 46   Social History   Occupational History  . Occupation: HOMEMAKER  Tobacco Use  . Smoking status: Never Smoker  . Smokeless tobacco: Never Used  Substance and Sexual Activity  . Alcohol use: Never    Frequency: Never  . Drug use: Never  . Sexual activity: Yes    Partners: Male    Birth control/protection: None, Surgical

## 2018-02-25 ENCOUNTER — Ambulatory Visit (INDEPENDENT_AMBULATORY_CARE_PROVIDER_SITE_OTHER): Payer: Self-pay | Admitting: Orthopedic Surgery

## 2018-02-25 ENCOUNTER — Ambulatory Visit (INDEPENDENT_AMBULATORY_CARE_PROVIDER_SITE_OTHER): Payer: Self-pay | Admitting: Osteopathic Medicine

## 2018-02-25 DIAGNOSIS — Z23 Encounter for immunization: Secondary | ICD-10-CM

## 2018-03-09 ENCOUNTER — Encounter: Payer: Self-pay | Admitting: Physician Assistant

## 2018-03-09 ENCOUNTER — Ambulatory Visit (INDEPENDENT_AMBULATORY_CARE_PROVIDER_SITE_OTHER): Payer: Self-pay | Admitting: Physician Assistant

## 2018-03-09 VITALS — BP 103/70 | HR 80 | Temp 98.4°F | Wt 203.0 lb

## 2018-03-09 DIAGNOSIS — K644 Residual hemorrhoidal skin tags: Secondary | ICD-10-CM

## 2018-03-09 DIAGNOSIS — R21 Rash and other nonspecific skin eruption: Secondary | ICD-10-CM

## 2018-03-09 MED ORDER — PRAMOXINE HCL 1 % RE FOAM: 1.0000 "application " | Freq: Three times a day (TID) | RECTAL | 0 refills | Status: DC | PRN

## 2018-03-09 MED ORDER — DOCUSATE SODIUM 100 MG PO CAPS: 100.0000 mg | ORAL_CAPSULE | Freq: Two times a day (BID) | ORAL | 3 refills | Status: DC

## 2018-03-09 MED ORDER — HYDROCORTISONE 2.5 % RE CREA: 1.0000 "application " | TOPICAL_CREAM | Freq: Two times a day (BID) | RECTAL | 2 refills | Status: DC | PRN

## 2018-03-09 MED ORDER — BARRIER CREAM NON-SPECIFIED: 1.0000 "application " | TOPICAL_CREAM | Freq: Two times a day (BID) | TOPICAL | 1 refills | Status: DC | PRN

## 2018-03-09 MED ORDER — HYDROCORTISONE ACETATE 25 MG RE SUPP: 25.0000 mg | Freq: Two times a day (BID) | RECTAL | 2 refills | Status: DC | PRN

## 2018-03-09 NOTE — Patient Instructions (Signed)
Hemorrhoids Hemorrhoids are swollen veins in and around the rectum or anus. There are two types of hemorrhoids:  Internal hemorrhoids. These occur in the veins that are just inside the rectum. They may poke through to the outside and become irritated and painful.  External hemorrhoids. These occur in the veins that are outside of the anus and can be felt as a painful swelling or hard lump near the anus.  Most hemorrhoids do not cause serious problems, and they can be managed with home treatments such as diet and lifestyle changes. If home treatments do not help your symptoms, procedures can be done to shrink or remove the hemorrhoids. What are the causes? This condition is caused by increased pressure in the anal area. This pressure may result from various things, including:  Constipation.  Straining to have a bowel movement.  Diarrhea.  Pregnancy.  Obesity.  Sitting for long periods of time.  Heavy lifting or other activity that causes you to strain.  Anal sex.  What are the signs or symptoms? Symptoms of this condition include:  Pain.  Anal itching or irritation.  Rectal bleeding.  Leakage of stool (feces).  Anal swelling.  One or more lumps around the anus.  How is this diagnosed? This condition can often be diagnosed through a visual exam. Other exams or tests may also be done, such as:  Examination of the rectal area with a gloved hand (digital rectal exam).  Examination of the anal canal using a small tube (anoscope).  A blood test, if you have lost a significant amount of blood.  A test to look inside the colon (sigmoidoscopy or colonoscopy).  How is this treated? This condition can usually be treated at home. However, various procedures may be done if dietary changes, lifestyle changes, and other home treatments do not help your symptoms. These procedures can help make the hemorrhoids smaller or remove them completely. Some of these procedures involve  surgery, and others do not. Common procedures include:  Rubber band ligation. Rubber bands are placed at the base of the hemorrhoids to cut off the blood supply to them.  Sclerotherapy. Medicine is injected into the hemorrhoids to shrink them.  Infrared coagulation. A type of light energy is used to get rid of the hemorrhoids.  Hemorrhoidectomy surgery. The hemorrhoids are surgically removed, and the veins that supply them are tied off.  Stapled hemorrhoidopexy surgery. A circular stapling device is used to remove the hemorrhoids and use staples to cut off the blood supply to them.  Follow these instructions at home: Eating and drinking  Eat foods that have a lot of fiber in them, such as whole grains, beans, nuts, fruits, and vegetables. Ask your health care provider about taking products that have added fiber (fiber supplements).  Drink enough fluid to keep your urine clear or pale yellow. Managing pain and swelling  Take warm sitz baths for 20 minutes, 3-4 times a day to ease pain and discomfort.  If directed, apply ice to the affected area. Using ice packs between sitz baths may be helpful. ? Put ice in a plastic bag. ? Place a towel between your skin and the bag. ? Leave the ice on for 20 minutes, 2-3 times a day. General instructions  Take over-the-counter and prescription medicines only as told by your health care provider.  Use medicated creams or suppositories as told.  Exercise regularly.  Go to the bathroom when you have the urge to have a bowel movement. Do not wait.    Avoid straining to have bowel movements.  Keep the anal area dry and clean. Use wet toilet paper or moist towelettes after a bowel movement.  Do not sit on the toilet for long periods of time. This increases blood pooling and pain. Contact a health care provider if:  You have increasing pain and swelling that are not controlled by treatment or medicine.  You have uncontrolled bleeding.  You  have difficulty having a bowel movement, or you are unable to have a bowel movement.  You have pain or inflammation outside the area of the hemorrhoids. This information is not intended to replace advice given to you by your health care provider. Make sure you discuss any questions you have with your health care provider. Document Released: 05/03/2000 Document Revised: 10/04/2015 Document Reviewed: 01/18/2015 Elsevier Interactive Patient Education  2018 [employer]  

## 2018-03-09 NOTE — Progress Notes (Signed)
HPI:                                                                Ellen Short is a 37 y.o. female who presents to [name]: [facility] today for rectal discomfort  Reports approx every 3 months she is having a stinging, bleeding, and itching "cut" in the intergluteal cleft. She notices this when sitting on the toilet and any activity that causes spreading of the buttocks. Denies drainage or warmth.   She has a history of hemorrhoids and reports they were flaring up last week. She has tried Annusol suppositories with moderate relief.    Past Medical History:  Diagnosis Date  . Complication of anesthesia    ITCHING  . H/O varicella   . Infection    UTI WITH PREGNANCY  . Migraine    MIGRAINES  . Yeast infection    Past Surgical History:  Procedure Laterality Date  . CESAREAN SECTION     c/s times 4   . CESAREAN SECTION N/A 04/12/2015   Procedure: CESAREAN SECTION;  Surgeon: Reva Bores, MD;  Location: [facility];  Service: Obstetrics;  Laterality: N/A;  . INTRAUTERINE DEVICE INSERTION     [place]  . TONSILLECTOMY  AGE 40  . WISDOM TOOTH EXTRACTION  AGE 18   Social History   Tobacco Use  . Smoking status: Never Smoker  . Smokeless tobacco: Never Used  Substance Use Topics  . Alcohol use: Never    Frequency: Never   family history includes Birth defects in her son; Miscarriages / Stillbirths in her maternal grandmother; Parkinsonism in her maternal grandmother; Rheum arthritis in her father.    ROS: negative except as noted in the HPI  Medications: Current Outpatient Medications  Medication Sig Dispense Refill  . barrier cream (NON-SPECIFIED) CREA Apply 1 application topically 2 (two) times daily as needed. 1 each 1  . citalopram (CELEXA) 10 MG tablet Take 1 tablet (10 mg total) by mouth at bedtime. 90 tablet 0  . clobetasol cream (TEMOVATE) 0.05 % Apply 1 application topically 2 (two) times daily. 15 g 3  . docusate  sodium (COLACE) 100 MG capsule Take 1 capsule (100 mg total) by mouth 2 (two) times daily. 90 capsule 3  . hydrocortisone (ANUSOL-HC) 2.5 % rectal cream Place 1 application rectally 2 (two) times daily as needed for hemorrhoids or anal itching. 28.35 g 2  . hydrocortisone (ANUSOL-HC) 25 MG suppository Place 1 suppository (25 mg total) rectally 2 (two) times daily as needed for hemorrhoids. 24 suppository 2  . MULTIPLE VITAMINS-MINERALS PO Take by mouth.    . pramoxine (PROCTOFOAM) 1 % foam Place 1 application rectally 3 (three) times daily as needed for anal itching. 15 g 0  . traMADol (ULTRAM) 50 MG tablet 1 po q d to bid prn pain 60 tablet 0   No current facility-administered medications for this visit.    Allergies  Allergen Reactions  . Sulfa Antibiotics Swelling and Rash  . Amoxicillin Hives and Itching  . Erythromycin Hives and Itching  . Penicillins Hives and Nausea And Vomiting    Has patient had a PCN reaction causing immediate rash, facial/tongue/throat swelling, SOB or lightheadedness with hypotension: No Has patient had a PCN reaction  causing severe rash involving mucus membranes or skin necrosis: No Has patient had a PCN reaction that required hospitalization No Has patient had a PCN reaction occurring within the last 10 years: No If all of the above answers are "NO", then may proceed with Cephalosporin use.        Objective:  BP 103/70   Pulse 80   Temp 98.4 F (36.9 C) (Oral)   Wt 203 lb (92.1 kg)   BMI 37.13 kg/m  Gen:  alert, not ill-appearing, no distress, appropriate for age, obese female HEENT: head normocephalic without obvious abnormality, conjunctiva and cornea clear, trachea midline Pulm: Normal work of breathing, normal phonation Neuro: alert and oriented x 3, no tremor MSK: extremities atraumatic, normal gait and station Skin: intact, linear red rash approx 4.5 cm in length of the intergluteal cleft   A chaperone was used for the rectal exam, Ardell Isaacs, RMA  No results found for this or any previous visit (from the past 72 hour(s)). No results found.    Assessment and Plan: 37 y.o. female with   .Diagnoses and all orders for this visit:  Rash and nonspecific skin eruption -     Ambulatory referral to Gastroenterology -     barrier cream (NON-SPECIFIED) CREA; Apply 1 application topically 2 (two) times daily as needed.  External hemorrhoid -     Ambulatory referral to Gastroenterology -     pramoxine (PROCTOFOAM) 1 % foam; Place 1 application rectally 3 (three) times daily as needed for anal itching. -     hydrocortisone (ANUSOL-HC) 25 MG suppository; Place 1 suppository (25 mg total) rectally 2 (two) times daily as needed for hemorrhoids. -     docusate sodium (COLACE) 100 MG capsule; Take 1 capsule (100 mg total) by mouth 2 (two) times daily.       Patient education and anticipatory guidance given Patient agrees with treatment plan Follow-up as needed if symptoms worsen or fail to improve  Levonne Hubert PA-C

## 2018-03-11 ENCOUNTER — Encounter (INDEPENDENT_AMBULATORY_CARE_PROVIDER_SITE_OTHER): Payer: Self-pay | Admitting: Orthopedic Surgery

## 2018-03-11 ENCOUNTER — Ambulatory Visit (INDEPENDENT_AMBULATORY_CARE_PROVIDER_SITE_OTHER): Payer: Self-pay | Admitting: Orthopedic Surgery

## 2018-03-11 DIAGNOSIS — M6520 Calcific tendinitis, unspecified site: Secondary | ICD-10-CM

## 2018-03-11 NOTE — Progress Notes (Signed)
Office Visit Note   Patient: Ellen Short           Date of Birth: [birth year]           MRN: [id number] Visit Date: 03/11/2018 Requested by: Carlis Stable, PA-C [id number] [place], [place] [address] PCP: Carlis Stable, [place]  Subjective: Chief Complaint  Patient presents with  . Right Shoulder - Follow-up    HPI: Ellen Short is a patient with right shoulder pain.  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  She is had 2 injections under ultrasound guidance in an attempt to disperse or decompress this calcific tendinitis.  She states that her shoulder is hurting all the time.  She is had 4 years of symptoms.  Does not report much in the way of neck pain but does report very seldom numbness and tingling in the fingers.  Most of her symptoms she localizes to the anterior and lateral aspect of the deltoid region on that right shoulder.              ROS: All systems reviewed are negative as they relate to the chief complaint within the history of present illness.  Patient denies  fevers or chills.   Assessment & Plan: Visit Diagnoses:  1. 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  Discussed operative and nonoperative treatment options today.  Operative treatment options include shoulder arthroscopy with decompression of that calcific deposit.  I think that may help her some.  The rest of her shoulder looks pretty reasonable.  Conceivable that after debridement of the calcium deposit she may need some rotator cuff work done but I think it is unlikely.  Also a chance that this may not help her shoulder symptoms to the degree that she would like.  She has had 4 years of symptoms which is a little bit longer than the natural history of calcific tendinitis.  Nonetheless I think she is at the point now after the symptoms that she would like intervention  which I think is reasonable.  I think there is a reasonable chance that we can improve but not alleviate all of her pain.  Follow-Up Instructions: No follow-ups on file.   Orders:  No orders of the defined types were placed in this encounter.  No orders of the defined types were placed in this encounter.     Procedures: No procedures performed   Clinical Data: No additional findings.  Objective: Vital Signs: There were no vitals taken for this visit.  Physical Exam:   Constitutional: Patient appears well-developed HEENT:  Head: Normocephalic Eyes:EOM are normal Neck: Normal range of motion Cardiovascular: Normal rate Pulmonary/chest: Effort normal Neurologic: Patient is alert Skin: Skin is warm Psychiatric: Patient has normal mood and affect    Ortho Exam: Ortho exam demonstrates full active and passive range of motion of the left shoulder.  On the right-hand side there is no passive restriction of external rotation of 15 degrees of abduction.  In general motion of the shoulder is painful.  Neck range of motion is intact and there is no paresthesias in the right arm.  Radial pulses intact.  No other masses lymph adenopathy or skin changes noted in that right shoulder girdle region.  Specialty Comments:  No specialty comments available.  Imaging: No results found.   PMFS History: Patient Active Problem List   Diagnosis Date Noted  . 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 External hemorrhoid 03/09/2018  . PMDD (premenstrual dysphoric disorder) 12/03/2017  . Dermatitis of external ear 12/03/2017  . Calcific tendinitis of right shoulder 10/27/2017  . Term pregnancy delivered 04/12/2015  . Back pain affecting pregnancy in third trimester 01/09/2015  . Recurrent UTI (urinary tract infection) complicating pregnancy 01/06/2015  . Previous cesarean delivery affecting pregnancy, antepartum 09/26/2014  . AMA (advanced maternal age) multigravida 35+ 09/06/2014  . Supervision of  normal pregnancy 08/29/2014   Past Medical History:  Diagnosis Date  . Complication of anesthesia    ITCHING  . H/O varicella   . Infection    UTI WITH PREGNANCY  . Migraine    MIGRAINES  . Yeast infection     Family History  Problem Relation Age of Onset  . Rheum arthritis Father   . Birth defects Son        HAS ONLY ONE KIDNEY  . Miscarriages / Stillbirths Maternal Grandmother   . Parkinsonism Maternal Grandmother     Past Surgical History:  Procedure Laterality Date  . CESAREAN SECTION     c/s times 4   . CESAREAN SECTION N/A 04/12/2015   Procedure: CESAREAN SECTION;  Surgeon: Reva Bores, MD;  Location: [facility];  Service: Obstetrics;  Laterality: N/A;  . INTRAUTERINE DEVICE INSERTION     [place]  . TONSILLECTOMY  AGE 59  . WISDOM TOOTH EXTRACTION  AGE 33   Social History   Occupational History  . Occupation: HOMEMAKER  Tobacco Use  . Smoking status: Never Smoker  . Smokeless tobacco: Never Used  Substance and Sexual Activity  . Alcohol use: Never    Frequency: Never  . Drug use: Never  . Sexual activity: Yes    Partners: Male    Birth control/protection: None, Surgical

## 2018-03-22 ENCOUNTER — Encounter: Payer: Self-pay | Admitting: Physician Assistant

## 2018-03-30 ENCOUNTER — Telehealth (INDEPENDENT_AMBULATORY_CARE_PROVIDER_SITE_OTHER): Payer: Self-pay | Admitting: Orthopedic Surgery

## 2018-03-30 NOTE — Telephone Encounter (Signed)
Patient called stating that she asked Debbie 2 weeks for a itemized bill of what her surgery will cost so that they can submit it to her husband's 401[employer] company.  They are borrowing against it so that she can have the surgery.  She stated that she has not received the itemized bill and is now very upset and would like to speak to someone.  [phone].  Thank you.

## 2018-04-03 ENCOUNTER — Other Ambulatory Visit (INDEPENDENT_AMBULATORY_CARE_PROVIDER_SITE_OTHER): Payer: Self-pay | Admitting: Orthopedic Surgery

## 2018-04-03 NOTE — Telephone Encounter (Signed)
Ok to rf? 

## 2018-04-04 NOTE — Telephone Encounter (Signed)
y

## 2018-04-23 ENCOUNTER — Encounter: Payer: Self-pay | Admitting: Physician Assistant

## 2018-05-23 ENCOUNTER — Other Ambulatory Visit: Payer: Self-pay | Admitting: Physician Assistant

## 2018-05-23 DIAGNOSIS — F3281 Premenstrual dysphoric disorder: Secondary | ICD-10-CM

## 2018-06-09 ENCOUNTER — Other Ambulatory Visit (INDEPENDENT_AMBULATORY_CARE_PROVIDER_SITE_OTHER): Payer: Self-pay | Admitting: Orthopedic Surgery

## 2018-06-09 NOTE — Telephone Encounter (Signed)
Ok for refill? 

## 2018-06-10 NOTE — Telephone Encounter (Signed)
y

## 2018-06-10 NOTE — Telephone Encounter (Signed)
rx called to pharmacy 

## 2018-06-12 ENCOUNTER — Ambulatory Visit (INDEPENDENT_AMBULATORY_CARE_PROVIDER_SITE_OTHER): Payer: Self-pay | Admitting: Physician Assistant

## 2018-06-12 ENCOUNTER — Encounter: Payer: Self-pay | Admitting: Physician Assistant

## 2018-06-12 VITALS — BP 139/87 | HR 97 | Wt 205.0 lb

## 2018-06-12 DIAGNOSIS — L304 Erythema intertrigo: Secondary | ICD-10-CM

## 2018-06-12 DIAGNOSIS — G43009 Migraine without aura, not intractable, without status migrainosus: Secondary | ICD-10-CM

## 2018-06-12 DIAGNOSIS — F3281 Premenstrual dysphoric disorder: Secondary | ICD-10-CM

## 2018-06-12 MED ORDER — TOPIRAMATE 25 MG PO TABS: ORAL_TABLET | ORAL | 1 refills | Status: DC

## 2018-06-12 MED ORDER — DICLOFENAC POTASSIUM 50 MG PO TABS: 50.0000 mg | ORAL_TABLET | Freq: Once | ORAL | 1 refills | Status: DC | PRN

## 2018-06-12 MED ORDER — CITALOPRAM HYDROBROMIDE 10 MG PO TABS: 20.0000 mg | ORAL_TABLET | Freq: Every day | ORAL | 0 refills | Status: DC

## 2018-06-12 MED ORDER — CLOTRIMAZOLE-BETAMETHASONE 1-0.05 % EX CREA: 1.0000 "application " | TOPICAL_CREAM | Freq: Two times a day (BID) | CUTANEOUS | 0 refills | Status: DC

## 2018-06-12 MED ORDER — DEXAMETHASONE SODIUM PHOSPHATE 4 MG/ML IJ SOLN
4.0000 mg | Freq: Once | INTRAMUSCULAR | Status: AC
  Administered 2018-06-12: 4 mg via INTRAMUSCULAR

## 2018-06-12 MED ORDER — KETOROLAC TROMETHAMINE 30 MG/ML IJ SOLN
30.0000 mg | Freq: Once | INTRAMUSCULAR | Status: AC
  Administered 2018-06-12: 30 mg via INTRAMUSCULAR

## 2018-06-12 NOTE — Patient Instructions (Signed)
For your migraines - headache diary (paper or app like MigraineBuddy, iHeadache) - start Topamax for prevention. Follow instructions for titration - take Cambia at the first sign of migraine. Do not use more than once in 24 hours. Do not combine with Ibuprofen or other NSAIDs. Do not use more than 9 days per month - aim to get at least 7-9 hours of sleep per night - drink at least 64 ounces of water per day - reduce caffeine intake - avoid alcohol - don't skip meals  For rash - apply Lotrisone cream twice daily to affected areas for 2 weeks or until rash resolves - follow-up with dermatology   Intertrigo Intertrigo is skin irritation or inflammation (dermatitis) that occurs when folds of skin rub together. The irritation can cause a rash and make skin raw and itchy. This condition most commonly occurs in the skin folds of these areas:  Toes.  Armpits.  Groin.  Under the belly.  Under the breasts.  Buttocks. Intertrigo is not passed from person to person (is not contagious). What are the causes? This condition is caused by heat, moisture, rubbing (friction), and not enough air circulation. The condition can be made worse by:  Sweat.  Bacteria.  A fungus, such as yeast. What increases the risk? This condition is more likely to occur if you have moisture in your skin folds. You are more likely to develop this condition if you:  Have diabetes.  Are overweight.  Are not able to move around or are not active.  Live in a warm and moist climate.  Wear splints, braces, or other medical devices.  Are not able to control your bowels or bladder (have incontinence). What are the signs or symptoms? Symptoms of this condition include:  A pink or red skin rash in the skin fold or near the skin fold.  Raw or scaly skin.  Itchiness.  A burning feeling.  Bleeding.  Leaking fluid.  A bad smell. How is this diagnosed? This condition is diagnosed with a medical history  and physical exam. You may also have a skin swab to test for bacteria or a fungus. How is this treated? This condition may be treated by:  Cleaning and drying your skin.  Taking an antibiotic medicine or using an antibiotic skin cream for a bacterial infection.  Using an antifungal cream on your skin or taking pills for an infection that was caused by a fungus, such as yeast.  Using a steroid ointment to relieve itchiness and irritation.  Separating the skin fold with a clean cotton cloth to absorb moisture and allow air to flow into the area. Follow these instructions at home:  Keep the affected area clean and dry.  Do not scratch your skin.  Stay in a cool environment as much as possible. Use an air conditioner or fan, if available.  Apply over-the-counter and prescription medicines only as told by your health care provider.  If you were prescribed an antibiotic medicine, use it as told by your health care provider. Do not stop using the antibiotic even if your condition improves.  Keep all follow-up visits as told by your health care provider. This is important. How is this prevented?   Maintain a healthy weight.  Take care of your feet, especially if you have diabetes. Foot care includes: ? Wearing shoes that fit well. ? Keeping your feet dry. ? Wearing clean, breathable socks.  Protect the skin around your groin and buttocks, especially if you have incontinence.  Skin protection includes: ? Following a regular cleaning routine. ? Using skin protectant creams, powders, or ointments. ? Changing protection pads frequently.  Do not wear tight clothes. Wear clothes that are loose, absorbent, and made of cotton.  Wear a bra that gives good support, if needed.  Shower and dry yourself well after activity or exercise. Use a hair dryer on a cool setting to dry between skin folds, especially after you bathe.  If you have diabetes, keep your blood sugar under control. Contact  a health care provider if:  Your symptoms do not improve with treatment.  Your symptoms get worse or they spread.  You notice increased redness and warmth.  You have a fever. Summary  Intertrigo is skin irritation or inflammation (dermatitis) that occurs when folds of skin rub together.  This condition is caused by heat, moisture, rubbing (friction), and not enough air circulation.  This condition may be treated by cleaning and drying your skin and with medicines.  Apply over-the-counter and prescription medicines only as told by your health care provider.  Keep all follow-up visits as told by your health care provider. This is important. This information is not intended to replace advice given to you by your health care provider. Make sure you discuss any questions you have with your health care provider. Document Released: 05/06/2005 Document Revised: 10/06/2017 Document Reviewed: 10/06/2017 Elsevier Interactive Patient Education  2019 [employer].

## 2018-06-12 NOTE — Progress Notes (Signed)
HPI:                                                                Ellen Short is a 38 y.o. female who [facility] today for follow-up  Migraines: reports she is having 2-4 headaches per week, gradually worsening for the last 1 month. Admits to increased stress. Typical triggers are ovulation and menses. Excedrin is no longer working for her. In the past she has taken Imitrex, Maxalt, Midrin and Fioricet. She currently has a moderate headache that has been present since yesterday. Typical of her usual migraines.  PMDD: currently taking Celexa 10 mg daily. She would like to increase her dose due to increased stress. She is exercising 3-5 days per week, walking 3-4 miles and doing strength training.  She is still having a lot of itching and discomfort in her intergluteal cleft.  She was unable to follow-up with GI due to cost.  Reports intermittently she is having a stinging, bleeding, and itching "cut" in the intergluteal cleft. She notices this when sitting on the toilet and any activity that causes spreading of the buttocks. Denies drainage or warmth.   Depression screen [facility] 2/[facility] 06/12/2018 12/03/2017 12/23/2014 11/25/2014  Decreased Interest 0 0 0 0  Down, Depressed, Hopeless 1 0 0 0  PHQ - 2 Score 1 0 0 0  Altered sleeping 1 3 - -  Tired, decreased energy 1 2 - -  Change in appetite 0 0 - -  Feeling bad or failure about yourself  1 0 - -  Trouble concentrating 1 2 - -  Moving slowly or fidgety/restless 0 0 - -  Suicidal thoughts 0 0 - -  PHQ-9 Score 5 7 - -    GAD 7 : Generalized Anxiety Score 06/12/2018 12/03/2017  Nervous, Anxious, on Edge 1 1  Control/stop worrying 1 0  Worry too much - different things 1 1  Trouble relaxing 1 1  Restless 0 0  Easily annoyed or irritable 0 1  Afraid - awful might happen 1 1  Total GAD 7 Score 5 5      Past Medical History:  Diagnosis Date  . Complication of anesthesia    ITCHING  . H/O varicella   . Infection    UTI WITH PREGNANCY  . Migraine    MIGRAINES  . Yeast infection    Past Surgical History:  Procedure Laterality Date  . CESAREAN SECTION     c/s times 4   . CESAREAN SECTION N/A 04/12/2015   Procedure: CESAREAN SECTION;  Surgeon: Reva Boresanya S Pratt, MD;  Location: [facility];  Service: Obstetrics;  Laterality: N/A;  . INTRAUTERINE DEVICE INSERTION     [place]  . TONSILLECTOMY  AGE 35  . WISDOM TOOTH EXTRACTION  AGE 35   Social History   Tobacco Use  . Smoking status: Never Smoker  . Smokeless tobacco: Never Used  Substance Use Topics  . Alcohol use: Never    Frequency: Never   family history includes Birth defects in her son; Miscarriages / Stillbirths in her maternal grandmother; Parkinsonism in her maternal grandmother; Rheum arthritis in her father.    ROS: negative except as noted in the HPI  Medications: Current Outpatient Medications  Medication Sig Dispense Refill  . barrier cream (NON-SPECIFIED) CREA Apply 1 application topically 2 (two) times daily as needed. 1 each 1  . citalopram (CELEXA) 10 MG tablet Take 2 tablets (20 mg total) by mouth at bedtime. 180 tablet 0  . clobetasol cream (TEMOVATE) 0.05 % Apply 1 application topically 2 (two) times daily. 15 g 3  . clotrimazole-betamethasone (LOTRISONE) cream Apply 1 application topically 2 (two) times daily. 30 g 0  . diclofenac (CATAFLAM) 50 MG tablet Take 1 tablet (50 mg total) by mouth once as needed for up to 1 dose (migraine). 9 tablet 1  . docusate sodium (COLACE) 100 MG capsule Take 1 capsule (100 mg total) by mouth 2 (two) times daily. 90 capsule 3  . hydrocortisone (ANUSOL-HC) 2.5 % rectal cream Place 1 application rectally 2 (two) times daily as needed for hemorrhoids or anal itching. 28.35 g 2  . MULTIPLE VITAMINS-MINERALS PO Take by mouth.    . topiramate (TOPAMAX) 25 MG tablet Take 1 tablet (25 mg total) by mouth at bedtime for 7 days, THEN 1 tablet (25 mg total) 2 (two)  times daily for 14 days, THEN 2 tablets (50 mg total) 2 (two) times daily. 120 tablet 1  . traMADol (ULTRAM) 50 MG tablet TAKE 1 OR 2 TABLETS BY MOUTH DAILY AS NEEDED FOR PAIN 60 tablet 0   No current facility-administered medications for this visit.    Allergies  Allergen Reactions  . Sulfa Antibiotics Swelling and Rash  . Amoxicillin Hives and Itching  . Erythromycin Hives and Itching  . Penicillins Hives and Nausea And Vomiting    Has patient had a PCN reaction causing immediate rash, facial/tongue/throat swelling, SOB or lightheadedness with hypotension: No Has patient had a PCN reaction causing severe rash involving mucus membranes or skin necrosis: No Has patient had a PCN reaction that required hospitalization No Has patient had a PCN reaction occurring within the last 10 years: No If all of the above answers are "NO", then may proceed with Cephalosporin use.        Objective:  BP 139/87   Pulse 97   Wt 205 lb (93 kg)   BMI 37.49 kg/m  Gen:  alert, not ill-appearing, no distress, appropriate for age, obese female HEENT: head normocephalic without obvious abnormality, conjunctiva and cornea clear, trachea midline Pulm: Normal work of breathing, normal phonation Neuro: alert and oriented x 3, no tremor MSK: extremities atraumatic, normal gait and station Psych: appearance casual, cooperative, good eye contact, euthymic mood, affect mood-congruent, speech rate is fast, volume is loud,  thought processes clear and goal-directed, normal judgment, good insight  BP Readings from Last 3 Encounters:  06/12/18 139/87  03/09/18 103/70  12/03/17 118/77     No results found for this or any previous visit (from the past 72 hour(s)). No results found.    Assessment and Plan: 38 y.o. female with   .Ellen Short was seen today for follow-up.  Diagnoses and all orders for this visit:  Intertrigo -     clotrimazole-betamethasone (LOTRISONE) cream; Apply 1 application topically  2 (two) times daily. -     Ambulatory referral to Dermatology  Migraine without aura and without status migrainosus, not intractable -     topiramate (TOPAMAX) 25 MG tablet; Take 1 tablet (25 mg total) by mouth at bedtime for 7 days, THEN 1 tablet (25 mg total) 2 (two) times daily for 14 days, THEN 2 tablets (50 mg total) 2 (two) times daily. -  diclofenac (CATAFLAM) 50 MG tablet; Take 1 tablet (50 mg total) by mouth once as needed for up to 1 dose (migraine). -     dexamethasone (DECADRON) injection 4 mg -     ketorolac (TORADOL) 30 MG/ML injection 30 mg  PMDD (premenstrual dysphoric disorder) -     citalopram (CELEXA) 10 MG tablet; Take 2 tablets (20 mg total) by mouth at bedtime.   Rash of intergluteal cleft - differential includes intertrigo, erythrasma and lichen sclerosus - trial of Lotrisone bid for 2 weeks - referring to Dermatology for second opinion  PMDD - PHQ9=5, no acute safety issues - increasing Celexa to 20 mg   Migraine - 2-4 headache days per week.  No change in usual headache pattern other than increased frequency in the setting of increased stress.  She is a candidate for migraine prophylaxis.  Starting Topamax self-titrate to 50 mg twice daily -Discontinue Excedrin, adding diclofenac 50 mg as needed for abortive therapy.  Counseled not to combine with other NSAIDs -Migraine cocktail of Toradol and Decadron given in office today  Elevated BP reading BP is typically normotensive. She currently has a migraine. Active surveillance  Patient education and anticipatory guidance given Patient agrees with treatment plan Follow-up in 2 months for migraines/depression or sooner as needed if symptoms worsen or fail to improve  Levonne Hubertharley E. Sixto Bowdish PA-C

## 2018-06-18 ENCOUNTER — Encounter: Payer: Self-pay | Admitting: Physician Assistant

## 2018-06-18 DIAGNOSIS — L304 Erythema intertrigo: Secondary | ICD-10-CM | POA: Insufficient documentation

## 2018-06-18 DIAGNOSIS — G43009 Migraine without aura, not intractable, without status migrainosus: Secondary | ICD-10-CM | POA: Insufficient documentation

## 2018-06-22 ENCOUNTER — Encounter: Payer: Self-pay | Admitting: Physician Assistant

## 2018-06-23 ENCOUNTER — Telehealth: Payer: Self-pay | Admitting: Physician Assistant

## 2018-06-23 ENCOUNTER — Ambulatory Visit: Payer: Self-pay | Admitting: Physician Assistant

## 2018-06-23 DIAGNOSIS — G43009 Migraine without aura, not intractable, without status migrainosus: Secondary | ICD-10-CM

## 2018-06-23 DIAGNOSIS — G43901 Migraine, unspecified, not intractable, with status migrainosus: Secondary | ICD-10-CM

## 2018-06-23 MED ORDER — PROPRANOLOL HCL ER 80 MG PO CP24: 80.0000 mg | ORAL_CAPSULE | Freq: Every day | ORAL | 1 refills | Status: DC

## 2018-06-23 MED ORDER — PROMETHAZINE HCL 12.5 MG PO TABS: 12.5000 mg | ORAL_TABLET | Freq: Three times a day (TID) | ORAL | 0 refills | Status: DC | PRN

## 2018-06-23 MED ORDER — NARATRIPTAN HCL 2.5 MG PO TABS: ORAL_TABLET | ORAL | 0 refills | Status: DC

## 2018-06-23 MED ORDER — TOPIRAMATE 25 MG PO TABS: 25.0000 mg | ORAL_TABLET | Freq: Two times a day (BID) | ORAL | 0 refills | Status: DC

## 2018-06-23 NOTE — Telephone Encounter (Signed)
Patient called and left a message for a return call. I called patient and left a message for a return call.

## 2018-06-23 NOTE — Telephone Encounter (Signed)
She should not take any additional Diclofenac or OTC pain relievers I have sent in Naratriptan She MUST be certain she is not pregnant before taking this medication. It is teratogenic Take 1 tab twice a day for 2 days, then 1 tab a day for 2 days, the 1/2 tab a day for 2 days then off I have also sent in Phenergan for nausea/vomiting. This will cause drowsiness She should not drive or do any activities that require alertness while taking these medications Have her go back down to original dose of Topiramate 25 mg twice a day I will also add Propranolol ER 1 tab at bedtime for additional prevention  Precautions: go to the emergency room for worsening headache, intractable vomiting, vision change, chest pain, weakness or passing out  Needs to follow-up in the office this week

## 2018-06-23 NOTE — Telephone Encounter (Signed)
Patient advised of recommendations. Also advised to go back on the topiramate 25 mg at bedtime.

## 2018-06-23 NOTE — Telephone Encounter (Signed)
Left vm for pt to return call to clinic -EH/RMA  

## 2018-06-23 NOTE — Telephone Encounter (Signed)
Ellen Short states the original dose of the Topiramate was 25 mg at bedtime. Not the Topiramate 25 mg twice daily. Please advise.

## 2018-06-30 ENCOUNTER — Encounter: Payer: Self-pay | Admitting: Physician Assistant

## 2018-06-30 ENCOUNTER — Ambulatory Visit (INDEPENDENT_AMBULATORY_CARE_PROVIDER_SITE_OTHER): Payer: Self-pay | Admitting: Physician Assistant

## 2018-06-30 VITALS — BP 105/73 | HR 87 | Wt 205.0 lb

## 2018-06-30 DIAGNOSIS — G43009 Migraine without aura, not intractable, without status migrainosus: Secondary | ICD-10-CM

## 2018-06-30 MED ORDER — RIZATRIPTAN BENZOATE 10 MG PO TABS: 10.0000 mg | ORAL_TABLET | ORAL | 2 refills | Status: DC | PRN

## 2018-06-30 NOTE — Progress Notes (Signed)
HPI:                                                                Ellen Short is a 38 y.o. female who [facility] [place] [facility] today for migraine follow-up  Patient called office on February 3rd, reporting a severe migraine lasting for 3 days. She associated this migraine with increasing her dose of Topamax. She states she also ovulated around that time, which is a known trigger for her. She was unable to come in to the office. She was prescribed Naratriptan taper and completed this. She was also prescribed Propranolol, but she did not start this due to concern about side effects.   Past Medical History:  Diagnosis Date  . Complication of anesthesia    ITCHING  . H/O varicella   . Infection    UTI WITH PREGNANCY  . Migraine    MIGRAINES  . Yeast infection    Past Surgical History:  Procedure Laterality Date  . CESAREAN SECTION     c/s times 4   . CESAREAN SECTION N/A 04/12/2015   Procedure: CESAREAN SECTION;  Surgeon: Reva Boresanya S Pratt, MD;  Location: [facility];  Service: Obstetrics;  Laterality: N/A;  . INTRAUTERINE DEVICE INSERTION     [place]agard  . TONSILLECTOMY  AGE 28  . WISDOM TOOTH EXTRACTION  AGE 56   Social History   Tobacco Use  . Smoking status: Never Smoker  . Smokeless tobacco: Never Used  Substance Use Topics  . Alcohol use: Never    Frequency: Never   family history includes Birth defects in her son; Miscarriages / Stillbirths in her maternal grandmother; Parkinsonism in her maternal grandmother; Rheum arthritis in her father.    ROS: negative except as noted in the HPI  Medications: Current Outpatient Medications  Medication Sig Dispense Refill  . barrier cream (NON-SPECIFIED) CREA Apply 1 application topically 2 (two) times daily as needed. 1 each 1  . citalopram (CELEXA) 10 MG tablet Take 2 tablets (20 mg total) by mouth at bedtime. 180 tablet 0  . clobetasol cream (TEMOVATE) 0.05 % Apply 1  application topically 2 (two) times daily. 15 g 3  . clotrimazole-betamethasone (LOTRISONE) cream Apply 1 application topically 2 (two) times daily. 30 g 0  . docusate sodium (COLACE) 100 MG capsule Take 1 capsule (100 mg total) by mouth 2 (two) times daily. 90 capsule 3  . hydrocortisone (ANUSOL-HC) 2.5 % rectal cream Place 1 application rectally 2 (two) times daily as needed for hemorrhoids or anal itching. 28.35 g 2  . MULTIPLE VITAMINS-MINERALS PO Take by mouth.    . promethazine (PHENERGAN) 12.5 MG tablet Take 1-2 tablets (12.5-25 mg total) by mouth every 8 (eight) hours as needed for nausea or vomiting. 10 tablet 0  . propranolol ER (INDERAL LA) 80 MG 24 hr capsule Take 1 capsule (80 mg total) by mouth at bedtime. 30 capsule 1  . rizatriptan (MAXALT) 10 MG tablet Take 1 tablet (10 mg total) by mouth as needed for migraine. May repeat in 2 hours if needed 9 tablet 2  . topiramate (TOPAMAX) 25 MG tablet Take 1 tablet (25 mg total) by mouth 2 (two) times daily. 180 tablet 0  . traMADol (ULTRAM) 50 MG tablet  TAKE 1 OR 2 TABLETS BY MOUTH DAILY AS NEEDED FOR PAIN 60 tablet 0   No current facility-administered medications for this visit.    Allergies  Allergen Reactions  . Sulfa Antibiotics Swelling and Rash  . Amoxicillin Hives and Itching  . Erythromycin Hives and Itching  . Penicillins Hives and Nausea And Vomiting    Has patient had a PCN reaction causing immediate rash, facial/tongue/throat swelling, SOB or lightheadedness with hypotension: No Has patient had a PCN reaction causing severe rash involving mucus membranes or skin necrosis: No Has patient had a PCN reaction that required hospitalization No Has patient had a PCN reaction occurring within the last 10 years: No If all of the above answers are "NO", then may proceed with Cephalosporin use.        Objective:  BP 105/73   Pulse 87   Wt 205 lb (93 kg)   BMI 37.49 kg/m  Gen:  alert, not ill-appearing, no distress,  appropriate for age, obese female HEENT: head normocephalic without obvious abnormality, conjunctiva and cornea clear, trachea midline Pulm: Normal work of breathing, normal phonation Neuro: alert and oriented x 3, no tremor MSK: extremities atraumatic, normal gait and station Skin: intact, no rashes on exposed skin, no jaundice, no cyanosis Psych: well-groomed, cooperative, good eye contact, euthymic mood, affect mood-congruent, speech is articulate, and thought processes clear and goal-directed    No results found for this or any previous visit (from the past 72 hour(s)). No results found.    Assessment and Plan: 38 y.o. female with   .Ellen Short was seen today for follow-up.  Diagnoses and all orders for this visit:  Migraine without aura and without status migrainosus, not intractable -     rizatriptan (MAXALT) 10 MG tablet; Take 1 tablet (10 mg total) by mouth as needed for migraine. May repeat in 2 hours if needed   We discussed alternative migraine preventatives. Propranolol or Diltiazem would be good options since she is already taking Celexa. Optional Magnesium and B2 were also recommended. She would like to stick with Topamax for now. There is a menstrual component to her migraines but she reports she is unable to take birth control Re-attempt Topamax 25 mg bid Starting Maxalt 10 mg for abortive therapy    Patient education and anticipatory guidance given Patient agrees with treatment plan Follow-up in 3 months or sooner as needed if symptoms worsen or fail to improve  Levonne Hubertharley E.  PA-C

## 2018-06-30 NOTE — Patient Instructions (Addendum)
Other preventive medications for migraine: - Amitriptyline or Nortriptyline (antidepressants) - Effexor (antidepressant) - Propranolol (beta blocker / blood pressure medication) - Diltiazem (calcium channel blocker / blood pressure medication)  For your migraines: - headache diary (paper or app like MigraineBuddy, iHeadache) - optional B2 (riboflavin) and Magnesium or Migrelief - take Rizatriptan at the first sign of migraine for abortive therapy - may repeat once after 2 hours if headache persists - do not use more than twice in 24 hours. And try not to use Rizatriptan medication more than 6 times per month. Try not to use Excedrin more than 3 days per week    Recurrent Migraine Headache  Migraines are a type of headache, and they are usually stronger and more sudden than normal headaches (tension headaches). Migraines are characterized by an intense pulsing, throbbing pain that is usually only present on one side of the head. Sometimes, migraine headaches can cause nausea, vomiting, sensitivity to light and sound, and vision changes. Recurrent migraines keep coming back (recurring). A migraine can last from 4 hours up to 3 days. What are the causes? The exact cause of this condition is not known. However, a migraine may be caused when nerves in the brain become irritated and release chemicals that cause inflammation of blood vessels. This inflammation causes pain. Certain things may also trigger migraines, such as:  A disruption in your regular eating and sleeping schedule.  Smoking.  Stress.  Menstruation.  Certain foods and drinks, such as: ? Aged cheese. ? Chocolate. ? Alcohol. ? Caffeine. ? Foods or drinks that contain nitrates, glutamate, aspartame, MSG, or tyramine.  Lack of sleep.  Hunger.  Physical exertion.  Fatigue.  High altitude.  Weather changes.  Medicines, such as: ? Nitroglycerin, which is used to treat chest pain. ? Birth control  pills. ? Estrogen. ? Some blood pressure medicines. What are the signs or symptoms? Symptoms of this condition vary for each person and may include:  Pain that is usually only present on one side of the head. In some cases, the pain may be on both sides of the head or around the head or neck.  Pulsating or throbbing pain.  Severe pain that prevents daily activities.  Pain that is aggravated by any physical activity.  Nausea, vomiting, or both.  Dizziness.  Pain with exposure to bright lights, loud noises, or activity.  General sensitivity to bright lights, loud noises, or smells. Before you get a migraine, you may get warning signs that a migraine is coming (aura). An aura may include:  Seeing flashing lights.  Seeing bright spots, halos, or zigzag lines.  Having tunnel vision or blurred vision.  Having numbness or a tingling feeling.  Having trouble talking.  Having muscle weakness.  Smelling a certain odor. How is this diagnosed? This condition is often diagnosed based on:  Your symptoms and medical history.  A physical exam. You may also have tests, including:  A CT scan or MRI of your brain. These imaging tests cannot diagnose migraines, but they can help to rule out other causes of headaches.  Blood tests. How is this treated? This condition is treated with:  Medicines. These are used for: ? Lessening pain and nausea. ? Preventing recurrent migraines.  Lifestyle changes, such as changes to your diet or sleeping patterns.  Behavior therapy, such as relaxation training or biofeedback. Biofeedback is a treatment that involves teaching you to relax and use your brain to lower your heart rate and control your breathing. Follow  these instructions at home: Medicines  Take over-the-counter and prescription medicines only as told by your health care provider.  Do not drive or use heavy machinery while taking prescription pain medicine. Lifestyle  Do not  use any products that contain nicotine or tobacco, such as cigarettes and e-cigarettes. If you need help quitting, ask your health care provider.  Limit alcohol intake to no more than 1 drink a day for nonpregnant women and 2 drinks a day for men. One drink equals 12 oz of beer, 5 oz of wine, or 1 oz of hard liquor.  Get 7-9 hours of sleep each night, or the amount of sleep recommended by your health care provider.  Limit your stress. Talk with your health care provider if you need help with stress management.  Maintain a healthy weight. If you need help losing weight, ask your health care provider.  Exercise regularly. Aim for 150 minutes of moderate-intensity exercise (walking, biking, yoga) or 75 minutes of vigorous exercise (running, circuit training, swimming) each week. General instructions   Keep a journal to find out what triggers your migraine headaches so you can avoid these triggers. For example, write down: ? What you eat and drink. ? How much sleep you get. ? Any change to your diet or medicines.  Lie down in a dark, quiet room when you have a migraine.  Try placing a cool towel over your head when you have a migraine.  Keep lights dim, if bright lights bother you and make your migraines worse.  Keep all follow-up visits as told by your health care provider. This is important. Contact a health care provider if:  Your pain does not improve, even with medicine.  Your migraines continue to return, even with medicine.  You have a fever.  You have weight loss. Get help right away if:  Your migraine becomes severe and medicine does not help.  You have a stiff neck.  You have a loss of vision.  You have muscle weakness or loss of muscle control.  You start losing your balance or have trouble walking.  You feel faint or you pass out.  You develop new, severe symptoms.  You start having abrupt severe headaches that last for a second or less, like a  thunderclap. Summary  Migraine headaches are usually stronger and more sudden than normal headaches (tension headaches). Migraines are characterized by an intense pulsing, throbbing pain that is usually only present on one side of the head.  The exact cause of this condition is not known. However, a migraine may be caused when nerves in the brain become irritated and release chemicals that cause inflammation of blood vessels.  Certain things may trigger migraines, such as changes to diet or sleeping patterns, smoking, certain foods, alcohol, stress, and certain medicines.  Sometimes, migraine headaches can cause nausea, vomiting, sensitivity to light and sound, and vision changes.  Migraines are often diagnosed based on your symptoms, medical history, and a physical exam. This information is not intended to replace advice given to you by your health care provider. Make sure you discuss any questions you have with your health care provider. Document Released: 01/29/2001 Document Revised: 02/16/2016 Document Reviewed: 02/16/2016 Elsevier Interactive Patient Education  2019 [employer].

## 2018-09-19 ENCOUNTER — Other Ambulatory Visit: Payer: Self-pay | Admitting: Physician Assistant

## 2018-09-19 DIAGNOSIS — G43901 Migraine, unspecified, not intractable, with status migrainosus: Secondary | ICD-10-CM

## 2018-09-19 DIAGNOSIS — G43009 Migraine without aura, not intractable, without status migrainosus: Secondary | ICD-10-CM

## 2018-10-30 ENCOUNTER — Encounter: Payer: Self-pay | Admitting: Physician Assistant

## 2018-10-30 ENCOUNTER — Other Ambulatory Visit: Payer: Self-pay | Admitting: Physician Assistant

## 2018-10-30 DIAGNOSIS — G43009 Migraine without aura, not intractable, without status migrainosus: Secondary | ICD-10-CM

## 2018-10-30 NOTE — Telephone Encounter (Signed)
Last filled 09/21/2018 #9 with one refill.  Please advise on refill.

## 2018-11-02 ENCOUNTER — Encounter: Payer: Self-pay | Admitting: Physician Assistant

## 2018-11-02 ENCOUNTER — Ambulatory Visit (INDEPENDENT_AMBULATORY_CARE_PROVIDER_SITE_OTHER): Payer: Self-pay | Admitting: Physician Assistant

## 2018-11-02 DIAGNOSIS — G43829 Menstrual migraine, not intractable, without status migrainosus: Secondary | ICD-10-CM

## 2018-11-02 DIAGNOSIS — G43009 Migraine without aura, not intractable, without status migrainosus: Secondary | ICD-10-CM

## 2018-11-02 MED ORDER — NAPROXEN 500 MG PO TABS: 500.0000 mg | ORAL_TABLET | Freq: Once | ORAL | 1 refills | Status: DC | PRN

## 2018-11-02 MED ORDER — ELETRIPTAN HYDROBROMIDE 40 MG PO TABS: 40.0000 mg | ORAL_TABLET | ORAL | 2 refills | Status: DC | PRN

## 2018-11-02 MED ORDER — PROPRANOLOL HCL ER 80 MG PO CP24: 80.0000 mg | ORAL_CAPSULE | Freq: Every day | ORAL | 2 refills | Status: DC

## 2018-11-02 MED ORDER — BACLOFEN 10 MG PO TABS: 10.0000 mg | ORAL_TABLET | Freq: Two times a day (BID) | ORAL | 1 refills | Status: DC | PRN

## 2018-11-02 NOTE — Progress Notes (Signed)
Virtual Visit via Telephone Note  I connected with Ellen Short on 11/02/18 at  4:20 PM EDT by telephone and verified that I am speaking with the correct person using two identifiers.   I discussed the limitations, risks, security and privacy concerns of performing an evaluation and management service by telephone and the availability of in person appointments. I also discussed with the patient that there may be a patient responsible charge related to this service. The patient expressed understanding and agreed to proceed.   History of Present Illness: HPI:                                                                Ellen Short is a 38 y.o. female   CC: migraine follow-up  She is having 2-3 headache days per week She tapered off of Topamax a couple weeks ago due to cognitive side effects Rizatriptan was working for [employer] and is now no longer working for her even when she takes both doses 2 hours apart Triggers: ovulation/menses, emotional stress Prior abortive medications: Diclofenac, Midrin, Imitrex, Fioricet, Excedrin   Past Medical History:  Diagnosis Date  . Complication of anesthesia    ITCHING  . H/O varicella   . Infection    UTI WITH PREGNANCY  . Migraine    MIGRAINES  . Yeast infection    Past Surgical History:  Procedure Laterality Date  . CESAREAN SECTION     c/s times 4   . CESAREAN SECTION N/A 04/12/2015   Procedure: CESAREAN SECTION;  Surgeon: Donnamae Jude, MD;  Location: [facility];  Service: Obstetrics;  Laterality: N/A;  . INTRAUTERINE DEVICE INSERTION     [place]  . TONSILLECTOMY  AGE 15  . WISDOM TOOTH EXTRACTION  AGE 20   Social History   Tobacco Use  . Smoking status: Never Smoker  . Smokeless tobacco: Never Used  Substance Use Topics  . Alcohol use: Never    Frequency: Never   family history includes Birth defects in her son; Miscarriages / Stillbirths in her maternal grandmother; Parkinsonism in her maternal grandmother; Rheum  arthritis in her father.    ROS: negative except as noted in the HPI  Medications: Current Outpatient Medications  Medication Sig Dispense Refill  . citalopram (CELEXA) 10 MG tablet Take 2 tablets (20 mg total) by mouth at bedtime. 180 tablet 0  . docusate sodium (COLACE) 100 MG capsule Take 1 capsule (100 mg total) by mouth 2 (two) times daily. 90 capsule 3  . MULTIPLE VITAMINS-MINERALS PO Take by mouth.    . promethazine (PHENERGAN) 12.5 MG tablet TAKE 1 TO 2 TABLETS BY MOUTH EVERY 8 HOURS AS NEEDED FOR NAUSEA OR VOMITING 20 tablet 1  . barrier cream (NON-SPECIFIED) CREA Apply 1 application topically 2 (two) times daily as needed. (Patient not taking: Reported on 11/02/2018) 1 each 1  . clobetasol cream (TEMOVATE) 9.81 % Apply 1 application topically 2 (two) times daily. (Patient not taking: Reported on 11/02/2018) 15 g 3  . clotrimazole-betamethasone (LOTRISONE) cream Apply 1 application topically 2 (two) times daily. (Patient not taking: Reported on 11/02/2018) 30 g 0  . hydrocortisone (ANUSOL-HC) 2.5 % rectal cream Place 1 application rectally 2 (two) times daily as needed for hemorrhoids or anal itching. 28.35 g 2  .  rizatriptan (MAXALT) 10 MG tablet TAKE 1 TABLET BY MOUTH AS NEEDED FOR MIGRAINES. MAY REPEAT IN 2 HOURS IF NEEDED (Patient not taking: Reported on 11/02/2018) 9 tablet 1  . topiramate (TOPAMAX) 25 MG tablet Take 1 tablet (25 mg total) by mouth 2 (two) times daily. (Patient not taking: Reported on 11/02/2018) 180 tablet 0  . traMADol (ULTRAM) 50 MG tablet TAKE 1 OR 2 TABLETS BY MOUTH DAILY AS NEEDED FOR PAIN (Patient not taking: Reported on 11/02/2018) 60 tablet 0   No current facility-administered medications for this visit.    Allergies  Allergen Reactions  . Sulfa Antibiotics Swelling and Rash  . Amoxicillin Hives and Itching  . Erythromycin Hives and Itching  . Penicillins Hives and Nausea And Vomiting    Has patient had a PCN reaction causing immediate rash,  facial/tongue/throat swelling, SOB or lightheadedness with hypotension: No Has patient had a PCN reaction causing severe rash involving mucus membranes or skin necrosis: No Has patient had a PCN reaction that required hospitalization No Has patient had a PCN reaction occurring within the last 10 years: No If all of the above answers are "NO", then may proceed with Cephalosporin use.        Objective:  There were no vitals taken for this visit. Neuro: alert and oriented x 3 Psych: cooperative, euthymic mood, affect mood-congruent, speech is articulate, normal rate and volume; thought processes clear and goal-directed, normal judgment, good insight  BP Readings from Last 3 Encounters:  06/30/18 105/73  06/12/18 139/87  03/09/18 103/70   Pulse Readings from Last 3 Encounters:  06/30/18 87  06/12/18 97  03/09/18 80     No results found for this or any previous visit (from the past 72 hour(s)). No results found.    Assessment and Plan: 38 y.o. female with   .[name] was seen today for medication management.  Diagnoses and all orders for this visit:  Migraine without aura and without status migrainosus, not intractable -     propranolol ER (INDERAL LA) 80 MG 24 hr capsule; Take 1 capsule (80 mg total) by mouth at bedtime. -     baclofen (LIORESAL) 10 MG tablet; Take 1 tablet (10 mg total) by mouth 2 (two) times daily as needed (headache/migraine). -     naproxen (NAPROSYN) 500 MG tablet; Take 1 tablet (500 mg total) by mouth once as needed for up to 1 dose for headache. -     eletriptan (RELPAX) 40 MG tablet; Take 1 tablet (40 mg total) by mouth as needed for migraine. May repeat in 2 hours if headache persists or recurs.  Menstrual migraine without status migrainosus, not intractable   Self discontinued Topamax due to cognitive side effects Starting propranolol extended release 80 mg nightly for prevention Patient has failed multiple abortive medications, most recently  Maxalt Instructed to take baclofen and naproxen at the first sign of mild headache Take Eletriptan 40 mg as needed for moderate to severe headache  Counseled on general headache measures  Follow-up in 2 months   Follow Up Instructions:    I discussed the assessment and treatment plan with the patient. The patient was provided an opportunity to ask questions and all were answered. The patient agreed with the plan and demonstrated an understanding of the instructions.   The patient was advised to call back or seek an in-person evaluation if the symptoms worsen or if the condition fails to improve as anticipated.  I provided 11-20 minutes of non-face-to-face time during  this encounter.   Carlis Stableharley Elizabeth , [place]

## 2018-11-28 ENCOUNTER — Other Ambulatory Visit: Payer: Self-pay | Admitting: Physician Assistant

## 2018-11-28 DIAGNOSIS — F3281 Premenstrual dysphoric disorder: Secondary | ICD-10-CM

## 2019-01-16 ENCOUNTER — Encounter: Payer: Self-pay | Admitting: Physician Assistant

## 2019-01-19 ENCOUNTER — Encounter: Payer: Self-pay | Admitting: Physician Assistant

## 2019-01-19 ENCOUNTER — Ambulatory Visit (INDEPENDENT_AMBULATORY_CARE_PROVIDER_SITE_OTHER): Payer: Self-pay | Admitting: Physician Assistant

## 2019-01-19 VITALS — Wt 200.0 lb

## 2019-01-19 DIAGNOSIS — F410 Panic disorder [episodic paroxysmal anxiety] without agoraphobia: Secondary | ICD-10-CM

## 2019-01-19 DIAGNOSIS — F3281 Premenstrual dysphoric disorder: Secondary | ICD-10-CM

## 2019-01-19 DIAGNOSIS — Z630 Problems in relationship with spouse or partner: Secondary | ICD-10-CM

## 2019-01-19 DIAGNOSIS — G43009 Migraine without aura, not intractable, without status migrainosus: Secondary | ICD-10-CM

## 2019-01-19 MED ORDER — CLONAZEPAM 0.5 MG PO TABS: 0.5000 mg | ORAL_TABLET | Freq: Two times a day (BID) | ORAL | 0 refills | Status: DC | PRN

## 2019-01-19 MED ORDER — CITALOPRAM HYDROBROMIDE 20 MG PO TABS: 40.0000 mg | ORAL_TABLET | Freq: Every day | ORAL | 2 refills | Status: DC

## 2019-01-19 MED ORDER — ELETRIPTAN HYDROBROMIDE 40 MG PO TABS: 40.0000 mg | ORAL_TABLET | ORAL | 2 refills | Status: DC | PRN

## 2019-01-19 NOTE — Progress Notes (Signed)
Virtual Visit via Video Note  I connected with Ellen Short on 01/19/19 at  8:30 AM EDT by a  video enabled telemedicine application and verified that I am speaking with the correct person using two identifiers.   I discussed the limitations, risks, security and privacy concerns of performing an evaluation and management service by telephone and the availability of in person appointments. I also discussed with the patient that there may be a patient responsible charge related to this service. The patient expressed understanding and agreed to proceed.   History of Present Illness: HPI:                                                                Ellen Short is a 38 y.o. female   CC: migraine follow-up  She did not start Propranolol and opted to start Feverfew for prevention She states Eletriptan is effective She states Baclofen and Naproxen are not effective She is having approx 1 headache days per week She has noted crying is a huge trigger for migrianes along with emotional stress Prior abortive medications: Maxalt, Diclofenac, Midrin, Imitrex, Fioricet, Excedrin  Reports she is experiencing marital stress due to husband's infidelity Reports she is crying a lot and having severe panic attacks almost daily - numbness, SOB, vomiting She is having severe sleep difficulties She has lost motivation to do her usual activities at the house and reports it takes most of her energy to get out of bed and compose herself in front of her children Denies mania/hypomania Denies alcohol/drug use Denies SI/HI Symptoms became so severe that she sought care at [facility] ED on 8/28. She was treated with single dose of PO Ativan and instructed to f/u with PCP  She has been taking Celexa 20 mg daily for some time She is now meeting with a counselor at [place]  She lives at home with her 5 children    Depression screen [facility] 2/9 01/19/2019 06/12/2018 12/03/2017 12/23/2014  11/25/2014  Decreased Interest 2 0 0 0 0  Down, Depressed, Hopeless 3 1 0 0 0  PHQ - 2 Score 5 1 0 0 0  Altered sleeping 3 1 3  - -  Tired, decreased energy 3 1 2  - -  Change in appetite 3 0 0 - -  Feeling bad or failure about yourself  2 1 0 - -  Trouble concentrating 3 1 2  - -  Moving slowly or fidgety/restless 0 0 0 - -  Suicidal thoughts 0 0 0 - -  PHQ-9 Score 19 5 7  - -    GAD 7 : Generalized Anxiety Score 01/19/2019 06/12/2018 12/03/2017  Nervous, Anxious, on Edge 3 1 1   Control/stop worrying 3 1 0  Worry too much - different things 3 1 1   Trouble relaxing 3 1 1   Restless 0 0 0  Easily annoyed or irritable 0 0 1  Afraid - awful might happen 3 1 1   Total GAD 7 Score 15 5 5      Past Medical History:  Diagnosis Date  . Complication of anesthesia    ITCHING  . H/O varicella   . Infection    UTI WITH PREGNANCY  . Migraine    MIGRAINES  . Yeast infection    Past Surgical  History:  Procedure Laterality Date  . CESAREAN SECTION     c/s times 4   . CESAREAN SECTION N/A 04/12/2015   Procedure: CESAREAN SECTION;  Surgeon: Reva Bores, MD;  Location: [facility];  Service: Obstetrics;  Laterality: N/A;  . INTRAUTERINE DEVICE INSERTION     [place]  . TONSILLECTOMY  AGE 30  . WISDOM TOOTH EXTRACTION  AGE 61   Social History   Tobacco Use  . Smoking status: Never Smoker  . Smokeless tobacco: Never Used  Substance Use Topics  . Alcohol use: Never    Frequency: Never   family history includes Birth defects in her son; Miscarriages / Stillbirths in her maternal grandmother; Parkinsonism in her maternal grandmother; Rheum arthritis in her father.    ROS: negative except as noted in the HPI  Medications: Current Outpatient Medications  Medication Sig Dispense Refill  . baclofen (LIORESAL) 10 MG tablet Take 1 tablet (10 mg total) by mouth 2 (two) times daily as needed (headache/migraine). 30 each 1  . citalopram (CELEXA) 20 MG tablet Take 2 tablets (40 mg total) by mouth at  bedtime. 60 tablet 2  . eletriptan (RELPAX) 40 MG tablet Take 1 tablet (40 mg total) by mouth as needed for migraine. May repeat in 2 hours if headache persists or recurs. Max 80 mg/24 hr 10 tablet 2  . FEVERFEW EXTRACT PO Take by mouth.    . hydrocortisone (ANUSOL-HC) 2.5 % rectal cream Place 1 application rectally 2 (two) times daily as needed for hemorrhoids or anal itching. 28.35 g 2  . MULTIPLE VITAMINS-MINERALS PO Take by mouth.    . naproxen (NAPROSYN) 500 MG tablet Take 1 tablet (500 mg total) by mouth once as needed for up to 1 dose for headache. 30 tablet 1  . promethazine (PHENERGAN) 12.5 MG tablet TAKE 1 TO 2 TABLETS BY MOUTH EVERY 8 HOURS AS NEEDED FOR NAUSEA OR VOMITING 20 tablet 1  . clonazePAM (KLONOPIN) 0.5 MG tablet Take 1 tablet (0.5 mg total) by mouth 2 (two) times daily as needed for anxiety (panic attack). 20 tablet 0   No current facility-administered medications for this visit.    Allergies  Allergen Reactions  . Sulfa Antibiotics Swelling and Rash  . Amoxicillin Hives and Itching  . Erythromycin Hives and Itching  . Penicillins Hives and Nausea And Vomiting    Has patient had a PCN reaction causing immediate rash, facial/tongue/throat swelling, SOB or lightheadedness with hypotension: No Has patient had a PCN reaction causing severe rash involving mucus membranes or skin necrosis: No Has patient had a PCN reaction that required hospitalization No Has patient had a PCN reaction occurring within the last 10 years: No If all of the above answers are "NO", then may proceed with Cephalosporin use.   . Topamax [Topiramate] Other (See Comments)    Cognitive side effects       Objective:  Wt 200 lb (90.7 kg)   BMI 36.58 kg/m   Wt Readings from Last 3 Encounters:  01/19/19 200 lb (90.7 kg)  06/30/18 205 lb (93 kg)  06/12/18 205 lb (93 kg)   Temp Readings from Last 3 Encounters:  03/09/18 98.4 F (36.9 C) (Oral)  04/15/15 98 F (36.7 C) (Oral)  04/11/15 99  F (37.2 C) (Oral)   BP Readings from Last 3 Encounters:  06/30/18 105/73  06/12/18 139/87  03/09/18 103/70   Pulse Readings from Last 3 Encounters:  06/30/18 87  06/12/18 97  03/09/18 80  Neuro: alert and oriented x 3 Psych: cooperative, depressed mood, affect mood-congruent, she is tearful, speech is articulate, normal rate and volume; thought processes clear and goal-directed, normal judgment, good insight; no SI     Assessment and Plan: 38 y.o. female with   .Ellen DikeJennifer was seen today for medication management.  Diagnoses and all orders for this visit:  Panic disorder -     citalopram (CELEXA) 20 MG tablet; Take 2 tablets (40 mg total) by mouth at bedtime. -     clonazePAM (KLONOPIN) 0.5 MG tablet; Take 1 tablet (0.5 mg total) by mouth 2 (two) times daily as needed for anxiety (panic attack).  Stress due to marital problems  PMDD (premenstrual dysphoric disorder) -     citalopram (CELEXA) 20 MG tablet; Take 2 tablets (40 mg total) by mouth at bedtime.  Migraine without aura and without status migrainosus, not intractable -     eletriptan (RELPAX) 40 MG tablet; Take 1 tablet (40 mg total) by mouth as needed for migraine. May repeat in 2 hours if headache persists or recurs. Max 80 mg/24 hr   Panic disorder, Marital stress   [facility]  PHQ-9 Total Score  19    No acute safety issues Increase Celexa to 40 mg QHS; counseled patient to reduce to 1.5 tab (30 mg) if she does not tolerate doubling her dose Clonazepam 0.5 mg prn panic; counseled on risks of dependence and to limit use Keep f/u with church counselor List of resources sent via MyCHart  Migraines Discussed option of adding Magnesium 500 mg and Riboflavin to Feverfew  or switching to [employer]ig[employer]ief. Patient prefers supplements as alternative to prescription preventive medication We discussed how to use Baclofen for "green headaches" - dose  medication three times daily to prevent progression of headache with Naproxen twice a day Cont Eletriptan prn for "yellow/red headaches". Limit use to 3 days per week  Follow-up in 2 weeks  Follow Up Instructions:    I discussed the assessment and treatment plan with the patient. The patient was provided an opportunity to ask questions and all were answered. The patient agreed with the plan and demonstrated an understanding of the instructions.   The patient was advised to call back or seek an in-person evaluation if the symptoms worsen or if the condition fails to improve as anticipated.  I provided 20 minutes of non-face-to-face time during this encounter.   Carlis Stableharley Elizabeth Cummings, [place]PA-C

## 2019-02-02 ENCOUNTER — Ambulatory Visit: Payer: Self-pay | Admitting: Physician Assistant

## 2019-03-22 ENCOUNTER — Ambulatory Visit (INDEPENDENT_AMBULATORY_CARE_PROVIDER_SITE_OTHER): Payer: Self-pay | Admitting: Physician Assistant

## 2019-03-22 ENCOUNTER — Other Ambulatory Visit: Payer: Self-pay | Admitting: *Deleted

## 2019-03-22 DIAGNOSIS — G43009 Migraine without aura, not intractable, without status migrainosus: Secondary | ICD-10-CM

## 2019-03-22 DIAGNOSIS — Z5329 Procedure and treatment not carried out because of patient's decision for other reasons: Secondary | ICD-10-CM

## 2019-03-22 MED ORDER — ELETRIPTAN HYDROBROMIDE 40 MG PO TABS: 40.0000 mg | ORAL_TABLET | ORAL | 6 refills | Status: DC | PRN

## 2019-03-22 NOTE — Progress Notes (Signed)
No show

## 2019-05-05 ENCOUNTER — Ambulatory Visit: Payer: Self-pay | Admitting: Orthopedic Surgery

## 2019-05-07 ENCOUNTER — Ambulatory Visit (INDEPENDENT_AMBULATORY_CARE_PROVIDER_SITE_OTHER): Payer: Self-pay

## 2019-05-07 ENCOUNTER — Other Ambulatory Visit: Payer: Self-pay

## 2019-05-07 ENCOUNTER — Ambulatory Visit: Payer: Self-pay | Admitting: Orthopedic Surgery

## 2019-05-07 ENCOUNTER — Encounter: Payer: Self-pay | Admitting: Orthopedic Surgery

## 2019-05-07 VITALS — Ht 62.0 in | Wt 200.0 lb

## 2019-05-07 DIAGNOSIS — M25511 Pain in right shoulder: Secondary | ICD-10-CM

## 2019-05-07 DIAGNOSIS — M19011 Primary osteoarthritis, right shoulder: Secondary | ICD-10-CM

## 2019-05-07 MED ORDER — BUPIVACAINE HCL 0.25 % IJ SOLN
0.6600 mL | INTRAMUSCULAR | Status: AC | PRN
  Administered 2019-05-07: 20:00:00 .66 mL via INTRA_ARTICULAR

## 2019-05-07 MED ORDER — METHYLPREDNISOLONE ACETATE 40 MG/ML IJ SUSP
13.3300 mg | INTRAMUSCULAR | Status: AC | PRN
  Administered 2019-05-07: 13.33 mg via INTRA_ARTICULAR

## 2019-05-07 MED ORDER — LIDOCAINE HCL 1 % IJ SOLN
3.0000 mL | INTRAMUSCULAR | Status: AC | PRN
  Administered 2019-05-07: 3 mL

## 2019-05-07 NOTE — Progress Notes (Signed)
Office Visit Note   Patient: Ellen Short           Date of Birth: 03/13/1981           MRN: [id number] Visit Date: 05/07/2019 Requested by: Ellen Short, [address] [place],  [place] [address] PCP: Ellen Short, [place]  Subjective: Chief Complaint  Patient presents with  . Right Shoulder - Follow-up    HPI: Ellen Short is a patient with recurrent right shoulder pain.  She was last seen 03/08/2018.  Was having some calcific tendinitis at that time.  Is having some recurrent pain which she localizes anterior and superior in the shoulder joint.  She had ultrasound-guided debridement and aspiration of the calcific tendinitis which actually helped.  She works as a Geophysicist/field seismologist.  Father has history of rheumatoid arthritis.  Denies any neck pain or radicular symptoms.  Pain is been going on for about a month.              ROS: All systems reviewed are negative as they relate to the chief complaint within the history of present illness.  Patient denies  fevers or chills.   Assessment & Plan: Visit Diagnoses:  1. Acute pain of right shoulder   2. Arthritis of right acromioclavicular joint     Plan: Impression is recurrent right shoulder pain but no evidence of calcific tendinitis.  She does have tenderness to palpation around the [facility] joint.  Rotator cuff strength is good and no evidence of frozen shoulder.  No real clear-cut historical or clinical exam evidence of radiculopathy.  Plan is ultrasound-guided AC joint injection.  Come back in 6 weeks if she is not any better and we could consider further imaging.  I think taking anti-inflammatories would also be helpful.  Follow-Up Instructions: No follow-ups on file.   Orders:  Orders Placed This Encounter  Procedures  . XR Shoulder Right   No orders of the defined types were placed in this encounter.     Procedures: Medium Joint Inj: R acromioclavicular on 05/07/2019 8:02  PM Indications: diagnostic evaluation and pain Details: 25 G 1.5 in needle, ultrasound-guided superior approach Medications: 3 mL lidocaine 1 %; 0.66 mL bupivacaine 0.25 %; 13.33 mg methylPREDNISolone acetate 40 MG/ML Outcome: tolerated well, no immediate complications Procedure, treatment alternatives, risks and benefits explained, specific risks discussed. Consent was given by the patient. Immediately prior to procedure a time out was called to verify the correct patient, procedure, equipment, support staff and site/side marked as required. Patient was prepped and draped in the usual sterile fashion.       Clinical Data: No additional findings.  Objective: Vital Signs: Ht 5\' 2"  (1.575 m)   Wt 200 lb (90.7 kg)   BMI 36.58 kg/m   Physical Exam:   Constitutional: Patient appears well-developed HEENT:  Head: Normocephalic Eyes:EOM are normal Neck: Normal range of motion Cardiovascular: Normal rate Pulmonary/chest: Effort normal Neurologic: Patient is alert Skin: Skin is warm Psychiatric: Patient has normal mood and affect    Ortho Exam: Ortho exam demonstrates good cervical spine range of motion.  5 out of 5 grip EPL FPL interosseous wrist flexion extension bicep triceps and deltoid strength.  Patient has no restriction of external rotation of 15 degrees of abduction right versus left.  Does have tenderness to palpation right AC joint compared to the left.  No coarse grinding or crepitus present with internal/external rotation of the right arm.  Cervical spine range of motion  is full.  Patient has no paresthesias C5-T1.  Negative apprehension relocation testing.  Specialty Comments:  No specialty comments available.  Imaging: XR Shoulder Right  Result Date: 05/07/2019 AP outlet axillary right shoulder reviewed.  Mild narrowing of the [facility] joint is noted.  No significant spurring.  Acromiohumeral distance is maintained.  No glenohumeral joint arthritis no calcific tendinitis  visualized.    PMFS History: Patient Active Problem List   Diagnosis Date Noted  . Stress due to marital problems 01/19/2019  . Panic disorder 01/19/2019  . Menstrual migraine without status migrainosus, not intractable 11/02/2018  . Migraine without aura and without status migrainosus, not intractable 06/18/2018  . Intertrigo 06/18/2018  . External hemorrhoid 03/09/2018  . PMDD (premenstrual dysphoric disorder) 12/03/2017  . Dermatitis of external ear 12/03/2017  . Calcific tendinitis of right shoulder 10/27/2017   Past Medical History:  Diagnosis Date  . Complication of anesthesia    ITCHING  . H/O varicella   . Infection    UTI WITH PREGNANCY  . Migraine    MIGRAINES  . Yeast infection     Family History  Problem Relation Age of Onset  . Rheum arthritis Father   . Birth defects Son        HAS ONLY ONE KIDNEY  . Miscarriages / Stillbirths Maternal Grandmother   . Parkinsonism Maternal Grandmother     Past Surgical History:  Procedure Laterality Date  . CESAREAN SECTION     c/s times 4   . CESAREAN SECTION N/A 04/12/2015   Procedure: CESAREAN SECTION;  Surgeon: Ellen Bores, MD;  Location: [facility];  Service: Obstetrics;  Laterality: N/A;  . INTRAUTERINE DEVICE INSERTION     [place]  . TONSILLECTOMY  AGE 31  . WISDOM TOOTH EXTRACTION  AGE 39   Social History   Occupational History  . Occupation: HOMEMAKER  Tobacco Use  . Smoking status: Never Smoker  . Smokeless tobacco: Never Used  Substance and Sexual Activity  . Alcohol use: Never  . Drug use: Never  . Sexual activity: Yes    Partners: Male    Birth control/protection: None, Surgical

## 2019-10-08 ENCOUNTER — Other Ambulatory Visit: Payer: Self-pay | Admitting: Physician Assistant

## 2019-10-08 DIAGNOSIS — F410 Panic disorder [episodic paroxysmal anxiety] without agoraphobia: Secondary | ICD-10-CM

## 2019-10-09 ENCOUNTER — Other Ambulatory Visit: Payer: Self-pay | Admitting: Physician Assistant

## 2019-10-09 DIAGNOSIS — F410 Panic disorder [episodic paroxysmal anxiety] without agoraphobia: Secondary | ICD-10-CM

## 2019-10-09 DIAGNOSIS — F3281 Premenstrual dysphoric disorder: Secondary | ICD-10-CM

## 2019-10-12 ENCOUNTER — Other Ambulatory Visit: Payer: Self-pay | Admitting: Physician Assistant

## 2019-10-12 DIAGNOSIS — F410 Panic disorder [episodic paroxysmal anxiety] without agoraphobia: Secondary | ICD-10-CM

## 2019-10-16 ENCOUNTER — Encounter: Payer: Self-pay | Admitting: Physician Assistant

## 2019-10-19 ENCOUNTER — Other Ambulatory Visit: Payer: Self-pay

## 2019-10-19 DIAGNOSIS — F3281 Premenstrual dysphoric disorder: Secondary | ICD-10-CM

## 2019-10-19 DIAGNOSIS — F410 Panic disorder [episodic paroxysmal anxiety] without agoraphobia: Secondary | ICD-10-CM

## 2019-10-19 MED ORDER — CITALOPRAM HYDROBROMIDE 20 MG PO TABS: 40.0000 mg | ORAL_TABLET | Freq: Every day | ORAL | 0 refills | Status: DC

## 2019-11-26 ENCOUNTER — Encounter: Payer: Self-pay | Admitting: Physician Assistant

## 2019-11-26 ENCOUNTER — Ambulatory Visit (INDEPENDENT_AMBULATORY_CARE_PROVIDER_SITE_OTHER): Payer: Self-pay | Admitting: Physician Assistant

## 2019-11-26 ENCOUNTER — Other Ambulatory Visit: Payer: Self-pay

## 2019-11-26 VITALS — BP 131/87 | HR 92 | Ht 62.0 in | Wt 210.0 lb

## 2019-11-26 DIAGNOSIS — L409 Psoriasis, unspecified: Secondary | ICD-10-CM

## 2019-11-26 DIAGNOSIS — F410 Panic disorder [episodic paroxysmal anxiety] without agoraphobia: Secondary | ICD-10-CM

## 2019-11-26 DIAGNOSIS — R41840 Attention and concentration deficit: Secondary | ICD-10-CM

## 2019-11-26 DIAGNOSIS — G43009 Migraine without aura, not intractable, without status migrainosus: Secondary | ICD-10-CM

## 2019-11-26 DIAGNOSIS — F3281 Premenstrual dysphoric disorder: Secondary | ICD-10-CM

## 2019-11-26 DIAGNOSIS — Z8659 Personal history of other mental and behavioral disorders: Secondary | ICD-10-CM

## 2019-11-26 DIAGNOSIS — Z8249 Family history of ischemic heart disease and other diseases of the circulatory system: Secondary | ICD-10-CM | POA: Insufficient documentation

## 2019-11-26 MED ORDER — CLONAZEPAM 0.5 MG PO TABS: 0.5000 mg | ORAL_TABLET | Freq: Two times a day (BID) | ORAL | 0 refills | Status: DC | PRN

## 2019-11-26 MED ORDER — BUPROPION HCL ER (SR) 100 MG PO TB12: 100.0000 mg | ORAL_TABLET | Freq: Two times a day (BID) | ORAL | 1 refills | Status: DC

## 2019-11-26 MED ORDER — CITALOPRAM HYDROBROMIDE 20 MG PO TABS: 40.0000 mg | ORAL_TABLET | Freq: Every day | ORAL | 0 refills | Status: DC

## 2019-11-26 MED ORDER — ELETRIPTAN HYDROBROMIDE 40 MG PO TABS: 40.0000 mg | ORAL_TABLET | ORAL | 6 refills | Status: DC | PRN

## 2019-11-26 MED ORDER — CLOBETASOL PROPIONATE 0.05 % EX CREA
1.0000 | TOPICAL_CREAM | Freq: Two times a day (BID) | CUTANEOUS | 2 refills | Status: DC
Start: 2019-11-26 — End: 2020-11-24

## 2019-11-26 MED ORDER — BACLOFEN 10 MG PO TABS: 10.0000 mg | ORAL_TABLET | Freq: Two times a day (BID) | ORAL | 1 refills | Status: DC | PRN

## 2019-11-26 NOTE — Progress Notes (Signed)
Subjective:    Patient ID: Ellen Short, female    DOB: 1981-01-30, 39 y.o.   MRN: [id number]  HPI Pt is a 39 yo female with migraines, panic disorder, PMDD who presents to the clinic to discuss some concerns.  Patient was previously a patient of Rosita Kea, PA-C who has since left the clinic.  This is my first time seeing her today.  Patient is self-pay which poses some medication option problems.  Her migraines are not adequately controlled.  She is still having to or 3 a week.  For the most part they can be rescued with her regimen of Relpax and baclofen.  She did not tolerate Topamax.  She needs refills but does not wish to make any changes to this regimen.  Her anxiety and panic are fairly well controlled.  She is going to marriage counseling and personal counseling.  She is trying to stay relatively active.  She has done really well on the Celexa.  She does occasionally use the Klonopin.  She is concerned and her counselors asked her to discuss her focus, impulsive, "scattered brain" feelings.  Per patient she was diagnosed when she was about 46 to 39 years old with ADHD.  She never received medication treatment.  Her mother wanted to do behavioral modification and counseling.  She has never tried any medication for this.  She feels like this is getting progressively worse and worse.  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  She feels like she does not finish any tasks.  She feels like it creates problems in her marriage because she cannot seem to hold all of the laundry or cleaning out the floor.  She finds herself tearful at times when she just cannot get things done.  Patient does have some itchy scaly lesions on her arms and legs.  She wonders if she could have some cream for this.  She has never had any official diagnosis.  Marland Kitchen. Active Ambulatory Problems    Diagnosis Date Noted  . Calcific tendinitis of right shoulder 10/27/2017  .  PMDD (premenstrual dysphoric disorder) 12/03/2017  . Dermatitis of external ear 12/03/2017  . External hemorrhoid 03/09/2018  . Migraine without aura and without status migrainosus, not intractable 06/18/2018  . Intertrigo 06/18/2018  . Menstrual migraine without status migrainosus, not intractable 11/02/2018  . Stress due to marital problems 01/19/2019  . Panic disorder 01/19/2019  . Family history of blood clots 11/26/2019  . History of attention deficit hyperactivity disorder (ADHD) 11/29/2019  . Inattention 11/29/2019  . Psoriasis 11/29/2019   Resolved Ambulatory Problems    Diagnosis Date Noted  . Supervision of normal pregnancy 08/29/2014  . AMA (advanced maternal age) multigravida 35+ 09/06/2014  . Previous cesarean delivery affecting pregnancy, antepartum 09/26/2014  . [redacted] weeks gestation of pregnancy   . Encounter for fetal anatomic survey   . Advanced maternal age in multigravida   . Recurrent UTI (urinary tract infection) complicating pregnancy 01/06/2015  . Back pain affecting pregnancy in third trimester 01/09/2015  . Term pregnancy delivered 04/12/2015   Past Medical History:  Diagnosis Date  . Complication of anesthesia   . H/O varicella   . Infection   . Migraine   . Yeast infection     Review of Systems  All other systems reviewed and are negative.      Objective:   Physical Exam Vitals reviewed.  Constitutional:      Appearance: Normal appearance.  HENT:     Head: Normocephalic.  Cardiovascular:     Rate and Rhythm: Normal rate and regular rhythm.     Pulses: Normal pulses.     Heart sounds: Normal heart sounds.  Skin:    Comments: Slightly raised scaly hyperkerotic patches of skin on extensor surfaces with some erythema in center.   Neurological:     General: No focal deficit present.     Mental Status: She is alert and oriented to person, place, and time.  Psychiatric:     Comments: Tearful     .Marland Kitchen Depression screen [facility] 2/9 11/26/2019  01/19/2019 06/12/2018 12/03/2017 12/23/2014  Decreased Interest 0 2 0 0 0  Down, Depressed, Hopeless 1 3 1  0 0  PHQ - 2 Score 1 5 1  0 0  Altered sleeping 1 3 1 3  -  Tired, decreased energy 1 3 1 2  -  Change in appetite 0 3 0 0 -  Feeling bad or failure about yourself  1 2 1  0 -  Trouble concentrating 3 3 1 2  -  Moving slowly or fidgety/restless 0 0 0 0 -  Suicidal thoughts 0 0 0 0 -  PHQ-9 Score 7 19 5 7  -  Difficult doing work/chores Very difficult - - - -   . GAD 7 : Generalized Anxiety Score 11/26/2019 01/19/2019 06/12/2018 12/03/2017  Nervous, Anxious, on Edge 0 3 1 1   Control/stop worrying 0 3 1 0  Worry too much - different things 0 3 1 1   Trouble relaxing 2 3 1 1   Restless 2 0 0 0  Easily annoyed or irritable 1 0 0 1  Afraid - awful might happen 0 3 1 1   Total GAD 7 Score 5 15 5 5   Anxiety Difficulty Very difficult - - -          Assessment & Plan:  . Devon was seen today for stress.  Diagnoses and all orders for this visit:  Inattention -     buPROPion (WELLBUTRIN SR) 100 MG 12 hr tablet; Take 1 tablet (100 mg total) by mouth 2 (two) times daily.  Family history of blood clots  PMDD (premenstrual dysphoric disorder) -     citalopram (CELEXA) 20 MG tablet; Take 2 tablets (40 mg total) by mouth at bedtime.  Panic disorder -     citalopram (CELEXA) 20 MG tablet; Take 2 tablets (40 mg total) by mouth at bedtime. -     clonazePAM (KLONOPIN) 0.5 MG tablet; Take 1 tablet (0.5 mg total) by mouth 2 (two) times daily as needed for anxiety (panic attack).  Migraine without aura and without status migrainosus, not intractable -     baclofen (LIORESAL) 10 MG tablet; Take 1 tablet (10 mg total) by mouth 2 (two) times daily as needed (headache/migraine). -     eletriptan (RELPAX) 40 MG tablet; Take 1 tablet (40 mg total) by mouth as needed for migraine. May repeat in 2 hours if headache persists or recurs. Max 80 mg/24 hr  History of attention deficit hyperactivity disorder  (ADHD) -     buPROPion (WELLBUTRIN SR) 100 MG 12 hr tablet; Take 1 tablet (100 mg total) by mouth 2 (two) times daily.  Psoriasis  PHQ-9 and GAD 7 numbers appear much better than previous testing.  This would infer that anxiety and depression are more controlled.  Patient is having increasing problems with ADHD-like symptoms.  She does report that she was diagnosed with this in her childhood but never adequately treated.  Her mother wanted her to do counseling and behavioral modification.  She has done that her whole life.  She is now seeing 2 counselors and they suggested that she reach out to her PCP for more help.  She is self-pay worries about the cost of official testing.  .. Adult ADHD Self Report Scale (most recent)    Adult ADHD Self-Report Scale (ASRS-v1.1) Symptom Checklist - 11/26/19 1500      Part A   1. How often do you have trouble wrapping up the final details of a project, once the challenging parts have been done? Very Often  2. How often do you have difficulty getting things done in order when you have to do a task that requires organization? Very Often    3. How often do you have problems remembering appointments or obligations? Often  4. When you have a task that requires a lot of thought, how often do you avoid or delay getting started? Very Often    5. How often do you fidget or squirm with your hands or feet when you have to sit down for a long time? Sometimes  6. How often do you feel overly active and compelled to do things, like you were driven by a motor? Sometimes      Part B   7. How often do you make careless mistakes when you have to work on a boring or difficult project? Often  8. How often do you have difficulty keeping your attention when you are doing boring or repetitive work? Very Often    9. How often do you have difficulty concentrating on what people say to you, even when they are speaking to you directly? Often  10. How often do you misplace or have  difficulty finding things at home or at work? Very Often    11. How often are you distracted by activity or noise around you? Very Often  12. How often do you leave your seat in meetings or other situations in which you are expected to remain seated? Sometimes    13. How often do you feel restless or fidgety? Very Often  14. How often do you have difficulty unwinding and relaxing when you have time to yourself? Very Often    15. How often do you find yourself talking too much when you are in social situations? Very Often  16. When you are in a conversation, how often do you find yourself finishing the sentences of the people you are talking to, before they can finish them themselves? Often    17. How often do you have difficulty waiting your turn in situations when turn taking is required? Sometimes  18. How often do you interrupt others when they are busy? Sometimes          Patient did screen very high for ADHD.  I would like for her to try Wellbutrin twice daily.  Follow-up in 6 weeks.  We will look into what the cost would be self-pay for official ADHD testing.  Migraines-controlled refills sent.    RASH looks like psoriasis. Topical steroid given to try.

## 2019-11-26 NOTE — Patient Instructions (Addendum)
Start Wellbutrin.   Attention Deficit Hyperactivity Disorder, Adult Attention deficit hyperactivity disorder (ADHD) is a mental health disorder that starts during childhood (neurodevelopmental disorder). For many people with ADHD, the disorder continues into the adult years. Treatment can help you manage your symptoms. What are the causes? The exact cause of ADHD is not known. Most experts believe genetics and environmental factors contribute to ADHD. What increases the risk? The following factors may make you more likely to develop this condition:  Having a family history of ADHD.  Being female.  Being born to a mother who smoked or drank alcohol during pregnancy.  Being exposed to lead or other toxins in the womb or early in life.  Being born before 37 weeks of pregnancy (prematurely) or at a low birth weight.  Having experienced a brain injury. What are the signs or symptoms? Symptoms of this condition depend on the type of ADHD. The two main types are inattentive and hyperactive-impulsive. Some people may have symptoms of both types. Symptoms of the inattentive type include:  Difficulty paying attention.  Making careless mistakes.  Not following instructions.  Being disorganized.  Avoiding tasks that require time and attention.  Losing and forgetting things.  Being easily distracted. Symptoms of the hyperactive-impulsive type include:  Restlessness.  Talking too much.  Interrupting.  Difficulty with: ? Sitting still. ? Feeling motivated. ? Relaxing. ? Waiting in line or waiting for a turn. In adults, this condition may lead to certain problems, such as:  Keeping jobs.  Performing tasks at work.  Having stable relationships.  Being on time or keeping to a schedule. How is this diagnosed? This condition is diagnosed based on your current symptoms and your history of symptoms. The diagnosis can be made by a health care provider such as a primary care  provider or a mental health care specialist. Your health care provider may use a symptom checklist or a behavior rating scale to evaluate your symptoms. He or she may also want to talk with people who have observed your behaviors throughout your life. How is this treated? This condition can be treated with medicines and behavior therapy. Medicines may be the best option to reduce impulsive behaviors and improve attention. Your health care provider may recommend:  Stimulant medicines. These are the most common medicines used for adult ADHD. They affect certain chemicals in the brain (neurotransmitters) and improve your ability to control your symptoms.  A non-stimulant medicine for adult ADHD (atomoxetine). This medicine increases a neurotransmitter called norepinephrine. It may take weeks to months to see effects from this medicine. Counseling and behavioral management are also important for treating ADHD. Counseling is often used along with medicine. Your health care provider may suggest:  Cognitive behavioral therapy (CBT). This type of therapy teaches you to replace negative thoughts and actions with positive thoughts and actions. When used as part of ADHD treatment, this therapy may also include: ? Coping strategies for organization, time management, impulse control, and stress reduction. ? Mindfulness and meditation training.  Behavioral management. You may work with a Psychologist, occupational who is specially trained to help people with ADHD manage and organize activities and function more effectively. Follow these instructions at home: Medicines   Take over-the-counter and prescription medicines only as told by your health care provider.  Talk with your health care provider about the possible side effects of your medicines and how to manage them. Lifestyle   Do not use drugs.  Do not drink alcohol if: ? Your health  care provider tells you not to drink. ? You are pregnant, may be pregnant, or are  planning to become pregnant.  If you drink alcohol: ? Limit how much you use to:  0-1 drink a day for women.  0-2 drinks a day for men. ? Be aware of how much alcohol is in your drink. In the [place] one drink equals one 12 oz bottle of beer (355 mL), one 5 oz glass of wine (148 mL), or one 1 oz glass of hard liquor (44 mL).  Get enough sleep.  Eat a healthy diet.  Exercise regularly. Exercise can help to reduce stress and anxiety. General instructions  Learn as much as you can about adult ADHD, and work closely with your health care providers to find the treatments that work best for you.  Follow the same schedule each day.  Use reminder devices like notes, calendars, and phone apps to stay on time and organized.  Keep all follow-up visits as told by your health care provider and therapist. This is important. Where to find more information A health care provider may be able to recommend resources that are available online or over the phone. You could start with:  Attention Deficit Disorder Association (ADDA): http://davis-dillon.net/  [employer] of Mental Health [facility]): http://www.maynard.net/ Contact a health care provider if:  Your symptoms continue to cause problems.  You have side effects from your medicine, such as: ? Repeated muscle twitches, coughing, or speech outbursts. ? Sleep problems. ? Loss of appetite. ? Dizziness. ? Unusually fast heartbeat. ? Stomach pains. ? Headaches.  You are struggling with anxiety, depression, or substance abuse. Get help right away if you:  Have a severe reaction to a medicine. If you ever feel like you may hurt yourself or others, or have thoughts about taking your own life, get help right away. You can go to the nearest emergency department or call:  Your local emergency services (911 in the [place]  A suicide crisis helpline, such as the National Suicide Prevention Lifeline at 347-735-8231. This is open 24 hours a day. Summary  ADHD  is a mental health disorder that starts during childhood (neurodevelopmental disorder) and often continues into the adult years.  The exact cause of ADHD is not known. Most experts believe genetics and environmental factors contribute to ADHD.  There is no cure for ADHD, but treatment with medicine, cognitive behavioral therapy, or behavioral management can help you manage your condition. This information is not intended to replace advice given to you by your health care provider. Make sure you discuss any questions you have with your health care provider. Document Revised: 09/28/2018 Document Reviewed: 09/28/2018 Elsevier Patient Education  2020 [employer].

## 2019-11-29 ENCOUNTER — Encounter: Payer: Self-pay | Admitting: Physician Assistant

## 2019-11-29 DIAGNOSIS — Z8659 Personal history of other mental and behavioral disorders: Secondary | ICD-10-CM | POA: Insufficient documentation

## 2019-11-29 DIAGNOSIS — L409 Psoriasis, unspecified: Secondary | ICD-10-CM | POA: Insufficient documentation

## 2019-11-29 DIAGNOSIS — R41840 Attention and concentration deficit: Secondary | ICD-10-CM | POA: Insufficient documentation

## 2019-11-29 NOTE — Progress Notes (Signed)
Patient can fill out one of the [facility] packets at the front desk and its based on income to say what you qualify for. Yes, [facility] in [place] for testing

## 2020-01-07 ENCOUNTER — Encounter: Payer: Self-pay | Admitting: Physician Assistant

## 2020-01-07 ENCOUNTER — Other Ambulatory Visit: Payer: Self-pay

## 2020-01-07 ENCOUNTER — Ambulatory Visit (INDEPENDENT_AMBULATORY_CARE_PROVIDER_SITE_OTHER): Payer: Self-pay | Admitting: Physician Assistant

## 2020-01-07 VITALS — BP 125/76 | HR 90 | Ht 62.0 in | Wt 209.0 lb

## 2020-01-07 DIAGNOSIS — F410 Panic disorder [episodic paroxysmal anxiety] without agoraphobia: Secondary | ICD-10-CM

## 2020-01-07 DIAGNOSIS — Z8659 Personal history of other mental and behavioral disorders: Secondary | ICD-10-CM

## 2020-01-07 DIAGNOSIS — N898 Other specified noninflammatory disorders of vagina: Secondary | ICD-10-CM

## 2020-01-07 DIAGNOSIS — R41840 Attention and concentration deficit: Secondary | ICD-10-CM

## 2020-01-07 DIAGNOSIS — F3281 Premenstrual dysphoric disorder: Secondary | ICD-10-CM

## 2020-01-07 MED ORDER — CITALOPRAM HYDROBROMIDE 20 MG PO TABS: 40.0000 mg | ORAL_TABLET | Freq: Every day | ORAL | 5 refills | Status: DC

## 2020-01-07 MED ORDER — AMPHETAMINE-DEXTROAMPHET ER 10 MG PO CP24: 10.0000 mg | ORAL_CAPSULE | Freq: Every day | ORAL | 0 refills | Status: DC

## 2020-01-07 NOTE — Progress Notes (Signed)
Subjective:    Patient ID: Ellen Short, female    DOB: December 28, 1980, 39 y.o.   MRN: [id number]  HPI  Pt is a 39 yo female with migraines, history of ADHD, panic disorder, PMDD who presents to the clinic with attention deficit symptoms. We tried wellbutrin but she said she noticed no benefit. She has not had formal testing. She continues celexa which helps anxiety and panic. She uses klonapin very rarely. She cannot seem to complete task.   She also has some intense vaginal itching right before her period. She wonders what to do. No discharge or pain.   .. Active Ambulatory Problems    Diagnosis Date Noted  . Calcific tendinitis of right shoulder 10/27/2017  . PMDD (premenstrual dysphoric disorder) 12/03/2017  . Dermatitis of external ear 12/03/2017  . External hemorrhoid 03/09/2018  . Migraine without aura and without status migrainosus, not intractable 06/18/2018  . Intertrigo 06/18/2018  . Menstrual migraine without status migrainosus, not intractable 11/02/2018  . Stress due to marital problems 01/19/2019  . Panic disorder 01/19/2019  . Family history of blood clots 11/26/2019  . History of attention deficit hyperactivity disorder (ADHD) 11/29/2019  . Inattention 11/29/2019  . Psoriasis 11/29/2019  . Vaginal itching 01/07/2020   Resolved Ambulatory Problems    Diagnosis Date Noted  . Supervision of normal pregnancy 08/29/2014  . AMA (advanced maternal age) multigravida 35+ 09/06/2014  . Previous cesarean delivery affecting pregnancy, antepartum 09/26/2014  . [redacted] weeks gestation of pregnancy   . Encounter for fetal anatomic survey   . Advanced maternal age in multigravida   . Recurrent UTI (urinary tract infection) complicating pregnancy 01/06/2015  . Back pain affecting pregnancy in third trimester 01/09/2015  . Term pregnancy delivered 04/12/2015   Past Medical History:  Diagnosis Date  . Complication of anesthesia   . H/O varicella   . Infection   . Migraine    . Yeast infection       Review of Systems  All other systems reviewed and are negative.      Objective:   Physical Exam Vitals reviewed.  Constitutional:      Appearance: Normal appearance. She is obese.  Cardiovascular:     Rate and Rhythm: Normal rate and regular rhythm.     Pulses: Normal pulses.     Heart sounds: Normal heart sounds.  Pulmonary:     Effort: Pulmonary effort is normal.  Neurological:     General: No focal deficit present.     Mental Status: She is alert and oriented to person, place, and time.  Psychiatric:        Mood and Affect: Mood normal.          .. Depression screen [facility] 2/9 11/26/2019 01/19/2019 06/12/2018 12/03/2017 12/23/2014  Decreased Interest 0 2 0 0 0  Down, Depressed, Hopeless 1 3 1  0 0  PHQ - 2 Score 1 5 1  0 0  Altered sleeping 1 3 1 3  -  Tired, decreased energy 1 3 1 2  -  Change in appetite 0 3 0 0 -  Feeling bad or failure about yourself  1 2 1  0 -  Trouble concentrating 3 3 1 2  -  Moving slowly or fidgety/restless 0 0 0 0 -  Suicidal thoughts 0 0 0 0 -  PHQ-9 Score 7 19 5 7  -  Difficult doing work/chores Very difficult - - - -   . GAD 7 : Generalized Anxiety Score 11/26/2019 01/19/2019 06/12/2018 12/03/2017  Nervous, Anxious,  on Edge 0 3 1 1   Control/stop worrying 0 3 1 0  Worry too much - different things 0 3 1 1   Trouble relaxing 2 3 1 1   Restless 2 0 0 0  Easily annoyed or irritable 1 0 0 1  Afraid - awful might happen 0 3 1 1   Total GAD 7 Score 5 15 5 5   Anxiety Difficulty Very difficult - - -     Assessment & Plan:   Diagnoses and all orders for this visit:  History of attention deficit hyperactivity disorder (ADHD) -     amphetamine-dextroamphetamine (ADDERALL XR) 10 MG 24 hr capsule; Take 1 capsule (10 mg total) by mouth daily. -     Ambulatory referral to Psychology  PMDD (premenstrual dysphoric disorder) -     citalopram (CELEXA) 20 MG tablet; Take 2 tablets (40 mg total) by mouth at bedtime. -     Ambulatory  referral to Psychology  Panic disorder -     citalopram (CELEXA) 20 MG tablet; Take 2 tablets (40 mg total) by mouth at bedtime. -     Ambulatory referral to Psychology  Inattention -     amphetamine-dextroamphetamine (ADDERALL XR) 10 MG 24 hr capsule; Take 1 capsule (10 mg total) by mouth daily. -     Ambulatory referral to Psychology  Vaginal itching    Will try stimulant after wellbutrin failure. Taper off wellbutrin. Started adderall. Discussed side effects. Will need formal testing. Will make referral. [facility]iscussed [facility] for discounted rate.  Encouraged good sleep hygiene and to watch for any worsening of anxiety or mood.  Follow up in 1 month with mychart message.  3 months in clinic.   PHQ and GAD stable. Refilled celexa.   Discussed vaginal itching could be hormonal or PH inbalance. Suggest coconut oil and also consider boric acid a few days before cycle. Follow up as needed.

## 2020-01-07 NOTE — Patient Instructions (Signed)
Amphetamine; Dextroamphetamine extended-release capsules What is this medicine? AMPHETAMINE; DEXTROAMPHETAMINE (am FET a meen; dex troe am FET a meen) is used to treat attention-deficit hyperactivity disorder (ADHD). Federal law prohibits giving this medicine to any person other than the person for whom it was prescribed. Do not share this medicine with anyone else. This medicine may be used for other purposes; ask your health care provider or pharmacist if you have questions. COMMON BRAND NAME(S): Adderall XR, Mydayis What should I tell my health care provider before I take this medicine? They need to know if you have any of these conditions:  anxiety or panic attacks  circulation problems in fingers and toes  glaucoma  hardening or blockages of the arteries or heart blood vessels  heart disease or a heart defect  high blood pressure  history of a drug or alcohol abuse problem  history of stroke  kidney disease  liver disease  mental illness  seizures  suicidal thoughts, plans, or attempt; a previous suicide attempt by you or a family member  thyroid disease  Tourette's syndrome  an unusual or allergic reaction to dextroamphetamine, other amphetamines, other medicines, foods, dyes, or preservatives  pregnant or trying to get pregnant  breast-feeding How should I use this medicine? Take this medicine by mouth with a glass of water. Follow the directions on the prescription label. This medicine is taken just one time per day, usually in the morning after waking up. Take with or without food. Do not chew or crush this medicine. You may open the capsules and sprinkle the medicine on a spoonful of applesauce. If sprinkled on applesauce, take the dose immediately and do not crush or chew. Do not take your medicine more often than directed. A special MedGuide will be given to you by the pharmacist with each prescription and refill. Be sure to read this information carefully  each time. Talk to your pediatrician regarding the use of this medicine in children. While this drug may be prescribed for children as young as 6 years for selected conditions, precautions do apply. Some extended-release capsules are recommended for use only in children 13 years of age and older. Overdosage: If you think you have taken too much of this medicine contact a poison control center or emergency room at once. NOTE: This medicine is only for you. Do not share this medicine with others. What if I miss a dose? If you miss a dose, take it as soon as you can in the morning, but do not take it later in the day because it can cause trouble sleeping. If it is almost time for your next dose, take only that dose. Do not take double or extra doses. What may interact with this medicine? Do not take this medicine with any of the following medications:  MAOIs like Carbex, Eldepryl, Marplan, Nardil, and Parnate  other stimulant medicines for attention disorders This medicine may also interact with the following medications:  acetazolamide  alcohol  ammonium chloride  antacids  ascorbic acid  atomoxetine  caffeine  certain medicines for blood pressure  certain medicines for depression, anxiety, or psychotic disturbances  certain medicines for seizures like carbamazepine, phenobarbital, phenytoin  certain medicines for stomach problems like cimetidine, ranitidine, famotidine, esomeprazole, omeprazole, lansoprazole, pantoprazole  lithium  medicines for colds and breathing difficulties  medicines for diabetes  medicines or dietary supplements for weight loss or to stay awake  methenamine  narcotic medicines for pain  quinidine  ritonavir  sodium bicarbonate  St.   John's wort This list may not describe all possible interactions. Give your health care provider a list of all the medicines, herbs, non-prescription drugs, or dietary supplements you use. Also tell them if you  smoke, drink alcohol, or use illegal drugs. Some items may interact with your medicine. What should I watch for while using this medicine? Visit your doctor or health care professional for regular checks on your progress. This prescription requires that you follow special procedures with your doctor and pharmacy. You will need to have a new written prescription from your doctor every time you need a refill. This medicine may affect your concentration, or hide signs of tiredness. Until you know how this medicine affects you, do not drive, ride a bicycle, use machinery, or do anything that needs mental alertness. Alcohol should be avoided with some brands of this medicine. Talk to your doctor or health care professional if you have questions. Tell your doctor or health care professional if this medicine loses its effects, or if you feel you need to take more than the prescribed amount. Do not change the dosage without talking to your doctor or health care professional. Decreased appetite is a common side effect when starting this medicine. Eating small, frequent meals or snacks can help. Talk to your doctor if you continue to have poor eating habits. Height and weight growth of a child taking this medicine will be monitored closely. Do not take this medicine close to bedtime. It may prevent you from sleeping. If you are going to need surgery, an MRI, a CT scan, or other procedure, tell your doctor that you are taking this medicine. You may need to stop taking this medicine before the procedure. Tell your doctor or healthcare professional right away if you notice unexplained wounds on your fingers and toes while taking this medicine. You should also tell your healthcare provider if you experience numbness or pain, changes in the skin color, or sensitivity to temperature in your fingers or toes. What side effects may I notice from receiving this medicine? Side effects that you should report to your doctor or  health care professional as soon as possible:  allergic reactions like skin rash, itching or hives, swelling of the face, lips, or tongue  anxious  breathing problems  changes in emotions or moods  changes in vision  chest pain or chest tightness  fast, irregular heartbeat  fingers or toes feel numb, cool, painful  hallucination, loss of contact with reality  high blood pressure  males: prolonged or painful erection  seizures  signs and symptoms of serotonin syndrome like confusion, increased sweating, fever, tremor, stiff muscles, diarrhea  signs and symptoms of a stroke like changes in vision; confusion; trouble speaking or understanding; severe headaches; sudden numbness or weakness of the face, arm or leg; trouble walking; dizziness; loss of balance or coordination  suicidal thoughts or other mood changes  uncontrollable head, mouth, neck, arm, or leg movements Side effects that usually do not require medical attention (report to your doctor or health care professional if they continue or are bothersome):  dry mouth  headache  irritability  loss of appetite  nausea  trouble sleeping  weight loss This list may not describe all possible side effects. Call your doctor for medical advice about side effects. You may report side effects to FDA at 1-800-FDA-1088. Where should I keep my medicine? Keep out of the reach of children. This medicine can be abused. Keep your medicine in a safe place to   protect it from theft. Do not share this medicine with anyone. Selling or giving away this medicine is dangerous and against the law. Store at room temperature between 15 and 30 degrees C (59 and 86 degrees F). Keep container tightly closed. Protect from light. Throw away any unused medicine after the expiration date. NOTE: This sheet is a summary. It may not cover all possible information. If you have questions about this medicine, talk to your doctor, pharmacist, or health  care provider.  2020 Elsevier/Gold Standard (2016-06-30 13:37:27)  

## 2020-01-10 ENCOUNTER — Encounter: Payer: Self-pay | Admitting: Physician Assistant

## 2020-02-14 ENCOUNTER — Encounter: Payer: Self-pay | Admitting: Physician Assistant

## 2020-02-14 DIAGNOSIS — R41840 Attention and concentration deficit: Secondary | ICD-10-CM

## 2020-02-14 DIAGNOSIS — Z8659 Personal history of other mental and behavioral disorders: Secondary | ICD-10-CM

## 2020-02-15 NOTE — Telephone Encounter (Signed)
That's fine just keep patient in loop. I am willing to treat her until she can get tested but I do want her tested.

## 2020-02-15 NOTE — Telephone Encounter (Signed)
Lesly Rubenstein    I can send the referral as far as her being seen while waiting I am not sure how that works. I do however know I just had to redo a referral for ADHD testing due to the patient did not want to wait until March to be seen. They are booking into March right now for testing at [facility].   Arline Asp

## 2020-02-15 NOTE — Telephone Encounter (Signed)
I want her to get ADHD testing. She needed to get [facility]. She says she has submitted can we get her scheduled while we wait?

## 2020-02-17 NOTE — Telephone Encounter (Signed)
Ellen Short   I just checked on referral to Dr. Dellia Cloud to see if patient had been scheduled for testing and they have closed referral due to when they called patient to schedule she hung up on them after they stated who they were. The note is from [employer].  - CF

## 2020-02-21 NOTE — Telephone Encounter (Signed)
Does she need another referral or can she just call and make appt?

## 2020-02-25 MED ORDER — AMPHETAMINE-DEXTROAMPHET ER 10 MG PO CP24: 10.0000 mg | ORAL_CAPSULE | Freq: Every day | ORAL | 0 refills | Status: DC

## 2020-02-25 NOTE — Telephone Encounter (Signed)
Pt is in process of being tested. Will continue to refill until testing. Please send.   Ellen Short please have patient call with appt date and time so that we can have that information.

## 2020-02-25 NOTE — Telephone Encounter (Signed)
Patient called back to place referred and was told they don't test for ADHD. Please send to a place that will test patient.

## 2020-02-25 NOTE — Telephone Encounter (Signed)
Patient called and asked about continuing medication until she is seen and tested. Adderall RX pended. She has been out for a bit. Please sign if appropriate.

## 2020-02-27 NOTE — Telephone Encounter (Signed)
That was the number Ellen Short gave me to give her to call. Its dr gutterman/[facility] office and I know they test. So can we get her that number.

## 2020-02-28 NOTE — Telephone Encounter (Signed)
She was given the correct number the referral had Dr. Dellia Cloud on it and he does not test so I changed it to Dr. Reggy Eye and resent it so they should reach out to her.

## 2020-03-04 ENCOUNTER — Other Ambulatory Visit: Payer: Self-pay | Admitting: Physician Assistant

## 2020-03-04 DIAGNOSIS — G43901 Migraine, unspecified, not intractable, with status migrainosus: Secondary | ICD-10-CM

## 2020-03-10 ENCOUNTER — Other Ambulatory Visit: Payer: Self-pay | Admitting: Physician Assistant

## 2020-03-10 DIAGNOSIS — G43901 Migraine, unspecified, not intractable, with status migrainosus: Secondary | ICD-10-CM

## 2020-03-28 MED ORDER — AMPHETAMINE-DEXTROAMPHET ER 10 MG PO CP24: 10.0000 mg | ORAL_CAPSULE | Freq: Every day | ORAL | 0 refills | Status: DC

## 2020-03-28 NOTE — Addendum Note (Signed)
Addended by: Jomarie Longs on: 03/28/2020 06:52 AM   Modules accepted: Orders

## 2020-04-05 ENCOUNTER — Ambulatory Visit (INDEPENDENT_AMBULATORY_CARE_PROVIDER_SITE_OTHER): Payer: Self-pay | Admitting: Orthopedic Surgery

## 2020-04-05 DIAGNOSIS — M19011 Primary osteoarthritis, right shoulder: Secondary | ICD-10-CM

## 2020-04-08 ENCOUNTER — Encounter: Payer: Self-pay | Admitting: Orthopedic Surgery

## 2020-04-08 DIAGNOSIS — M19011 Primary osteoarthritis, right shoulder: Secondary | ICD-10-CM

## 2020-04-08 MED ORDER — BUPIVACAINE HCL 0.25 % IJ SOLN
0.6600 mL | INTRAMUSCULAR | Status: AC | PRN
  Administered 2020-04-08: .66 mL via INTRA_ARTICULAR

## 2020-04-08 MED ORDER — METHYLPREDNISOLONE ACETATE 40 MG/ML IJ SUSP
13.3300 mg | INTRAMUSCULAR | Status: AC | PRN
  Administered 2020-04-08: 13.33 mg via INTRA_ARTICULAR

## 2020-04-08 MED ORDER — LIDOCAINE HCL 1 % IJ SOLN
3.0000 mL | INTRAMUSCULAR | Status: AC | PRN
  Administered 2020-04-08: 3 mL

## 2020-04-08 NOTE — Progress Notes (Signed)
Office Visit Note   Patient: Ellen Short           Date of Birth: 06-20-1980           MRN: [id number] Visit Date: 04/05/2020 Requested by: Jomarie Longs, PA-C [address] [place],  [place] [address] PCP: Nolene Ebbs  Subjective: Chief Complaint  Patient presents with  . Right Shoulder - Pain    HPI: Ellen Short is a 39 year old patient with history of right shoulder pain.  She did very well with an AC joint injection last year until August when she was at the beach and she was trying to keep the child from falling.  Had a pulling sensation on that right arm.  Now she reports relatively constant pain.  The calcific tendinitis pain improved with intervention performed last year.  The pain is keeping her awake at night.  Biofreeze helps some.  She is not taking any other medications.  Reports primarily anterior pain as well as some pain in the biceps region.  No new mechanical symptoms.              ROS: All systems reviewed are negative as they relate to the chief complaint within the history of present illness.  Patient denies  fevers or chills.   Assessment & Plan: Visit Diagnoses:  1. Arthritis of right acromioclavicular joint     Plan: Impression is recurrent right shoulder pain.  Exam does not show any frozen shoulder.  She feels like this is the same type of pain which had very good relief with AC joint injection last year.  Plan is to repeat that injection under ultrasound guidance today.  If her symptoms persist we could consider repeat evaluation and possible subacromial injection.  Follow-up as needed.  Recurrent calcific tendinitis very unlikely.  Follow-Up Instructions: Return if symptoms worsen or fail to improve.   Orders:  No orders of the defined types were placed in this encounter.  No orders of the defined types were placed in this encounter.     Procedures: Medium Joint Inj: R acromioclavicular on 04/08/2020 9:54  AM Indications: diagnostic evaluation and pain Details: 25 G 1.5 in needle, ultrasound-guided superior approach Medications: 3 mL lidocaine 1 %; 0.66 mL bupivacaine 0.25 %; 13.33 mg methylPREDNISolone acetate 40 MG/ML Outcome: tolerated well, no immediate complications Procedure, treatment alternatives, risks and benefits explained, specific risks discussed. Consent was given by the patient. Immediately prior to procedure a time out was called to verify the correct patient, procedure, equipment, support staff and site/side marked as required. Patient was prepped and draped in the usual sterile fashion.       Clinical Data: No additional findings.  Objective: Vital Signs: There were no vitals taken for this visit.  Physical Exam:   Constitutional: Patient appears well-developed HEENT:  Head: Normocephalic Eyes:EOM are normal Neck: Normal range of motion Cardiovascular: Normal rate Pulmonary/chest: Effort normal Neurologic: Patient is alert Skin: Skin is warm Psychiatric: Patient has normal mood and affect    Ortho Exam: Ortho exam demonstrates full active and passive range of motion of the right shoulder.  She does have some AC joint tenderness as well as a little bit of anterior tenderness.  Rotator cuff strength is good infraspinatus supraspinatus subscap muscle testing.  No restriction of external rotation of 15 degrees of abduction.  Specialty Comments:  No specialty comments available.  Imaging: No results found.   PMFS History: Patient Active Problem List  Diagnosis Date Noted  . Vaginal itching 01/07/2020  . History of attention deficit hyperactivity disorder (ADHD) 11/29/2019  . Inattention 11/29/2019  . Psoriasis 11/29/2019  . Family history of blood clots 11/26/2019  . Stress due to marital problems 01/19/2019  . Panic disorder 01/19/2019  . Menstrual migraine without status migrainosus, not intractable 11/02/2018  . Migraine without aura and without  status migrainosus, not intractable 06/18/2018  . Intertrigo 06/18/2018  . External hemorrhoid 03/09/2018  . PMDD (premenstrual dysphoric disorder) 12/03/2017  . Dermatitis of external ear 12/03/2017  . Calcific tendinitis of right shoulder 10/27/2017   Past Medical History:  Diagnosis Date  . Complication of anesthesia    ITCHING  . H/O varicella   . Infection    UTI WITH PREGNANCY  . Migraine    MIGRAINES  . Yeast infection     Family History  Problem Relation Age of Onset  . Rheum arthritis Father   . Birth defects Son        HAS ONLY ONE KIDNEY  . Miscarriages / Stillbirths Maternal Grandmother   . Parkinsonism Maternal Grandmother     Past Surgical History:  Procedure Laterality Date  . CESAREAN SECTION     c/s times 4   . CESAREAN SECTION N/A 04/12/2015   Procedure: CESAREAN SECTION;  Surgeon: Reva Bores, MD;  Location: [facility];  Service: Obstetrics;  Laterality: N/A;  . INTRAUTERINE DEVICE INSERTION     [place]  . TONSILLECTOMY  AGE 2  . WISDOM TOOTH EXTRACTION  AGE 42   Social History   Occupational History  . Occupation: HOMEMAKER  Tobacco Use  . Smoking status: Never Smoker  . Smokeless tobacco: Never Used  Vaping Use  . Vaping Use: Never used  Substance and Sexual Activity  . Alcohol use: Never  . Drug use: Never  . Sexual activity: Yes    Partners: Male    Birth control/protection: None, Surgical

## 2020-05-06 ENCOUNTER — Other Ambulatory Visit: Payer: Self-pay | Admitting: Physician Assistant

## 2020-05-06 DIAGNOSIS — G43009 Migraine without aura, not intractable, without status migrainosus: Secondary | ICD-10-CM

## 2020-05-22 ENCOUNTER — Ambulatory Visit: Payer: Self-pay | Admitting: Physician Assistant

## 2020-05-24 ENCOUNTER — Telehealth (INDEPENDENT_AMBULATORY_CARE_PROVIDER_SITE_OTHER): Payer: HRSA Program | Admitting: Physician Assistant

## 2020-05-24 ENCOUNTER — Encounter: Payer: Self-pay | Admitting: Physician Assistant

## 2020-05-24 DIAGNOSIS — Z8659 Personal history of other mental and behavioral disorders: Secondary | ICD-10-CM

## 2020-05-24 DIAGNOSIS — U071 COVID-19: Secondary | ICD-10-CM | POA: Diagnosis not present

## 2020-05-24 DIAGNOSIS — M2550 Pain in unspecified joint: Secondary | ICD-10-CM

## 2020-05-24 DIAGNOSIS — M2560 Stiffness of unspecified joint, not elsewhere classified: Secondary | ICD-10-CM

## 2020-05-24 DIAGNOSIS — R41840 Attention and concentration deficit: Secondary | ICD-10-CM | POA: Diagnosis not present

## 2020-05-24 DIAGNOSIS — L409 Psoriasis, unspecified: Secondary | ICD-10-CM

## 2020-05-24 DIAGNOSIS — G43901 Migraine, unspecified, not intractable, with status migrainosus: Secondary | ICD-10-CM

## 2020-05-24 DIAGNOSIS — Z8261 Family history of arthritis: Secondary | ICD-10-CM

## 2020-05-24 MED ORDER — AMPHETAMINE-DEXTROAMPHET ER 20 MG PO CP24: 20.0000 mg | ORAL_CAPSULE | ORAL | 0 refills | Status: DC

## 2020-05-24 MED ORDER — PROMETHAZINE HCL 12.5 MG PO TABS: ORAL_TABLET | ORAL | 1 refills | Status: DC

## 2020-05-24 MED ORDER — BETAMETHASONE DIPROPIONATE AUG 0.05 % EX CREA: TOPICAL_CREAM | Freq: Two times a day (BID) | CUTANEOUS | 1 refills | Status: DC

## 2020-05-24 NOTE — Progress Notes (Signed)
LVM advising pt that I was calling to do her prescreening prior to her virtual visit w/Jade.

## 2020-05-24 NOTE — Progress Notes (Signed)
Pt f/u  on medication that she taking no other concern stated by the patient

## 2020-05-24 NOTE — Progress Notes (Signed)
Patient ID: Ellen Short, female   DOB: 07/08/1980, 40 y.o.   MRN: [id number] .Marland KitchenVirtual Visit via Video Note  I connected with Marko Stai on 05/24/2020 at  3:00 PM EST by a video enabled telemedicine application and verified that I am speaking with the correct person using two identifiers.  Location: Patient: car Provider: clinic  .Marland KitchenParticipating in visit:  Patient: Ellen Short Provider: Iran Planas PA-C   I discussed the limitations of evaluation and management by telemedicine and the availability of in person appointments. The patient expressed understanding and agreed to proceed.  History of Present Illness: Pt patient is a 40 year old female with an intention and psoriasis who presents to the clinic for medication refill.  She was started on stimulant for her history of ADHD and inattention.  She is doing better on it.  She does feel like it runs out early in the afternoon.  She would like a dose increase.  She denies any problems sleeping, palpitations, headaches.  She does have her formal testing scheduled for April.  She currently does have COVID but doing well with it.  Other than fatigue and residual call no significant shortness of breath fever.  She does have some psoriasis that is not responding to her clobetasol that she has been previously given.  Clobetasol has worked for years but recently not working as well.  She would like something else.  She does mention ongoing problem with multiple joint pain and joint stiffness.  Does seem to be worse in the morning.  She has been having this go on for many months just has not mentioned it.  Her father does have rheumatoid arthritis.  She wonders about RA.  Father ra joint pain/skin hand swelling and stiff  Active Ambulatory Problems    Diagnosis Date Noted   Calcific tendinitis of right shoulder 10/27/2017   PMDD (premenstrual dysphoric disorder) 12/03/2017   Dermatitis of external ear 12/03/2017   External  hemorrhoid 03/09/2018   Migraine without aura and without status migrainosus, not intractable 06/18/2018   Intertrigo 06/18/2018   Menstrual migraine without status migrainosus, not intractable 11/02/2018   Stress due to marital problems 01/19/2019   Panic disorder 01/19/2019   Family history of blood clots 11/26/2019   History of attention deficit hyperactivity disorder (ADHD) 11/29/2019   Inattention 11/29/2019   Psoriasis 11/29/2019   Vaginal itching 01/07/2020   Resolved Ambulatory Problems    Diagnosis Date Noted   Supervision of normal pregnancy 08/29/2014   AMA (advanced maternal age) multigravida 35+ 09/06/2014   Previous cesarean delivery affecting pregnancy, antepartum 09/26/2014   [redacted] weeks gestation of pregnancy    Encounter for fetal anatomic survey    Advanced maternal age in multigravida    Recurrent UTI (urinary tract infection) complicating pregnancy 60/67/7034   Back pain affecting pregnancy in third trimester 01/09/2015   Term pregnancy delivered 04/12/2015   Past Medical History:  Diagnosis Date   Complication of anesthesia    H/O varicella    Infection    Migraine    Yeast infection    Reviewed med, allergy, problem list.     Observations/Objective: No acute distress Normal mood and appearance Normal breathing   Assessment and Plan: Marland KitchenMarland KitchenDiagnoses and all orders for this visit:  Arthralgia of multiple joints -     Cyclic citrul peptide antibody, IgG -     Rheumatoid factor -     CBC with Differential/Platelet -     C-reactive protein -     Sed  Rate (ESR) -     ANA -     COMPLETE METABOLIC PANEL WITH GFR  Status migrainosus -     promethazine (PHENERGAN) 12.5 MG tablet; TAKE 1 TO 2 TABLETS BY MOUTH EVERY 8 HOURS AS NEEDED FOR NAUSEA OR VOMITING  COVID-19 virus infection  Inattention -     amphetamine-dextroamphetamine (ADDERALL XR) 20 MG 24 hr capsule; Take 1 capsule (20 mg total) by mouth every morning. -      amphetamine-dextroamphetamine (ADDERALL XR) 20 MG 24 hr capsule; Take 1 capsule (20 mg total) by mouth every morning. -     amphetamine-dextroamphetamine (ADDERALL XR) 20 MG 24 hr capsule; Take 1 capsule (20 mg total) by mouth every morning.  History of attention deficit hyperactivity disorder (ADHD) -     amphetamine-dextroamphetamine (ADDERALL XR) 20 MG 24 hr capsule; Take 1 capsule (20 mg total) by mouth every morning. -     amphetamine-dextroamphetamine (ADDERALL XR) 20 MG 24 hr capsule; Take 1 capsule (20 mg total) by mouth every morning. -     amphetamine-dextroamphetamine (ADDERALL XR) 20 MG 24 hr capsule; Take 1 capsule (20 mg total) by mouth every morning.  Psoriasis -     augmented betamethasone dipropionate (DIPROLENE-AF) 0.05 % cream; Apply topically 2 (two) times daily.   Pt has appt with ADHD referral in April.  Increased adderall to 45m pt request.  Pt seems to be doing well with covid. Discussed quarantine schedule. Discussed symptomatic care. Follow up as needed or if symptoms worsen.   New topical steroid prescribed.   Labs ordered to evaluate multiple joint pain and stiffness.   Follow Up Instructions:    I discussed the assessment and treatment plan with the patient. The patient was provided an opportunity to ask questions and all were answered. The patient agreed with the plan and demonstrated an understanding of the instructions.   The patient was advised to call back or seek an in-person evaluation if the symptoms worsen or if the condition fails to improve as anticipated.    JIran Planas PA-C

## 2020-05-29 DIAGNOSIS — M2560 Stiffness of unspecified joint, not elsewhere classified: Secondary | ICD-10-CM | POA: Insufficient documentation

## 2020-05-29 DIAGNOSIS — M2550 Pain in unspecified joint: Secondary | ICD-10-CM | POA: Insufficient documentation

## 2020-05-29 DIAGNOSIS — Z8261 Family history of arthritis: Secondary | ICD-10-CM | POA: Insufficient documentation

## 2020-06-14 ENCOUNTER — Ambulatory Visit (INDEPENDENT_AMBULATORY_CARE_PROVIDER_SITE_OTHER): Payer: Self-pay | Admitting: Orthopedic Surgery

## 2020-06-14 DIAGNOSIS — M19011 Primary osteoarthritis, right shoulder: Secondary | ICD-10-CM

## 2020-06-15 ENCOUNTER — Encounter: Payer: Self-pay | Admitting: Orthopedic Surgery

## 2020-06-15 NOTE — Progress Notes (Signed)
Office Visit Note   Patient: Ellen Short           Date of Birth: [birth year]           MRN: [id number] Visit Date: 06/14/2020 Requested by: Jomarie Longs, PA-C [address] [place],  [place] [address] PCP: Nolene Ebbs  Subjective: Chief Complaint  Patient presents with  . Right Shoulder - Pain    HPI: Ellen Short is a 40 year old patient with right shoulder pain.  She has a long history of shoulder pain.  Had 1 year of relief with AC joint injection recently.  Had another [facility] joint injection and only had about 3 or 4 days of relief.  States "I cannot function".  Hard for her to sleep on the right-hand side.  Pain to the superior aspect of the shoulder with no radicular component.  Motrin has not been helpful.  This is affecting her sleep.              ROS: All systems reviewed are negative as they relate to the chief complaint within the history of present illness.  Patient denies  fevers or chills.   Assessment & Plan: Visit Diagnoses:  1. Arthritis of right acromioclavicular joint     Plan: Impression is symptomatic right shoulder AC joint arthritis.  She has had previous injections in the [facility] joint which is giving her good relief.  Sleep is really the big issue now.  Would not want to do any more injections particularly since the last injection only gave her 3 to 4 days of relief but that relief was good.  I think at this time it would be reasonable to proceed with arthroscopic distal clavicle excision with subacromial decompression and examination of the rotator cuff and labrum.  Risk and benefits of the procedure discussed include not limited to infection nerve vessel damage incomplete pain relief as well as shoulder stiffness.  Patient understands risk benefits and wishes to proceed.  All questions answered.  Discussed with her the expected rehabilitation involved as well.  Follow-Up Instructions: No follow-ups on file.   Orders:  No orders of  the defined types were placed in this encounter.  No orders of the defined types were placed in this encounter.     Procedures: No procedures performed   Clinical Data: No additional findings.  Objective: Vital Signs: There were no vitals taken for this visit.  Physical Exam:   Constitutional: Patient appears well-developed HEENT:  Head: Normocephalic Eyes:EOM are normal Neck: Normal range of motion Cardiovascular: Normal rate Pulmonary/chest: Effort normal Neurologic: Patient is alert Skin: Skin is warm Psychiatric: Patient has normal mood and affect    Ortho Exam: Ortho exam demonstrates full active and passive range of motion of the cervical spine.  She does have discrete AC joint tenderness on the right not present on the left.  Motor sensory function of the hand is intact.  No other masses lymphadenopathy or skin changes noted in that shoulder girdle region.  Does have pain with crossarm adduction.  Rotator cuff strength is good.  Negative apprehension relocation testing.  No restriction of external rotation of 15 degrees of abduction with at 50 degrees present bilaterally.  Specialty Comments:  No specialty comments available.  Imaging: No results found.   PMFS History: Patient Active Problem List   Diagnosis Date Noted  . Family history of rheumatoid arthritis 05/29/2020  . Arthralgia of multiple joints 05/29/2020  . Joint stiffness 05/29/2020  .  Vaginal itching 01/07/2020  . History of attention deficit hyperactivity disorder (ADHD) 11/29/2019  . Inattention 11/29/2019  . Psoriasis 11/29/2019  . Family history of blood clots 11/26/2019  . Stress due to marital problems 01/19/2019  . Panic disorder 01/19/2019  . Menstrual migraine without status migrainosus, not intractable 11/02/2018  . Migraine without aura and without status migrainosus, not intractable 06/18/2018  . Intertrigo 06/18/2018  . External hemorrhoid 03/09/2018  . PMDD (premenstrual  dysphoric disorder) 12/03/2017  . Dermatitis of external ear 12/03/2017  . Calcific tendinitis of right shoulder 10/27/2017   Past Medical History:  Diagnosis Date  . Complication of anesthesia    ITCHING  . H/O varicella   . Infection    UTI WITH PREGNANCY  . Migraine    MIGRAINES  . Yeast infection     Family History  Problem Relation Age of Onset  . Rheum arthritis Father   . Birth defects Son        HAS ONLY ONE KIDNEY  . Miscarriages / Stillbirths Maternal Grandmother   . Parkinsonism Maternal Grandmother     Past Surgical History:  Procedure Laterality Date  . CESAREAN SECTION     c/s times 4   . CESAREAN SECTION N/A 04/12/2015   Procedure: CESAREAN SECTION;  Surgeon: Reva Bores, MD;  Location: [facility];  Service: Obstetrics;  Laterality: N/A;  . INTRAUTERINE DEVICE INSERTION     Paragard  . TONSILLECTOMY  AGE 29  . WISDOM TOOTH EXTRACTION  AGE 16   Social History   Occupational History  . Occupation: HOMEMAKER  Tobacco Use  . Smoking status: Never Smoker  . Smokeless tobacco: Never Used  Vaping Use  . Vaping Use: Never used  Substance and Sexual Activity  . Alcohol use: Never  . Drug use: Never  . Sexual activity: Yes    Partners: Male    Birth control/protection: None, Surgical

## 2020-08-05 ENCOUNTER — Encounter: Payer: Self-pay | Admitting: Physician Assistant

## 2020-09-11 ENCOUNTER — Ambulatory Visit (INDEPENDENT_AMBULATORY_CARE_PROVIDER_SITE_OTHER): Payer: Self-pay | Admitting: Psychology

## 2020-09-11 DIAGNOSIS — F411 Generalized anxiety disorder: Secondary | ICD-10-CM

## 2020-09-26 ENCOUNTER — Ambulatory Visit: Payer: Self-pay | Admitting: Psychology

## 2020-09-27 ENCOUNTER — Other Ambulatory Visit: Payer: Self-pay | Admitting: Physician Assistant

## 2020-09-27 DIAGNOSIS — G43009 Migraine without aura, not intractable, without status migrainosus: Secondary | ICD-10-CM

## 2020-10-05 ENCOUNTER — Ambulatory Visit: Payer: Self-pay | Admitting: Psychology

## 2020-10-10 ENCOUNTER — Ambulatory Visit: Payer: Self-pay | Admitting: Psychology

## 2020-10-11 ENCOUNTER — Ambulatory Visit (INDEPENDENT_AMBULATORY_CARE_PROVIDER_SITE_OTHER): Payer: Self-pay | Admitting: Psychology

## 2020-10-11 DIAGNOSIS — F411 Generalized anxiety disorder: Secondary | ICD-10-CM

## 2020-10-11 DIAGNOSIS — F900 Attention-deficit hyperactivity disorder, predominantly inattentive type: Secondary | ICD-10-CM

## 2020-11-15 ENCOUNTER — Other Ambulatory Visit: Payer: Self-pay | Admitting: Physician Assistant

## 2020-11-15 ENCOUNTER — Encounter: Payer: Self-pay | Admitting: Physician Assistant

## 2020-11-15 DIAGNOSIS — Z8659 Personal history of other mental and behavioral disorders: Secondary | ICD-10-CM

## 2020-11-15 DIAGNOSIS — R41840 Attention and concentration deficit: Secondary | ICD-10-CM

## 2020-11-15 DIAGNOSIS — F410 Panic disorder [episodic paroxysmal anxiety] without agoraphobia: Secondary | ICD-10-CM

## 2020-11-15 DIAGNOSIS — F3281 Premenstrual dysphoric disorder: Secondary | ICD-10-CM

## 2020-11-24 ENCOUNTER — Ambulatory Visit (INDEPENDENT_AMBULATORY_CARE_PROVIDER_SITE_OTHER): Payer: Self-pay | Admitting: Physician Assistant

## 2020-11-24 ENCOUNTER — Other Ambulatory Visit: Payer: Self-pay

## 2020-11-24 ENCOUNTER — Ambulatory Visit: Payer: Self-pay | Admitting: Physician Assistant

## 2020-11-24 VITALS — BP 114/60 | HR 74 | Ht 62.0 in | Wt 210.0 lb

## 2020-11-24 DIAGNOSIS — G43009 Migraine without aura, not intractable, without status migrainosus: Secondary | ICD-10-CM

## 2020-11-24 DIAGNOSIS — Z8659 Personal history of other mental and behavioral disorders: Secondary | ICD-10-CM

## 2020-11-24 DIAGNOSIS — F902 Attention-deficit hyperactivity disorder, combined type: Secondary | ICD-10-CM

## 2020-11-24 DIAGNOSIS — R41840 Attention and concentration deficit: Secondary | ICD-10-CM

## 2020-11-24 DIAGNOSIS — Z8249 Family history of ischemic heart disease and other diseases of the circulatory system: Secondary | ICD-10-CM

## 2020-11-24 DIAGNOSIS — F3281 Premenstrual dysphoric disorder: Secondary | ICD-10-CM

## 2020-11-24 DIAGNOSIS — G43901 Migraine, unspecified, not intractable, with status migrainosus: Secondary | ICD-10-CM

## 2020-11-24 DIAGNOSIS — F410 Panic disorder [episodic paroxysmal anxiety] without agoraphobia: Secondary | ICD-10-CM

## 2020-11-24 MED ORDER — AMPHETAMINE-DEXTROAMPHET ER 20 MG PO CP24: 20.0000 mg | ORAL_CAPSULE | ORAL | 0 refills | Status: DC

## 2020-11-24 MED ORDER — CLONAZEPAM 0.5 MG PO TABS: 0.5000 mg | ORAL_TABLET | Freq: Two times a day (BID) | ORAL | 0 refills | Status: DC | PRN

## 2020-11-24 MED ORDER — KETOROLAC TROMETHAMINE 60 MG/2ML IM SOLN
60.0000 mg | Freq: Once | INTRAMUSCULAR | Status: AC
  Administered 2020-11-24: 60 mg via INTRAMUSCULAR

## 2020-11-24 MED ORDER — PROMETHAZINE HCL 12.5 MG PO TABS: ORAL_TABLET | ORAL | 1 refills | Status: DC

## 2020-11-24 MED ORDER — DEXAMETHASONE SODIUM PHOSPHATE 10 MG/ML IJ SOLN
8.0000 mg | Freq: Once | INTRAMUSCULAR | Status: AC
  Administered 2020-11-24: 8 mg via INTRAMUSCULAR

## 2020-11-24 MED ORDER — CITALOPRAM HYDROBROMIDE 20 MG PO TABS: 40.0000 mg | ORAL_TABLET | Freq: Every evening | ORAL | 3 refills | Status: DC

## 2020-11-24 MED ORDER — NORETHINDRONE 0.35 MG PO TABS: 1.0000 | ORAL_TABLET | Freq: Every day | ORAL | 11 refills | Status: DC

## 2020-11-24 NOTE — Progress Notes (Addendum)
Subjective:    Patient ID: Ellen Short, female    DOB: 15-Dec-1980, 40 y.o.   MRN: [id number]  HPI Patient is a 40 year old obese female with ADHD, MDD, GAD, panic disorder who presents to the clinic for medication refills.  She is feeling the best she has felt in a long time.  She did get her formal testing and was told she has all of the above and is being treated appropriately.  She denies any significant concerns.  She did not bring in formal testing today for and we have not received records.  She denies any suicidal thoughts or homicidal idealizations.  She does have some issues with her migraines.  They are worse around her menstrual cycle.  She is concerned about taking any hormones to regulate her cycle because of her mother's history of blood clots with being on birth control.  Her husband has a vasectomy and she does not need hormone protection.  She wonders about the minipill. She has had migraine for 4 days now. Replax usually helps to rescue. It has been helping but migraine comes right back.    .. Active Ambulatory Problems    Diagnosis Date Noted   Calcific tendinitis of right shoulder 10/27/2017   PMDD (premenstrual dysphoric disorder) 12/03/2017   Dermatitis of external ear 12/03/2017   External hemorrhoid 03/09/2018   Migraine without aura and without status migrainosus, not intractable 06/18/2018   Intertrigo 06/18/2018   Menstrual migraine without status migrainosus, not intractable 11/02/2018   Stress due to marital problems 01/19/2019   Panic disorder 01/19/2019   Family history of blood clots 11/26/2019   History of attention deficit hyperactivity disorder (ADHD) 11/29/2019   Inattention 11/29/2019   Psoriasis 11/29/2019   Vaginal itching 01/07/2020   Family history of rheumatoid arthritis 05/29/2020   Arthralgia of multiple joints 05/29/2020   Joint stiffness 05/29/2020   Attention deficit hyperactivity disorder (ADHD), combined type 11/27/2020    Resolved Ambulatory Problems    Diagnosis Date Noted   Supervision of normal pregnancy 08/29/2014   AMA (advanced maternal age) multigravida 35+ 09/06/2014   Previous cesarean delivery affecting pregnancy, antepartum 09/26/2014   [redacted] weeks gestation of pregnancy    Encounter for fetal anatomic survey    Advanced maternal age in multigravida    Recurrent UTI (urinary tract infection) complicating pregnancy 01/06/2015   Back pain affecting pregnancy in third trimester 01/09/2015   Term pregnancy delivered 04/12/2015   Past Medical History:  Diagnosis Date   Complication of anesthesia    H/O varicella    Infection    Migraine    Yeast infection     Review of Systems See HPI.     Objective:   Physical Exam Vitals reviewed.  Constitutional:      Appearance: Normal appearance. She is obese.  HENT:     Head: Normocephalic.     Mouth/Throat:     Mouth: Mucous membranes are moist.  Eyes:     General:        Right eye: No discharge.        Left eye: No discharge.     Extraocular Movements: Extraocular movements intact.     Conjunctiva/sclera: Conjunctivae normal.     Pupils: Pupils are equal, round, and reactive to light.  Cardiovascular:     Rate and Rhythm: Normal rate.  Pulmonary:     Effort: Pulmonary effort is normal.  Neurological:     General: No focal deficit present.     Mental  Status: She is alert and oriented to person, place, and time.  Psychiatric:        Mood and Affect: Mood normal.         .. Depression screen [facility] 2/9 11/24/2020 11/26/2019 01/19/2019 06/12/2018 12/03/2017  Decreased Interest 0 0 2 0 0  Down, Depressed, Hopeless 0 1 3 1  0  PHQ - 2 Score 0 1 5 1  0  Altered sleeping 0 1 3 1 3   Tired, decreased energy 1 1 3 1 2   Change in appetite 0 0 3 0 0  Feeling bad or failure about yourself  0 1 2 1  0  Trouble concentrating 0 3 3 1 2   Moving slowly or fidgety/restless 0 0 0 0 0  Suicidal thoughts 0 0 0 0 0  PHQ-9 Score 1 7 19 5 7   Difficult doing  work/chores Not difficult at all Very difficult - - -   . GAD 7 : Generalized Anxiety Score 11/24/2020 11/26/2019 01/19/2019 06/12/2018  Nervous, Anxious, on Edge 0 0 3 1  Control/stop worrying 0 0 3 1  Worry too much - different things 0 0 3 1  Trouble relaxing 0 2 3 1   Restless 0 2 0 0  Easily annoyed or irritable 1 1 0 0  Afraid - awful might happen 0 0 3 1  Total GAD 7 Score 1 5 15 5   Anxiety Difficulty Somewhat difficult Very difficult - -     Assessment & Plan:  . Ellen Short was seen today for adhd.  Diagnoses and all orders for this visit:  Migraine without aura and without status migrainosus, not intractable -     norethindrone (ORTHO MICRONOR) 0.35 MG tablet; Take 1 tablet (0.35 mg total) by mouth daily. -     ketorolac (TORADOL) injection 60 mg -     dexamethasone (DECADRON) injection 8 mg  Inattention -     amphetamine-dextroamphetamine (ADDERALL XR) 20 MG 24 hr capsule; Take 1 capsule (20 mg total) by mouth every morning. -     amphetamine-dextroamphetamine (ADDERALL XR) 20 MG 24 hr capsule; Take 1 capsule (20 mg total) by mouth every morning. -     amphetamine-dextroamphetamine (ADDERALL XR) 20 MG 24 hr capsule; Take 1 capsule (20 mg total) by mouth every morning.  History of attention deficit hyperactivity disorder (ADHD) -     amphetamine-dextroamphetamine (ADDERALL XR) 20 MG 24 hr capsule; Take 1 capsule (20 mg total) by mouth every morning. -     amphetamine-dextroamphetamine (ADDERALL XR) 20 MG 24 hr capsule; Take 1 capsule (20 mg total) by mouth every morning. -     amphetamine-dextroamphetamine (ADDERALL XR) 20 MG 24 hr capsule; Take 1 capsule (20 mg total) by mouth every morning.  PMDD (premenstrual dysphoric disorder) -     citalopram (CELEXA) 20 MG tablet; Take 2 tablets (40 mg total) by mouth every evening.  Panic disorder -     citalopram (CELEXA) 20 MG tablet; Take 2 tablets (40 mg total) by mouth every evening. -     clonazePAM (KLONOPIN) 0.5 MG tablet;  Take 1 tablet (0.5 mg total) by mouth 2 (two) times daily as needed for anxiety (panic attack).  Status migrainosus -     promethazine (PHENERGAN) 12.5 MG tablet; TAKE 1 TO 2 TABLETS BY MOUTH EVERY 8 HOURS AS NEEDED FOR NAUSEA OR VOMITING -     ketorolac (TORADOL) injection 60 mg -     dexamethasone (DECADRON) injection 8 mg  Attention deficit hyperactivity disorder (ADHD), combined  type -     amphetamine-dextroamphetamine (ADDERALL XR) 20 MG 24 hr capsule; Take 1 capsule (20 mg total) by mouth every morning. -     amphetamine-dextroamphetamine (ADDERALL XR) 20 MG 24 hr capsule; Take 1 capsule (20 mg total) by mouth every morning. -     amphetamine-dextroamphetamine (ADDERALL XR) 20 MG 24 hr capsule; Take 1 capsule (20 mg total) by mouth every morning.  Family history of blood clots  Pt is self pay and will keep follow ups to every 6 months as long as doing this well.   ADHD/MDD/GAD- will submit to get formal copy of psychology testing. Continue on adderall, celexa and as needed klonapin. Use klonapin sparingly.   Migraine- toradol/decadron cocktail for migraine given IM today. Suspect hormonal cause of migraines. Concern with estrogen due to mothers hx of blood clots on OcP. Husband has vasectomy. Consider micronor to help regulate hormones and stabilize hormonal headaches.

## 2020-11-27 ENCOUNTER — Encounter: Payer: Self-pay | Admitting: Physician Assistant

## 2020-11-27 DIAGNOSIS — F902 Attention-deficit hyperactivity disorder, combined type: Secondary | ICD-10-CM | POA: Insufficient documentation

## 2020-11-27 NOTE — Telephone Encounter (Signed)
Per patient she had testing done with Dr. Reggy Eye. Can we call for records. She already signed release.

## 2020-11-27 NOTE — Telephone Encounter (Signed)
Request for records sent

## 2020-12-19 ENCOUNTER — Other Ambulatory Visit: Payer: Self-pay | Admitting: Physician Assistant

## 2020-12-19 DIAGNOSIS — F410 Panic disorder [episodic paroxysmal anxiety] without agoraphobia: Secondary | ICD-10-CM

## 2020-12-19 MED ORDER — CLONAZEPAM 0.5 MG PO TABS: 0.5000 mg | ORAL_TABLET | Freq: Two times a day (BID) | ORAL | 0 refills | Status: DC | PRN

## 2020-12-19 NOTE — Telephone Encounter (Signed)
PDMP reviewed during this encounter.  

## 2020-12-22 ENCOUNTER — Other Ambulatory Visit: Payer: Self-pay | Admitting: Physician Assistant

## 2020-12-22 DIAGNOSIS — G43009 Migraine without aura, not intractable, without status migrainosus: Secondary | ICD-10-CM

## 2020-12-29 ENCOUNTER — Other Ambulatory Visit: Payer: Self-pay | Admitting: Physician Assistant

## 2020-12-29 DIAGNOSIS — G43009 Migraine without aura, not intractable, without status migrainosus: Secondary | ICD-10-CM

## 2021-01-15 ENCOUNTER — Other Ambulatory Visit (HOSPITAL_BASED_OUTPATIENT_CLINIC_OR_DEPARTMENT_OTHER): Payer: Self-pay | Admitting: Physician Assistant

## 2021-01-15 DIAGNOSIS — Z1231 Encounter for screening mammogram for malignant neoplasm of breast: Secondary | ICD-10-CM

## 2021-01-17 ENCOUNTER — Ambulatory Visit (INDEPENDENT_AMBULATORY_CARE_PROVIDER_SITE_OTHER): Payer: Self-pay

## 2021-01-17 ENCOUNTER — Other Ambulatory Visit: Payer: Self-pay

## 2021-01-17 DIAGNOSIS — Z1231 Encounter for screening mammogram for malignant neoplasm of breast: Secondary | ICD-10-CM

## 2021-01-23 NOTE — Progress Notes (Signed)
Normal mammogram. Follow up in 1 year.

## 2021-01-25 ENCOUNTER — Other Ambulatory Visit: Payer: Self-pay

## 2021-01-25 ENCOUNTER — Encounter: Payer: Self-pay | Admitting: Family Medicine

## 2021-01-25 ENCOUNTER — Ambulatory Visit (INDEPENDENT_AMBULATORY_CARE_PROVIDER_SITE_OTHER): Payer: Self-pay | Admitting: Family Medicine

## 2021-01-25 VITALS — BP 131/76 | HR 92 | Temp 98.4°F | Ht 62.0 in | Wt 201.1 lb

## 2021-01-25 DIAGNOSIS — J029 Acute pharyngitis, unspecified: Secondary | ICD-10-CM

## 2021-01-25 DIAGNOSIS — J019 Acute sinusitis, unspecified: Secondary | ICD-10-CM

## 2021-01-25 LAB — POCT RAPID STREP A (OFFICE): Rapid Strep A Screen: NEGATIVE

## 2021-01-25 MED ORDER — CEFDINIR 300 MG PO CAPS: 300.0000 mg | ORAL_CAPSULE | Freq: Two times a day (BID) | ORAL | 0 refills | Status: DC

## 2021-01-25 NOTE — Progress Notes (Signed)
Acute Office Visit  Subjective:    Patient ID: Ellen Short, female    DOB: 07-08-1980, 40 y.o.   MRN: [id number]  Chief Complaint  Patient presents with   Sore Throat    HPI Patient is in today for sxs x 2 weeks.  Severe sore throat, pain with swallowing.  Worse on the left side of her face.  Feels like she is getting some low-grade fevers at night and some body aches during the day.  Left ear pain.  2 negative COVID tests.  She did have a dry cough initially but that has gotten significantly better.  But in the last 4 to 5 days she now has significant nasal congestion.  And ear popping especially on the left side.  She started taking some Sudafed during the day.  She says naproxen does help with the sore throat.  Past Medical History:  Diagnosis Date   Complication of anesthesia    ITCHING   H/O varicella    Infection    UTI WITH PREGNANCY   Migraine    MIGRAINES   Yeast infection     Past Surgical History:  Procedure Laterality Date   CESAREAN SECTION     c/s times 4    CESAREAN SECTION N/A 04/12/2015   Procedure: CESAREAN SECTION;  Surgeon: Reva Bores, MD;  Location: [facility];  Service: Obstetrics;  Laterality: N/A;   INTRAUTERINE DEVICE INSERTION     Paragard   TONSILLECTOMY  AGE 54   WISDOM TOOTH EXTRACTION  AGE 37    Family History  Problem Relation Age of Onset   Rheum arthritis Father    Birth defects Son        HAS ONLY ONE KIDNEY   Miscarriages / Stillbirths Maternal Grandmother    Parkinsonism Maternal Grandmother     Social History   Socioeconomic History   Marital status: Married    Spouse name: Not on file   Number of children: 3   Years of education: 14   Highest education level: Not on file  Occupational History   Occupation: HOMEMAKER  Tobacco Use   Smoking status: Never   Smokeless tobacco: Never  Vaping Use   Vaping Use: Never used  Substance and Sexual Activity   Alcohol use: Never   Drug use: Never   Sexual activity: Yes     Partners: Male    Birth control/protection: None, Surgical  Other Topics Concern   Not on file  Social History Narrative   Not on file   Social Determinants of Health   Financial Resource Strain: Not on file  Food Insecurity: Not on file  Transportation Needs: Not on file  Physical Activity: Not on file  Stress: Not on file  Social Connections: Not on file  Intimate Partner Violence: Not on file    Outpatient Medications Prior to Visit  Medication Sig Dispense Refill   calcipotriene (DOVONOX) 0.005 % cream Apply topically 2 (two) times daily.     Fluocinolone Acetonide Body 0.01 % OIL Apply topically 2 (two) times daily as needed.     triamcinolone cream (KENALOG) 0.1 % Apply topically 2 (two) times daily as needed.     amphetamine-dextroamphetamine (ADDERALL XR) 20 MG 24 hr capsule Take 1 capsule (20 mg total) by mouth every morning. 30 capsule 0   amphetamine-dextroamphetamine (ADDERALL XR) 20 MG 24 hr capsule Take 1 capsule (20 mg total) by mouth every morning. 30 capsule 0   amphetamine-dextroamphetamine (ADDERALL XR) 20 MG 24  hr capsule Take 1 capsule (20 mg total) by mouth every morning. 30 capsule 0   baclofen (LIORESAL) 10 MG tablet Take 1 tablet (10 mg total) by mouth 2 (two) times daily as needed. 30 tablet 2   citalopram (CELEXA) 20 MG tablet Take 2 tablets (40 mg total) by mouth every evening. 180 tablet 3   clonazePAM (KLONOPIN) 0.5 MG tablet Take 1 tablet (0.5 mg total) by mouth 2 (two) times daily as needed for anxiety (panic attack). 20 tablet 0   eletriptan (RELPAX) 40 MG tablet TAKE ONE TABLET BY MOUTH AS NEEDED FOR MIGRAINE. MAY REPEAT IN TWO HOURS IN HEADACHE PERSISTS OR RECURS. MAX 2 TABLETS IN 24 HOURS 10 tablet 6   hydrocortisone (ANUSOL-HC) 2.5 % rectal cream Place 1 application rectally 2 (two) times daily as needed for hemorrhoids or anal itching. 28.35 g 2   MULTIPLE VITAMINS-MINERALS PO Take by mouth.     norethindrone (ORTHO MICRONOR) 0.35 MG tablet  Take 1 tablet (0.35 mg total) by mouth daily. 28 tablet 11   promethazine (PHENERGAN) 12.5 MG tablet TAKE 1 TO 2 TABLETS BY MOUTH EVERY 8 HOURS AS NEEDED FOR NAUSEA OR VOMITING 20 tablet 1   No facility-administered medications prior to visit.    Allergies  Allergen Reactions   Sulfa Antibiotics Swelling and Rash   Amoxicillin Hives and Itching   Erythromycin Hives and Itching   Penicillins Hives and Nausea And Vomiting    Has patient had a PCN reaction causing immediate rash, facial/tongue/throat swelling, SOB or lightheadedness with hypotension: No Has patient had a PCN reaction causing severe rash involving mucus membranes or skin necrosis: No Has patient had a PCN reaction that required hospitalization No Has patient had a PCN reaction occurring within the last 10 years: No If all of the above answers are "NO", then may proceed with Cephalosporin use.    Topamax [Topiramate] Other (See Comments)    Cognitive side effects    Review of Systems     Objective:    Physical Exam Constitutional:      Appearance: She is well-developed.  HENT:     Head: Normocephalic and atraumatic.     Comments: Started light reflex on the left TM but otherwise clear with no erythema.  Canals normal as well.  Right TM and canal are clear.    Right Ear: External ear normal.     Left Ear: External ear normal.     Nose: Nose normal.  Eyes:     Conjunctiva/sclera: Conjunctivae normal.     Pupils: Pupils are equal, round, and reactive to light.  Neck:     Thyroid: No thyromegaly.  Cardiovascular:     Rate and Rhythm: Normal rate and regular rhythm.     Heart sounds: Normal heart sounds.  Pulmonary:     Effort: Pulmonary effort is normal.     Breath sounds: Normal breath sounds. No wheezing.  Musculoskeletal:     Cervical back: Neck supple. No tenderness.  Lymphadenopathy:     Cervical: No cervical adenopathy.  Skin:    General: Skin is warm and dry.  Neurological:     Mental Status: She  is alert and oriented to person, place, and time.    BP 131/76   Pulse 92   Temp 98.4 F (36.9 C)   Ht 5\' 2"  (1.575 m)   Wt 201 lb 1.9 oz (91.2 kg)   SpO2 100%   BMI 36.79 kg/m  Wt Readings from Last 3 Encounters:  01/25/21 201 lb 1.9 oz (91.2 kg)  11/24/20 210 lb (95.3 kg)  01/07/20 209 lb (94.8 kg)    Health Maintenance Due  Topic Date Due   COVID-19 Vaccine (1) Never done   Hepatitis C Screening  Never done   INFLUENZA VACCINE  12/18/2020   PAP SMEAR-Modifier  12/03/2020    There are no preventive care reminders to display for this patient.   Lab Results  Component Value Date   TSH 3.49 12/03/2017   Lab Results  Component Value Date   WBC 8.4 12/03/2017   HGB 13.6 12/03/2017   HCT 39.2 12/03/2017   MCV 88.3 12/03/2017   PLT 191 12/03/2017   Lab Results  Component Value Date   NA 137 12/03/2017   K 3.9 12/03/2017   CO2 23 12/03/2017   GLUCOSE 96 12/03/2017   BUN 11 12/03/2017   CREATININE 0.77 12/03/2017   BILITOT 0.4 12/03/2017   ALKPHOS 44 08/18/2014   AST 14 12/03/2017   ALT 18 12/03/2017   PROT 6.6 12/03/2017   ALBUMIN 4.3 08/18/2014   CALCIUM 9.0 12/03/2017   ANIONGAP 8 08/18/2014   Lab Results  Component Value Date   CHOL 209 (H) 12/03/2017   Lab Results  Component Value Date   HDL 37 (L) 12/03/2017   Lab Results  Component Value Date   LDLCALC 127 (H) 12/03/2017   Lab Results  Component Value Date   TRIG 318 (H) 12/03/2017   Lab Results  Component Value Date   CHOLHDL 5.6 (H) 12/03/2017   No results found for: HGBA1C     Assessment & Plan:   Problem List Items Addressed This Visit   None Visit Diagnoses     Sore throat    -  Primary   Relevant Orders   POCT rapid strep A (Completed)   Acute non-recurrent sinusitis, unspecified location       Relevant Medications   cefdinir (OMNICEF) 300 MG capsule      Acute sinusitis-we will treat with Omnicef.  Call if not better in 1 week.  Unsure if this is a second viral  illness on top of initial 1 or if is the first 1 is getting worse.  Okay to continue symptomatic care.  Strep test was negative though no significant erythema on exam.  Meds ordered this encounter  Medications   cefdinir (OMNICEF) 300 MG capsule    Sig: Take 1 capsule (300 mg total) by mouth 2 (two) times daily.    Dispense:  14 capsule    Refill:  0     Nani Gasser, MD

## 2021-01-25 NOTE — Progress Notes (Signed)
p 

## 2021-03-03 ENCOUNTER — Other Ambulatory Visit: Payer: Self-pay | Admitting: Physician Assistant

## 2021-03-03 DIAGNOSIS — F410 Panic disorder [episodic paroxysmal anxiety] without agoraphobia: Secondary | ICD-10-CM

## 2021-03-05 ENCOUNTER — Other Ambulatory Visit: Payer: Self-pay | Admitting: Physician Assistant

## 2021-03-05 ENCOUNTER — Other Ambulatory Visit: Payer: Self-pay | Admitting: *Deleted

## 2021-03-05 DIAGNOSIS — G43009 Migraine without aura, not intractable, without status migrainosus: Secondary | ICD-10-CM

## 2021-03-05 DIAGNOSIS — F410 Panic disorder [episodic paroxysmal anxiety] without agoraphobia: Secondary | ICD-10-CM

## 2021-03-05 MED ORDER — CLONAZEPAM 0.5 MG PO TABS: 0.5000 mg | ORAL_TABLET | Freq: Two times a day (BID) | ORAL | 0 refills | Status: DC | PRN

## 2021-03-14 ENCOUNTER — Ambulatory Visit: Payer: Self-pay

## 2021-05-03 ENCOUNTER — Other Ambulatory Visit: Payer: Self-pay | Admitting: Physician Assistant

## 2021-05-03 ENCOUNTER — Telehealth: Payer: Self-pay | Admitting: *Deleted

## 2021-05-03 ENCOUNTER — Other Ambulatory Visit: Payer: Self-pay | Admitting: *Deleted

## 2021-05-03 DIAGNOSIS — G43901 Migraine, unspecified, not intractable, with status migrainosus: Secondary | ICD-10-CM

## 2021-05-03 NOTE — Telephone Encounter (Signed)
Pt is requesting a refill for Naproxen 500mg . Not been written since Dec 2021. Okay to fill?

## 2021-05-04 ENCOUNTER — Other Ambulatory Visit: Payer: Self-pay | Admitting: *Deleted

## 2021-05-04 DIAGNOSIS — G43009 Migraine without aura, not intractable, without status migrainosus: Secondary | ICD-10-CM

## 2021-05-04 MED ORDER — NAPROXEN 500 MG PO TABS: 500.0000 mg | ORAL_TABLET | ORAL | 0 refills | Status: DC | PRN

## 2021-05-04 NOTE — Telephone Encounter (Signed)
Refill sent to pharmacy.   

## 2021-05-28 ENCOUNTER — Ambulatory Visit (INDEPENDENT_AMBULATORY_CARE_PROVIDER_SITE_OTHER): Payer: Self-pay | Admitting: Physician Assistant

## 2021-05-28 DIAGNOSIS — Z13220 Encounter for screening for lipoid disorders: Secondary | ICD-10-CM

## 2021-05-28 DIAGNOSIS — Z91199 Patient's noncompliance with other medical treatment and regimen due to unspecified reason: Secondary | ICD-10-CM

## 2021-05-28 DIAGNOSIS — Z79899 Other long term (current) drug therapy: Secondary | ICD-10-CM

## 2021-05-28 DIAGNOSIS — Z131 Encounter for screening for diabetes mellitus: Secondary | ICD-10-CM

## 2021-05-28 DIAGNOSIS — Z1329 Encounter for screening for other suspected endocrine disorder: Secondary | ICD-10-CM

## 2021-05-28 NOTE — Progress Notes (Signed)
No show

## 2021-06-08 ENCOUNTER — Ambulatory Visit (INDEPENDENT_AMBULATORY_CARE_PROVIDER_SITE_OTHER): Payer: Self-pay | Admitting: Physician Assistant

## 2021-06-08 DIAGNOSIS — Z1329 Encounter for screening for other suspected endocrine disorder: Secondary | ICD-10-CM

## 2021-06-08 DIAGNOSIS — Z79899 Other long term (current) drug therapy: Secondary | ICD-10-CM

## 2021-06-08 DIAGNOSIS — Z91199 Patient's noncompliance with other medical treatment and regimen due to unspecified reason: Secondary | ICD-10-CM

## 2021-06-08 DIAGNOSIS — Z131 Encounter for screening for diabetes mellitus: Secondary | ICD-10-CM

## 2021-06-08 DIAGNOSIS — Z13220 Encounter for screening for lipoid disorders: Secondary | ICD-10-CM

## 2021-06-08 NOTE — Progress Notes (Signed)
No show

## 2021-07-02 ENCOUNTER — Encounter: Payer: Self-pay | Admitting: Physician Assistant

## 2021-07-02 ENCOUNTER — Other Ambulatory Visit: Payer: Self-pay

## 2021-07-02 ENCOUNTER — Ambulatory Visit (INDEPENDENT_AMBULATORY_CARE_PROVIDER_SITE_OTHER): Payer: Self-pay | Admitting: Physician Assistant

## 2021-07-02 VITALS — BP 126/63 | HR 89 | Ht 62.0 in | Wt 230.0 lb

## 2021-07-02 DIAGNOSIS — Z13220 Encounter for screening for lipoid disorders: Secondary | ICD-10-CM

## 2021-07-02 DIAGNOSIS — K644 Residual hemorrhoidal skin tags: Secondary | ICD-10-CM

## 2021-07-02 DIAGNOSIS — Z6841 Body Mass Index (BMI) 40.0 and over, adult: Secondary | ICD-10-CM

## 2021-07-02 DIAGNOSIS — F902 Attention-deficit hyperactivity disorder, combined type: Secondary | ICD-10-CM

## 2021-07-02 DIAGNOSIS — R41840 Attention and concentration deficit: Secondary | ICD-10-CM

## 2021-07-02 DIAGNOSIS — Z23 Encounter for immunization: Secondary | ICD-10-CM

## 2021-07-02 DIAGNOSIS — Z8659 Personal history of other mental and behavioral disorders: Secondary | ICD-10-CM

## 2021-07-02 DIAGNOSIS — L409 Psoriasis, unspecified: Secondary | ICD-10-CM

## 2021-07-02 DIAGNOSIS — Z1329 Encounter for screening for other suspected endocrine disorder: Secondary | ICD-10-CM

## 2021-07-02 DIAGNOSIS — M2550 Pain in unspecified joint: Secondary | ICD-10-CM

## 2021-07-02 DIAGNOSIS — K648 Other hemorrhoids: Secondary | ICD-10-CM

## 2021-07-02 DIAGNOSIS — Z79899 Other long term (current) drug therapy: Secondary | ICD-10-CM

## 2021-07-02 DIAGNOSIS — Z131 Encounter for screening for diabetes mellitus: Secondary | ICD-10-CM

## 2021-07-02 MED ORDER — AMPHETAMINE-DEXTROAMPHET ER 20 MG PO CP24: 20.0000 mg | ORAL_CAPSULE | ORAL | 0 refills | Status: DC

## 2021-07-02 MED ORDER — HYDROCORTISONE ACETATE 25 MG RE SUPP: 25.0000 mg | Freq: Two times a day (BID) | RECTAL | 2 refills | Status: AC | PRN

## 2021-07-02 MED ORDER — MELOXICAM 15 MG PO TABS: 15.0000 mg | ORAL_TABLET | Freq: Every day | ORAL | 0 refills | Status: DC

## 2021-07-02 MED ORDER — PLENITY PO CAPS: 3.0000 | ORAL_CAPSULE | Freq: Two times a day (BID) | ORAL | 11 refills | Status: DC

## 2021-07-02 NOTE — Progress Notes (Signed)
Subjective:    Patient ID: Ellen Short, female    DOB: [birth year], 41 y.o.   MRN: [id number]  HPI Pt is a 41 yo obese female with Migraines, hemorrhoids, ADHD, psoriasis, joint pain, panic disorder who needs refills.   She would like to lose weight. She wants to know her options.   Lots of overall joint pain. ? Psoriatic arthritis. On Taltz no help with joint pain as of yet.   Mood is good. Doing well with adderall and focus. No SI/HC.   Ongoing hemorrhoids needs a cheaper medication to use as needed.   .. Active Ambulatory Problems    Diagnosis Date Noted   Calcific tendinitis of right shoulder 10/27/2017   PMDD (premenstrual dysphoric disorder) 12/03/2017   Dermatitis of external ear 12/03/2017   External hemorrhoid 03/09/2018   Migraine without aura and without status migrainosus, not intractable 06/18/2018   Intertrigo 06/18/2018   Menstrual migraine without status migrainosus, not intractable 11/02/2018   Stress due to marital problems 01/19/2019   Panic disorder 01/19/2019   Family history of blood clots 11/26/2019   History of attention deficit hyperactivity disorder (ADHD) 11/29/2019   Inattention 11/29/2019   Psoriasis 11/29/2019   Vaginal itching 01/07/2020   Family history of rheumatoid arthritis 05/29/2020   Arthralgia of multiple joints 05/29/2020   Joint stiffness 05/29/2020   Attention deficit hyperactivity disorder (ADHD), combined type 11/27/2020   Internal hemorrhoid 07/04/2021   Class 3 severe obesity due to excess calories without serious comorbidity with body mass index (BMI) of 40.0 to 44.9 in adult [facility]) 07/04/2021   Resolved Ambulatory Problems    Diagnosis Date Noted   Supervision of normal pregnancy 08/29/2014   AMA (advanced maternal age) multigravida 35+ 09/06/2014   Previous cesarean delivery affecting pregnancy, antepartum 09/26/2014   [redacted] weeks gestation of pregnancy    Encounter for fetal anatomic survey    Advanced maternal age  in multigravida    Recurrent UTI (urinary tract infection) complicating pregnancy 01/06/2015   Back pain affecting pregnancy in third trimester 01/09/2015   Term pregnancy delivered 04/12/2015   Past Medical History:  Diagnosis Date   Complication of anesthesia    H/O varicella    Infection    Migraine    Yeast infection     Review of Systems See HPI.     Objective:   Physical Exam Vitals reviewed.  Constitutional:      Appearance: Normal appearance. She is obese.  HENT:     Head: Normocephalic.  Cardiovascular:     Rate and Rhythm: Normal rate and regular rhythm.     Pulses: Normal pulses.     Heart sounds: Normal heart sounds.  Pulmonary:     Effort: Pulmonary effort is normal.  Neurological:     General: No focal deficit present.     Mental Status: She is alert and oriented to person, place, and time.  Psychiatric:        Mood and Affect: Mood normal.          Assessment & Plan:  .Marland KitchenNaidelyn was seen today for follow-up.  Diagnoses and all orders for this visit:  Attention deficit hyperactivity disorder (ADHD), combined type -     amphetamine-dextroamphetamine (ADDERALL XR) 20 MG 24 hr capsule; Take 1 capsule (20 mg total) by mouth every morning. -     amphetamine-dextroamphetamine (ADDERALL XR) 20 MG 24 hr capsule; Take 1 capsule (20 mg total) by mouth every morning. -     amphetamine-dextroamphetamine (ADDERALL  XR) 20 MG 24 hr capsule; Take 1 capsule (20 mg total) by mouth every morning.  Medication management  Lipid screening  Diabetes mellitus screening  Thyroid disorder screen  Inattention -     amphetamine-dextroamphetamine (ADDERALL XR) 20 MG 24 hr capsule; Take 1 capsule (20 mg total) by mouth every morning. -     amphetamine-dextroamphetamine (ADDERALL XR) 20 MG 24 hr capsule; Take 1 capsule (20 mg total) by mouth every morning. -     amphetamine-dextroamphetamine (ADDERALL XR) 20 MG 24 hr capsule; Take 1 capsule (20 mg total) by mouth every  morning.  History of attention deficit hyperactivity disorder (ADHD) -     amphetamine-dextroamphetamine (ADDERALL XR) 20 MG 24 hr capsule; Take 1 capsule (20 mg total) by mouth every morning. -     amphetamine-dextroamphetamine (ADDERALL XR) 20 MG 24 hr capsule; Take 1 capsule (20 mg total) by mouth every morning. -     amphetamine-dextroamphetamine (ADDERALL XR) 20 MG 24 hr capsule; Take 1 capsule (20 mg total) by mouth every morning.  External hemorrhoid -     hydrocortisone (ANUSOL-HC) 25 MG suppository; Place 1 suppository (25 mg total) rectally 2 (two) times daily as needed for hemorrhoids.  Internal hemorrhoid -     hydrocortisone (ANUSOL-HC) 25 MG suppository; Place 1 suppository (25 mg total) rectally 2 (two) times daily as needed for hemorrhoids.  Psoriasis  Arthralgia of multiple joints -     meloxicam (MOBIC) 15 MG tablet; Take 1 tablet (15 mg total) by mouth daily.  Class 3 severe obesity due to excess calories without serious comorbidity with body mass index (BMI) of 40.0 to 44.9 in adult (HCC) -     Carboxymeth-Cellulose-CitricAc (PLENITY) CAPS; Take 3 capsules by mouth in the morning and at bedtime. 3 capsules p.o. twice daily with lunch and dinner  Need for influenza vaccination -     Flu Vaccine QUAD 54mo+IM (Fluarix, Fluzone & Alfiuria Quad PF)   Pt has psorasis with multiple joint pain. ? Psoriatic arthritis. Pt started on Taltz. Will hold on referral right now.  Flu shot given today.  Start mobic daily.   Adderall refilled for 3 months.   Marland Kitchen.Discussed low carb diet with 1500 calories and 80g of protein.  Exercising at least 150 minutes a week.  [employer] could be a [employer].  Self pay with no insurance Consider plenity Discussed how to take and side effects.  Follow up as needed.   Printed suppositories that should be cheaper through good [facility].  Discussed constipation diet.  Miralax as needed or colace ok for stooling.  Follow up as needed.    Follow up in 3 months.

## 2021-07-04 DIAGNOSIS — Z6841 Body Mass Index (BMI) 40.0 and over, adult: Secondary | ICD-10-CM | POA: Insufficient documentation

## 2021-07-04 DIAGNOSIS — K648 Other hemorrhoids: Secondary | ICD-10-CM | POA: Insufficient documentation

## 2021-07-06 MED ORDER — HYDROCORT-PRAMOXINE (PERIANAL) 2.5-1 % EX CREA: 1.0000 "application " | TOPICAL_CREAM | Freq: Three times a day (TID) | CUTANEOUS | 0 refills | Status: AC

## 2021-07-06 NOTE — Addendum Note (Signed)
Addended bySilvio Pate on: 07/06/2021 03:13 PM   Modules accepted: Orders

## 2021-07-06 NOTE — Telephone Encounter (Signed)
Pended, please advise

## 2021-07-27 ENCOUNTER — Encounter: Payer: Self-pay | Admitting: Physician Assistant

## 2021-08-03 ENCOUNTER — Ambulatory Visit (INDEPENDENT_AMBULATORY_CARE_PROVIDER_SITE_OTHER): Payer: Self-pay | Admitting: Physician Assistant

## 2021-08-03 ENCOUNTER — Encounter: Payer: Self-pay | Admitting: Physician Assistant

## 2021-08-03 ENCOUNTER — Other Ambulatory Visit: Payer: Self-pay

## 2021-08-03 VITALS — BP 129/74 | HR 100 | Ht 62.0 in | Wt 224.0 lb

## 2021-08-03 DIAGNOSIS — F902 Attention-deficit hyperactivity disorder, combined type: Secondary | ICD-10-CM

## 2021-08-03 DIAGNOSIS — M2550 Pain in unspecified joint: Secondary | ICD-10-CM

## 2021-08-03 DIAGNOSIS — R41840 Attention and concentration deficit: Secondary | ICD-10-CM

## 2021-08-03 DIAGNOSIS — Z8659 Personal history of other mental and behavioral disorders: Secondary | ICD-10-CM

## 2021-08-03 DIAGNOSIS — L4050 Arthropathic psoriasis, unspecified: Secondary | ICD-10-CM

## 2021-08-03 MED ORDER — DICLOFENAC SODIUM 75 MG PO TBEC: 75.0000 mg | DELAYED_RELEASE_TABLET | Freq: Two times a day (BID) | ORAL | 2 refills | Status: DC

## 2021-08-03 NOTE — Progress Notes (Signed)
? ?Subjective:  ? ? Patient ID: Ellen Short, female    DOB: [birth year], 41 y.o.   MRN: [id number] ? ?HPI ?Pt is a 41 yo female with Psoriatic Arthritis, ADHD, Migraines who presents to the clinic with worsening joint pain and stiffness that moves all over her body but mostly hands, shoulders and knees. Ellen Short is resolving her psoriasis but not helping her joint pain. She has not seen rheumatology. She has been on talz for 2-3 months. Mobic does not see to help at all.  ? ?.. ?Active Ambulatory Problems  ?  Diagnosis Date Noted  ? Calcific tendinitis of right shoulder 10/27/2017  ? PMDD (premenstrual dysphoric disorder) 12/03/2017  ? Dermatitis of external ear 12/03/2017  ? External hemorrhoid 03/09/2018  ? Migraine without aura and without status migrainosus, not intractable 06/18/2018  ? Intertrigo 06/18/2018  ? Menstrual migraine without status migrainosus, not intractable 11/02/2018  ? Stress due to marital problems 01/19/2019  ? Panic disorder 01/19/2019  ? Family history of blood clots 11/26/2019  ? History of attention deficit hyperactivity disorder (ADHD) 11/29/2019  ? Inattention 11/29/2019  ? Psoriasis 11/29/2019  ? Vaginal itching 01/07/2020  ? Family history of rheumatoid arthritis 05/29/2020  ? Arthralgia of multiple joints 05/29/2020  ? Joint stiffness 05/29/2020  ? Attention deficit hyperactivity disorder (ADHD), combined type 11/27/2020  ? Internal hemorrhoid 07/04/2021  ? Class 3 severe obesity due to excess calories without serious comorbidity with body mass index (BMI) of 40.0 to 44.9 in adult [facility]) 07/04/2021  ? Psoriatic arthritis (HCC) 08/03/2021  ? ?Resolved Ambulatory Problems  ?  Diagnosis Date Noted  ? Supervision of normal pregnancy 08/29/2014  ? AMA (advanced maternal age) multigravida 35+ 09/06/2014  ? Previous cesarean delivery affecting pregnancy, antepartum 09/26/2014  ? [redacted] weeks gestation of pregnancy   ? Encounter for fetal anatomic survey   ? Advanced maternal age in  multigravida   ? Recurrent UTI (urinary tract infection) complicating pregnancy 01/06/2015  ? Back pain affecting pregnancy in third trimester 01/09/2015  ? Term pregnancy delivered 04/12/2015  ? ?Past Medical History:  ?Diagnosis Date  ? Complication of anesthesia   ? H/O varicella   ? Infection   ? Migraine   ? Yeast infection   ? ? ? ?Review of Systems  ?All other systems reviewed and are negative. ? ?   ?Objective:  ? Physical Exam ?Vitals reviewed.  ?Constitutional:   ?   Appearance: Normal appearance. She is obese.  ?HENT:  ?   Head: Normocephalic.  ?Cardiovascular:  ?   Rate and Rhythm: Normal rate and regular rhythm.  ?   Pulses: Normal pulses.  ?   Heart sounds: Normal heart sounds.  ?Pulmonary:  ?   Effort: Pulmonary effort is normal.  ?   Breath sounds: Normal breath sounds.  ?Musculoskeletal:  ?   Comments: No red or swollen joints today.   ?Neurological:  ?   General: No focal deficit present.  ?   Mental Status: She is alert and oriented to person, place, and time.  ?Psychiatric:     ?   Mood and Affect: Mood normal.  ? ? ? ? ? ?   ?Assessment & Plan:  ?..Ellen Short was seen today for arthritis. ? ?Diagnoses and all orders for this visit: ? ?Psoriatic arthritis (HCC) ?-     diclofenac (VOLTAREN) 75 MG EC tablet; Take 1 tablet (75 mg total) by mouth 2 (two) times daily. ?-     Ambulatory referral to Rheumatology ? ?  Attention deficit hyperactivity disorder (ADHD), combined type ?-     amphetamine-dextroamphetamine (ADDERALL XR) 20 MG 24 hr capsule; Take 1 capsule (20 mg total) by mouth every morning. ?-     amphetamine-dextroamphetamine (ADDERALL XR) 20 MG 24 hr capsule; Take 1 capsule (20 mg total) by mouth every morning. ?-     amphetamine-dextroamphetamine (ADDERALL XR) 20 MG 24 hr capsule; Take 1 capsule (20 mg total) by mouth every morning. ? ?Inattention ?-     amphetamine-dextroamphetamine (ADDERALL XR) 20 MG 24 hr capsule; Take 1 capsule (20 mg total) by mouth every morning. ?-      amphetamine-dextroamphetamine (ADDERALL XR) 20 MG 24 hr capsule; Take 1 capsule (20 mg total) by mouth every morning. ?-     amphetamine-dextroamphetamine (ADDERALL XR) 20 MG 24 hr capsule; Take 1 capsule (20 mg total) by mouth every morning. ? ?History of attention deficit hyperactivity disorder (ADHD) ?-     amphetamine-dextroamphetamine (ADDERALL XR) 20 MG 24 hr capsule; Take 1 capsule (20 mg total) by mouth every morning. ?-     amphetamine-dextroamphetamine (ADDERALL XR) 20 MG 24 hr capsule; Take 1 capsule (20 mg total) by mouth every morning. ?-     amphetamine-dextroamphetamine (ADDERALL XR) 20 MG 24 hr capsule; Take 1 capsule (20 mg total) by mouth every morning. ? ?Arthralgia, unspecified joint ?-     Ambulatory referral to Rheumatology ? ? ?On Talz with dermatology and can take some time to see joint pain relief.  ?Suggest consult with rheumatology. Referral placed today.  ?Stop mobic.  ?Start diclofenac to try.  ?Adderall refilled for next 3 months. Pt is doing well.  ? ?

## 2021-08-03 NOTE — Patient Instructions (Addendum)
Stop mobic and start diclofenac twice a day.  ? ?Psoriatic Arthritis ?Psoriatic arthritis is a long-term (chronic) condition that causes pain, swelling, and stiffness in the joints. The large joints of the legs, hips, and pelvis are most often affected. The joints in the neck and back may also be affected. Sometimes psoriatic arthritis can involve joints in the toes, fingers, wrists, and elbows. ?Most people with psoriatic arthritis have a chronic skin disease that causes itchy scales and patches to form on the skin (psoriasis) before they develop psoriatic arthritis. In some cases, a person may have psoriatic arthritis before or without having psoriasis. ?Psoriatic arthritis can be mild or severe. It may come and go or cause symptoms all the time. It may affect one joint, a few joints, or many joints. In severe cases, untreated psoriatic arthritis can cause joint damage. ?What are the causes? ?The exact cause of this condition is not known. Psoriatic arthritis is an autoimmune disease. With this type of disease, the body's defense system (immune system) mistakenly attacks healthy tissues. If you have psoriasis, the immune system attacks the skin. With psoriatic arthritis, the immune system attacks joints and the tissues that connect muscles to joints (tendons). The disease may be activated by a trigger, such as an infection or stress. ?What increases the risk? ?You are more likely to develop this condition if: ?You have psoriasis. This is the biggest risk factor. Most people who develop psoriatic arthritis have had psoriasis for 5 to 10 years. ?You have a family history of psoriasis or psoriatic arthritis. ?You are between the ages of 65 and 24. ?What are the signs or symptoms? ?The main symptom of this condition is inflammation of joints and tendons. Other symptoms may include: ?Joint swelling. ?Joint pain. ?Joint stiffness, especially in the morning. ?Swollen fingers and toes. ?Pain in areas where tendons connect  to bones. ?Pain in the heel or sole of the foot. ?Pitted and weak nails. ?Tiredness (fatigue). ?Eye redness. ?How is this diagnosed? ?This condition may be diagnosed based on: ?Your symptoms and medical history. ?A physical exam. ?X-rays to look for joint inflammation or damage, especially in the joints of the pelvis (sacroiliac joints). ?Other imaging tests, such as a CT scan or MRI. ?Blood tests to look for inflammation and to rule out other causes of joint inflammation, such as gout or rheumatoid arthritis. ?How is this treated? ?The goal of treatment is to reduce pain and inflammation and protect joints from damage. Treatment depends on how severe the inflammation is and how many joints are affected. Treatment may include: ?Medicines, such as: ?NSAIDs to relieve pain and inflammation. For people with mild disease, these may be the only medicines needed. ?Disease-modifying antirheumatic drugs (DMARDs). ?Biologic medicines. These may be used for severe inflammation or if other medicines are not working. These medicines are usually given as injections or through an IV. They are very effective for many people with inflammatory arthritis, but there is an increased risk of infection. ?Physical therapy and other exercise to strengthen muscles that support joints and to prevent joint stiffness. ?A brace or splint to support a painful and swollen joint. ?Surgery to reconstruct or replace a joint. This may be an option for people with severe joint damage if other treatments have not helped. ?Follow these instructions at home: ?Medicines ?Take over-the-counter and prescription medicines only as told by your health care provider. ?Be aware of the possible side effects of your medicine and know when to call your health care provider. ?If  you are taking a biologic, let your health care provider know if you have signs or symptoms of an infection. You may need to stop treatment until your infection clears up. ?Managing pain,  stiffness, and swelling ? ?If directed, put ice on painful areas. ?Put ice in a plastic bag. ?Place a towel between your skin and the bag. ?Leave the ice on for 20 minutes, 2-3 times a day. ?If you have a brace or splint: ?Wear the brace or splint as told by your health care provider. Remove it only as told by your health care provider. ?Loosen the brace or splint if your fingers or toes tingle, become numb, or turn cold and blue. ?Keep the brace or splint clean. ?If the brace or splint is not waterproof: ?Do not let it get wet. ?Cover it with a watertight covering when you take a bath or shower. ?Activity ?Return to your normal activities as told by your health care provider. Ask your health care provider what activities are safe for you. ?Get regular exercise. Ask your health care provider what type of exercise is best for you. Your health care provider may recommend: ?Low-impact exercises such as walking, biking, or swimming. ?Exercises that include stretching, such as tai chi and yoga. ?Do not exercise when you have a flare of symptoms. Rest until the symptoms improve. ?Eating and drinking ? ?Do not drink alcohol if you are taking an NSAID. Alcohol and NSAIDs can cause stomach irritation. ?Eat a healthy diet that includes plenty of vegetables, fruits, whole grains, low-fat dairy products, and lean protein. Do not eat a lot of foods that are high in solid fats, added sugars, or salt. ?General instructions ?Do not use any products that contain nicotine or tobacco, such as cigarettes, e-cigarettes, and chewing tobacco. These can make arthritis worse. If you need help quitting, ask your health care provider. ?Maintain a healthy weight. A healthy weight will help you stay active and take stress off your joints. ?Stay up to date on all immunizations, including the yearly (annual) flu vaccine. ?Keep all follow-up visits as told by your health care provider. This is important. ?Where to find more information ?[employer]: http://jones-macias.info/ ?[employer]: www.rheumatology.org ?[place]: www.psoriasis.org ?Contact a health care provider if: ?Your signs and symptoms flare up. ?You have side effects from your medicines. ?You are taking a biologic and you have a fever or other signs of infection, such as: ?Chills. ?Feeling tired. ?Cough or sore throat. ?Loss of appetite. ?Summary ?Psoriatic arthritis is an autoimmune disease that causes joint pain, swelling, and stiffness. ?Most people with psoriatic arthritis have the skin disease called psoriasis first. ?You may have imaging tests and blood tests to help your health care provider diagnose this condition. ?The goal of treatment is to reduce pain and inflammation and protect joints from damage. ?Medicines can relieve symptoms and prevent joint damage. ?This information is not intended to replace advice given to you by your health care provider. Make sure you discuss any questions you have with your health care provider. ?Document Revised: 09/01/2018 Document Reviewed: 01/08/2018 ?Elsevier Patient Education ? [place]. ? ?

## 2021-08-06 ENCOUNTER — Encounter: Payer: Self-pay | Admitting: Physician Assistant

## 2021-08-06 MED ORDER — AMPHETAMINE-DEXTROAMPHET ER 20 MG PO CP24: 20.0000 mg | ORAL_CAPSULE | ORAL | 0 refills | Status: DC

## 2021-08-08 ENCOUNTER — Telehealth: Payer: Self-pay | Admitting: Neurology

## 2021-08-08 NOTE — Telephone Encounter (Signed)
Received note from pharmacy that RX written for Adderall to hold til 11/22/2021 was written too far in advance. Took off medication list so when it gets closer to time for refill we can send.  ?

## 2021-09-10 ENCOUNTER — Other Ambulatory Visit: Payer: Self-pay | Admitting: Physician Assistant

## 2021-09-10 DIAGNOSIS — G43901 Migraine, unspecified, not intractable, with status migrainosus: Secondary | ICD-10-CM

## 2021-10-01 ENCOUNTER — Ambulatory Visit: Payer: Self-pay | Admitting: Physician Assistant

## 2021-10-01 DIAGNOSIS — Z1329 Encounter for screening for other suspected endocrine disorder: Secondary | ICD-10-CM

## 2021-10-01 DIAGNOSIS — Z13220 Encounter for screening for lipoid disorders: Secondary | ICD-10-CM

## 2021-10-01 DIAGNOSIS — Z79899 Other long term (current) drug therapy: Secondary | ICD-10-CM

## 2021-10-01 DIAGNOSIS — Z131 Encounter for screening for diabetes mellitus: Secondary | ICD-10-CM

## 2021-11-18 ENCOUNTER — Other Ambulatory Visit: Payer: Self-pay | Admitting: Physician Assistant

## 2021-11-18 DIAGNOSIS — F3281 Premenstrual dysphoric disorder: Secondary | ICD-10-CM

## 2021-11-18 DIAGNOSIS — F410 Panic disorder [episodic paroxysmal anxiety] without agoraphobia: Secondary | ICD-10-CM

## 2021-11-18 DIAGNOSIS — G43009 Migraine without aura, not intractable, without status migrainosus: Secondary | ICD-10-CM

## 2021-11-18 DIAGNOSIS — L4050 Arthropathic psoriasis, unspecified: Secondary | ICD-10-CM

## 2022-01-16 ENCOUNTER — Other Ambulatory Visit: Payer: Self-pay | Admitting: Physician Assistant

## 2022-01-16 DIAGNOSIS — Z8659 Personal history of other mental and behavioral disorders: Secondary | ICD-10-CM

## 2022-01-16 DIAGNOSIS — R41840 Attention and concentration deficit: Secondary | ICD-10-CM

## 2022-01-16 DIAGNOSIS — F902 Attention-deficit hyperactivity disorder, combined type: Secondary | ICD-10-CM

## 2022-01-18 NOTE — Telephone Encounter (Signed)
V.Mail left for patient to return call and schedule med refill f/u for further refills!

## 2022-01-18 NOTE — Telephone Encounter (Signed)
Patient needs an appointment for further refills.   Last filled 10/25/2021  Last office visit 08/03/2021  No Show 10/01/2021

## 2022-01-28 ENCOUNTER — Other Ambulatory Visit (HOSPITAL_BASED_OUTPATIENT_CLINIC_OR_DEPARTMENT_OTHER): Payer: Self-pay | Admitting: Physician Assistant

## 2022-01-28 DIAGNOSIS — Z1231 Encounter for screening mammogram for malignant neoplasm of breast: Secondary | ICD-10-CM

## 2022-01-31 DIAGNOSIS — Z1231 Encounter for screening mammogram for malignant neoplasm of breast: Secondary | ICD-10-CM

## 2022-02-04 ENCOUNTER — Ambulatory Visit (INDEPENDENT_AMBULATORY_CARE_PROVIDER_SITE_OTHER): Payer: Self-pay | Admitting: Physician Assistant

## 2022-02-04 ENCOUNTER — Encounter: Payer: Self-pay | Admitting: Physician Assistant

## 2022-02-04 VITALS — BP 111/50 | HR 85 | Ht 62.0 in | Wt 229.0 lb

## 2022-02-04 DIAGNOSIS — Z79899 Other long term (current) drug therapy: Secondary | ICD-10-CM

## 2022-02-04 DIAGNOSIS — Z13220 Encounter for screening for lipoid disorders: Secondary | ICD-10-CM

## 2022-02-04 DIAGNOSIS — Z1329 Encounter for screening for other suspected endocrine disorder: Secondary | ICD-10-CM

## 2022-02-04 DIAGNOSIS — Z23 Encounter for immunization: Secondary | ICD-10-CM

## 2022-02-04 DIAGNOSIS — Z6841 Body Mass Index (BMI) 40.0 and over, adult: Secondary | ICD-10-CM

## 2022-02-04 DIAGNOSIS — M2550 Pain in unspecified joint: Secondary | ICD-10-CM

## 2022-02-04 DIAGNOSIS — Z131 Encounter for screening for diabetes mellitus: Secondary | ICD-10-CM

## 2022-02-04 DIAGNOSIS — R41840 Attention and concentration deficit: Secondary | ICD-10-CM

## 2022-02-04 DIAGNOSIS — F902 Attention-deficit hyperactivity disorder, combined type: Secondary | ICD-10-CM

## 2022-02-04 DIAGNOSIS — Z8659 Personal history of other mental and behavioral disorders: Secondary | ICD-10-CM

## 2022-02-04 DIAGNOSIS — L4050 Arthropathic psoriasis, unspecified: Secondary | ICD-10-CM

## 2022-02-04 MED ORDER — AMPHETAMINE-DEXTROAMPHET ER 20 MG PO CP24: 20.0000 mg | ORAL_CAPSULE | ORAL | 0 refills | Status: DC

## 2022-02-04 NOTE — Progress Notes (Signed)
Established Patient Office Visit  Subjective   Patient ID: Ellen Short, female    DOB: 28-Nov-1980  Age: 41 y.o. MRN: [id number]  Chief Complaint  Patient presents with   Follow-up    HPI Pt is a 41 yo female with ADHD, psoriatic arthritis, migraines who presents to the clinic for medication refills. Pt is doing well on adderall and staying focused. No SI/HC. Her arthritic pain is still present but NSAID keeps at [place].   Pt is not exercising or eating like she should. She is not happy with her weight.   .. Active Ambulatory Problems    Diagnosis Date Noted   Calcific tendinitis of right shoulder 10/27/2017   PMDD (premenstrual dysphoric disorder) 12/03/2017   Dermatitis of external ear 12/03/2017   External hemorrhoid 03/09/2018   Migraine without aura and without status migrainosus, not intractable 06/18/2018   Intertrigo 06/18/2018   Menstrual migraine without status migrainosus, not intractable 11/02/2018   Stress due to marital problems 01/19/2019   Panic disorder 01/19/2019   Family history of blood clots 11/26/2019   History of attention deficit hyperactivity disorder (ADHD) 11/29/2019   Inattention 11/29/2019   Psoriasis 11/29/2019   Vaginal itching 01/07/2020   Family history of rheumatoid arthritis 05/29/2020   Arthralgia of multiple joints 05/29/2020   Joint stiffness 05/29/2020   Attention deficit hyperactivity disorder (ADHD), combined type 11/27/2020   Internal hemorrhoid 07/04/2021   Class 3 severe obesity due to excess calories without serious comorbidity with body mass index (BMI) of 40.0 to 44.9 in adult ([facility]) 07/04/2021   Psoriatic arthritis ([facility]) 08/03/2021   Resolved Ambulatory Problems    Diagnosis Date Noted   Supervision of normal pregnancy 08/29/2014   AMA (advanced maternal age) multigravida 35+ 09/06/2014   Previous cesarean delivery affecting pregnancy, antepartum 09/26/2014   [redacted] weeks gestation of pregnancy    Encounter for fetal  anatomic survey    Advanced maternal age in multigravida    Recurrent UTI (urinary tract infection) complicating pregnancy 78/29/5621   Back pain affecting pregnancy in third trimester 01/09/2015   Term pregnancy delivered 04/12/2015   Past Medical History:  Diagnosis Date   Complication of anesthesia    H/O varicella    Infection    Migraine    Yeast infection     ROS See HPI.    Objective:     BP (!) 111/50   Pulse 85   Ht 5\' 2"  (1.575 m)   Wt 229 lb (103.9 kg)   SpO2 100%   BMI 41.88 kg/m  BP Readings from Last 3 Encounters:  02/04/22 (!) 111/50  08/03/21 129/74  07/02/21 126/63   Wt Readings from Last 3 Encounters:  02/04/22 229 lb (103.9 kg)  08/03/21 224 lb (101.6 kg)  07/02/21 230 lb (104.3 kg)    .Marland Kitchen    02/04/2022    2:25 PM 11/24/2020    2:58 PM 11/26/2019    3:38 PM 01/19/2019    8:16 AM 06/12/2018    5:10 PM  Depression screen PHQ 2/9  Decreased Interest 0 0 0 2 0  Down, Depressed, Hopeless 0 0 1 3 1   PHQ - 2 Score 0 0 1 5 1   Altered sleeping 1 0 1 3 1   Tired, decreased energy 1 1 1 3 1   Change in appetite 0 0 0 3 0  Feeling bad or failure about yourself  0 0 1 2 1   Trouble concentrating 0 0 3 3 1   Moving slowly or  fidgety/restless 0 0 0 0 0  Suicidal thoughts 0 0 0 0 0  PHQ-9 Score 2 1 7 19 5   Difficult doing work/chores Somewhat difficult Not difficult at all Very difficult     .    02/04/2022    2:25 PM 11/24/2020    3:05 PM 11/26/2019    3:39 PM 01/19/2019    8:15 AM  GAD 7 : Generalized Anxiety Score  Nervous, Anxious, on Edge 0 0 0 3  Control/stop worrying 0 0 0 3  Worry too much - different things 0 0 0 3  Trouble relaxing 0 0 2 3  Restless 0 0 2 0  Easily annoyed or irritable 0 1 1 0  Afraid - awful might happen 0 0 0 3  Total GAD 7 Score 0 1 5 15   Anxiety Difficulty Not difficult at all Somewhat difficult Very difficult       Physical Exam Constitutional:      Appearance: Normal appearance. She is obese.  HENT:     Head:  Normocephalic.  Neck:     Vascular: No carotid bruit.  Cardiovascular:     Rate and Rhythm: Normal rate and regular rhythm.     Pulses: Normal pulses.     Heart sounds: Normal heart sounds.  Pulmonary:     Effort: Pulmonary effort is normal.     Breath sounds: Normal breath sounds.  Musculoskeletal:     Right lower leg: No edema.     Left lower leg: No edema.  Lymphadenopathy:     Cervical: No cervical adenopathy.  Neurological:     General: No focal deficit present.     Mental Status: She is alert and oriented to person, place, and time.  Psychiatric:        Mood and Affect: Mood normal.         Assessment & Plan:  .11/1/2020[name] was seen today for follow-up.  Diagnoses and all orders for this visit:  Attention deficit hyperactivity disorder (ADHD), combined type -     amphetamine-dextroamphetamine (ADDERALL XR) 20 MG 24 hr capsule; Take 1 capsule (20 mg total) by mouth every morning. -     amphetamine-dextroamphetamine (ADDERALL XR) 20 MG 24 hr capsule; Take 1 capsule (20 mg total) by mouth every morning. -     amphetamine-dextroamphetamine (ADDERALL XR) 20 MG 24 hr capsule; Take 1 capsule (20 mg total) by mouth every morning.  Lipid screening -     Lipid Panel w/reflex Direct LDL  Diabetes mellitus screening -     COMPLETE METABOLIC PANEL WITH GFR  Thyroid disorder screen -     TSH  Medication management -     TSH -     Lipid Panel w/reflex Direct LDL -     COMPLETE METABOLIC PANEL WITH GFR -     CBC with Differential/Platelet  Inattention -     amphetamine-dextroamphetamine (ADDERALL XR) 20 MG 24 hr capsule; Take 1 capsule (20 mg total) by mouth every morning. -     amphetamine-dextroamphetamine (ADDERALL XR) 20 MG 24 hr capsule; Take 1 capsule (20 mg total) by mouth every morning. -     amphetamine-dextroamphetamine (ADDERALL XR) 20 MG 24 hr capsule; Take 1 capsule (20 mg total) by mouth every morning.  History of attention deficit hyperactivity disorder  (ADHD) -     amphetamine-dextroamphetamine (ADDERALL XR) 20 MG 24 hr capsule; Take 1 capsule (20 mg total) by mouth every morning. -     amphetamine-dextroamphetamine (ADDERALL XR)  20 MG 24 hr capsule; Take 1 capsule (20 mg total) by mouth every morning. -     amphetamine-dextroamphetamine (ADDERALL XR) 20 MG 24 hr capsule; Take 1 capsule (20 mg total) by mouth every morning.  Flu vaccine need -     Flu Vaccine QUAD 16mo+IM (Fluarix, Fluzone & Alfiuria Quad PF)  Class 3 severe obesity due to excess calories without serious comorbidity with body mass index (BMI) of 40.0 to 44.9 in adult ([facility])  Psoriatic arthritis ([facility])  Arthralgia, unspecified joint   Refilled adderall for 3 months and ok for next 3 month refill.  Follow up in 6 months.   Screening labs ordered  Continue diclofenac for arthralgia.   Marland Kitchen.Discussed low carb diet with 1500 calories and 80g of protein.  Exercising at least 150 minutes a week.  [employer] could be a Chief Technology Officer.     Return in about 6 months (around 08/05/2022).    Tandy Gaw, PA-C

## 2022-02-05 ENCOUNTER — Encounter: Payer: Self-pay | Admitting: Physician Assistant

## 2022-02-05 DIAGNOSIS — E781 Pure hyperglyceridemia: Secondary | ICD-10-CM | POA: Insufficient documentation

## 2022-02-05 DIAGNOSIS — E785 Hyperlipidemia, unspecified: Secondary | ICD-10-CM | POA: Insufficient documentation

## 2022-02-05 DIAGNOSIS — R748 Abnormal levels of other serum enzymes: Secondary | ICD-10-CM | POA: Insufficient documentation

## 2022-02-05 LAB — CBC WITH DIFFERENTIAL/PLATELET
Absolute Monocytes: 490 cells/uL (ref 200–950)
Basophils Absolute: 28 cells/uL (ref 0–200)
Basophils Relative: 0.4 %
Eosinophils Absolute: 121 cells/uL (ref 15–500)
Eosinophils Relative: 1.7 %
HCT: 37.7 % (ref 35.0–45.0)
Hemoglobin: 13.1 g/dL (ref 11.7–15.5)
Lymphs Abs: 3025 cells/uL (ref 850–3900)
MCH: 31.6 pg (ref 27.0–33.0)
MCHC: 34.7 g/dL (ref 32.0–36.0)
MCV: 90.8 fL (ref 80.0–100.0)
MPV: 11.2 fL (ref 7.5–12.5)
Monocytes Relative: 6.9 %
Neutro Abs: 3436 cells/uL (ref 1500–7800)
Neutrophils Relative %: 48.4 %
Platelets: 100 10*3/uL — ABNORMAL LOW (ref 140–400)
RBC: 4.15 10*6/uL (ref 3.80–5.10)
RDW: 12.7 % (ref 11.0–15.0)
Total Lymphocyte: 42.6 %
WBC: 7.1 10*3/uL (ref 3.8–10.8)

## 2022-02-05 LAB — COMPLETE METABOLIC PANEL WITH GFR
AG Ratio: 2 (calc) (ref 1.0–2.5)
ALT: 118 U/L — ABNORMAL HIGH (ref 6–29)
AST: 65 U/L — ABNORMAL HIGH (ref 10–30)
Albumin: 4.3 g/dL (ref 3.6–5.1)
Alkaline phosphatase (APISO): 57 U/L (ref 31–125)
BUN: 13 mg/dL (ref 7–25)
CO2: 22 mmol/L (ref 20–32)
Calcium: 8.8 mg/dL (ref 8.6–10.2)
Chloride: 106 mmol/L (ref 98–110)
Creat: 0.71 mg/dL (ref 0.50–0.99)
Globulin: 2.1 g/dL (calc) (ref 1.9–3.7)
Glucose, Bld: 94 mg/dL (ref 65–99)
Potassium: 4.5 mmol/L (ref 3.5–5.3)
Sodium: 139 mmol/L (ref 135–146)
Total Bilirubin: 0.3 mg/dL (ref 0.2–1.2)
Total Protein: 6.4 g/dL (ref 6.1–8.1)
eGFR: 109 mL/min/{1.73_m2} (ref >=60)

## 2022-02-05 LAB — LIPID PANEL W/REFLEX DIRECT LDL
Cholesterol: 256 mg/dL — ABNORMAL HIGH (ref <200)
HDL: 40 mg/dL — ABNORMAL LOW (ref >=50)
Non-HDL Cholesterol (Calc): 216 mg/dL (calc) — ABNORMAL HIGH (ref <130)
Total CHOL/HDL Ratio: 6.4 (calc) — ABNORMAL HIGH (ref <5.0)
Triglycerides: 502 mg/dL — ABNORMAL HIGH (ref <150)

## 2022-02-05 LAB — DIRECT LDL: Direct LDL: 135 mg/dL — ABNORMAL HIGH (ref <100)

## 2022-02-05 LAB — TSH: TSH: 2.85 mIU/L

## 2022-02-05 NOTE — Progress Notes (Signed)
Ellen Short,   Thyroid looks good.  Your platelets are pretty low likely side effect of taltz. When do you see dermatology again?

## 2022-02-05 NOTE — Progress Notes (Signed)
Ellen Short,   Your liver enzymes a elevated. Are you drinking alcohol right now? If so stop alcohol and all tylenol products and recheck liver enzymes in 2 weeks.   Kidney and glucose look fine.   TG are elevated. HDL, good cholesterol, is low. LDL, bad cholesterol, not to optimal goal. You need to start as statin to help lower this numbers and decrease CV risk. Thoughts?

## 2022-02-06 ENCOUNTER — Other Ambulatory Visit: Payer: Self-pay | Admitting: Neurology

## 2022-02-06 DIAGNOSIS — R748 Abnormal levels of other serum enzymes: Secondary | ICD-10-CM

## 2022-02-11 ENCOUNTER — Other Ambulatory Visit: Payer: Self-pay | Admitting: Physician Assistant

## 2022-02-11 DIAGNOSIS — Z1231 Encounter for screening mammogram for malignant neoplasm of breast: Secondary | ICD-10-CM

## 2022-02-13 DIAGNOSIS — Z1231 Encounter for screening mammogram for malignant neoplasm of breast: Secondary | ICD-10-CM

## 2022-02-18 ENCOUNTER — Ambulatory Visit: Payer: Self-pay | Admitting: Physician Assistant

## 2022-03-05 ENCOUNTER — Other Ambulatory Visit: Payer: Self-pay | Admitting: Physician Assistant

## 2022-03-05 DIAGNOSIS — G43901 Migraine, unspecified, not intractable, with status migrainosus: Secondary | ICD-10-CM

## 2022-03-05 DIAGNOSIS — F410 Panic disorder [episodic paroxysmal anxiety] without agoraphobia: Secondary | ICD-10-CM

## 2022-03-05 NOTE — Telephone Encounter (Signed)
Clonazepam last written 03/05/2021 #20 no refills  Phenergan last written 09/10/2021 #20 with 1 refill  Last appt 02/04/2022

## 2022-04-19 ENCOUNTER — Telehealth: Payer: Self-pay | Admitting: Physician Assistant

## 2022-04-19 DIAGNOSIS — R3989 Other symptoms and signs involving the genitourinary system: Secondary | ICD-10-CM

## 2022-04-19 MED ORDER — NITROFURANTOIN MONOHYD MACRO 100 MG PO CAPS: 100.0000 mg | ORAL_CAPSULE | Freq: Two times a day (BID) | ORAL | 0 refills | Status: DC

## 2022-04-19 NOTE — Progress Notes (Signed)
E-Visit for Urinary Problems  We are sorry that you are not feeling well.  Here is how we plan to help!  Based on what you shared with me it looks like you most likely have a simple urinary tract infection.  A UTI (Urinary Tract Infection) is a bacterial infection of the bladder.  Most cases of urinary tract infections are simple to treat but a key part of your care is to encourage you to drink plenty of fluids and watch your symptoms carefully.  I have prescribed MacroBid 100 mg twice a day for 5 days.  Your symptoms should gradually improve. Call us if the burning in your urine worsens, you develop worsening fever, back pain or pelvic pain or if your symptoms do not resolve after completing the antibiotic.  Urinary tract infections can be prevented by drinking plenty of water to keep your body hydrated.  Also be sure when you wipe, wipe from front to back and don't hold it in!  If possible, empty your bladder every 4 hours.  HOME CARE Drink plenty of fluids Compete the full course of the antibiotics even if the symptoms resolve Remember, when you need to go.go. Holding in your urine can increase the likelihood of getting a UTI! GET HELP RIGHT AWAY IF: You cannot urinate You get a high fever Worsening back pain occurs You see blood in your urine You feel sick to your stomach or throw up You feel like you are going to pass out  MAKE SURE YOU  Understand these instructions. Will watch your condition. Will get help right away if you are not doing well or get worse.   Thank you for choosing an e-visit.  Your e-visit answers were reviewed by a board certified advanced clinical practitioner to complete your personal care plan. Depending upon the condition, your plan could have included both over the counter or prescription medications.  Please review your pharmacy choice. Make sure the pharmacy is open so you can pick up prescription now. If there is a problem, you may contact your  provider through MyChart messaging and have the prescription routed to another pharmacy.  Your safety is important to us. If you have drug allergies check your prescription carefully.   For the next 24 hours you can use MyChart to ask questions about today's visit, request a non-urgent call back, or ask for a work or school excuse. You will get an email in the next two days asking about your experience. I hope that your e-visit has been valuable and will speed your recovery.  I have spent 5 minutes in review of e-visit questionnaire, review and updating patient chart, medical decision making and response to patient.   Racquelle M Ily Denno, PA-C  

## 2022-05-07 ENCOUNTER — Other Ambulatory Visit: Payer: Self-pay | Admitting: Physician Assistant

## 2022-05-07 DIAGNOSIS — G43009 Migraine without aura, not intractable, without status migrainosus: Secondary | ICD-10-CM

## 2022-06-14 ENCOUNTER — Other Ambulatory Visit: Payer: Self-pay | Admitting: Physician Assistant

## 2022-06-14 DIAGNOSIS — F902 Attention-deficit hyperactivity disorder, combined type: Secondary | ICD-10-CM

## 2022-06-14 DIAGNOSIS — Z8659 Personal history of other mental and behavioral disorders: Secondary | ICD-10-CM

## 2022-06-14 DIAGNOSIS — R41840 Attention and concentration deficit: Secondary | ICD-10-CM

## 2022-06-14 MED ORDER — AMPHETAMINE-DEXTROAMPHET ER 20 MG PO CP24: 20.0000 mg | ORAL_CAPSULE | ORAL | 0 refills | Status: DC

## 2022-06-15 ENCOUNTER — Other Ambulatory Visit: Payer: Self-pay | Admitting: Physician Assistant

## 2022-06-15 DIAGNOSIS — F3281 Premenstrual dysphoric disorder: Secondary | ICD-10-CM

## 2022-06-15 DIAGNOSIS — F410 Panic disorder [episodic paroxysmal anxiety] without agoraphobia: Secondary | ICD-10-CM

## 2022-06-24 ENCOUNTER — Other Ambulatory Visit: Payer: Self-pay | Admitting: Physician Assistant

## 2022-06-24 DIAGNOSIS — Z1231 Encounter for screening mammogram for malignant neoplasm of breast: Secondary | ICD-10-CM

## 2022-06-25 ENCOUNTER — Telehealth: Payer: Self-pay | Admitting: Nurse Practitioner

## 2022-06-25 DIAGNOSIS — J309 Allergic rhinitis, unspecified: Secondary | ICD-10-CM

## 2022-06-25 MED ORDER — FLUTICASONE PROPIONATE 50 MCG/ACT NA SUSP: 2.0000 | Freq: Every day | NASAL | 6 refills | Status: DC

## 2022-06-25 MED ORDER — HYDROXYZINE HCL 10 MG PO TABS: 10.0000 mg | ORAL_TABLET | Freq: Every day | ORAL | 2 refills | Status: AC

## 2022-06-25 NOTE — Progress Notes (Signed)
E visit for Allergic Rhinitis We are sorry that you are not feeling well.  Here is how we plan to help!  Based on what you have shared with me it looks like you have Allergic Rhinitis.  Rhinitis is when a reaction occurs that causes nasal congestion, runny nose, sneezing, and itching.  Most types of rhinitis are caused by an inflammation and are associated with symptoms in the eyes ears or throat. There are several types of rhinitis.  The most common are acute rhinitis, which is usually caused by a viral illness, allergic or seasonal rhinitis, and nonallergic or year-round rhinitis.  Nasal allergies occur certain times of the year.  Allergic rhinitis is caused when allergens in the air trigger the release of histamine in the body.  Histamine causes itching, swelling, and fluid to build up in the fragile linings of the nasal passages, sinuses and eyelids.  An itchy nose and clear discharge are common.  We will prescribe a low dose of hydroxyzine as this medication can also cause drowsiness. I recommend taking it at bedtime.   Meds ordered this encounter  Medications   hydrOXYzine (ATARAX) 10 MG tablet    Sig: Take 1 tablet (10 mg total) by mouth at bedtime.    Dispense:  30 tablet    Refill:  2   fluticasone (FLONASE) 50 MCG/ACT nasal spray    Sig: Place 2 sprays into both nostrils daily.    Dispense:  16 g    Refill:  6     I also would recommend a nasal spray: Flonase 2 sprays into each nostril once daily   HOME CARE:  You can use an over-the-counter saline nasal spray as needed Avoid areas where there is heavy dust, mites, or molds Stay indoors on windy days during the pollen season Keep windows closed in home, at least in bedroom; use air conditioner. Use high-efficiency house air filter Keep windows closed in car, turn AC on re-circulate Avoid playing out with dog during pollen season  GET HELP RIGHT AWAY IF:  If your symptoms do not improve within 10 days You become short of  breath You develop yellow or green discharge from your nose for over 3 days You have coughing fits  MAKE SURE YOU:  Understand these instructions Will watch your condition Will get help right away if you are not doing well or get worse  Thank you for choosing an e-visit. Your e-visit answers were reviewed by a board certified advanced clinical practitioner to complete your personal care plan. Depending upon the condition, your plan could have included both over the counter or prescription medications. Please review your pharmacy choice. Be sure that the pharmacy you have chosen is open so that you can pick up your prescription now.  If there is a problem you may message your provider in [place] to have the prescription routed to another pharmacy. Your safety is important to [place]. If you have drug allergies check your prescription carefully.  For the next 24 hours, you can use MyChart to ask questions about today's visit, request a non-urgent call back, or ask for a work or school excuse from your e-visit provider. You will get an email in the next two days asking about your experience. I hope that your e-visit has been valuable and will speed your recovery.  I spent approximately 5 minutes reviewing the patient's history, current symptoms and coordinating their care today.

## 2022-07-03 DIAGNOSIS — Z1231 Encounter for screening mammogram for malignant neoplasm of breast: Secondary | ICD-10-CM

## 2022-07-11 ENCOUNTER — Ambulatory Visit (INDEPENDENT_AMBULATORY_CARE_PROVIDER_SITE_OTHER): Payer: Self-pay

## 2022-07-11 DIAGNOSIS — Z1231 Encounter for screening mammogram for malignant neoplasm of breast: Secondary | ICD-10-CM

## 2022-07-16 NOTE — Progress Notes (Signed)
Normal mammogram. Follow up in 1 year.

## 2022-07-24 ENCOUNTER — Other Ambulatory Visit: Payer: Self-pay | Admitting: Physician Assistant

## 2022-07-24 DIAGNOSIS — F902 Attention-deficit hyperactivity disorder, combined type: Secondary | ICD-10-CM

## 2022-07-24 DIAGNOSIS — R41840 Attention and concentration deficit: Secondary | ICD-10-CM

## 2022-07-24 DIAGNOSIS — Z8659 Personal history of other mental and behavioral disorders: Secondary | ICD-10-CM

## 2022-07-24 MED ORDER — AMPHETAMINE-DEXTROAMPHET ER 20 MG PO CP24: 20.0000 mg | ORAL_CAPSULE | ORAL | 0 refills | Status: DC

## 2022-08-05 ENCOUNTER — Ambulatory Visit: Payer: Self-pay | Admitting: Physician Assistant

## 2022-08-18 ENCOUNTER — Other Ambulatory Visit: Payer: Self-pay | Admitting: Physician Assistant

## 2022-08-18 DIAGNOSIS — G43009 Migraine without aura, not intractable, without status migrainosus: Secondary | ICD-10-CM

## 2022-08-26 ENCOUNTER — Other Ambulatory Visit: Payer: Self-pay | Admitting: Physician Assistant

## 2022-08-26 DIAGNOSIS — F902 Attention-deficit hyperactivity disorder, combined type: Secondary | ICD-10-CM

## 2022-08-26 DIAGNOSIS — Z8659 Personal history of other mental and behavioral disorders: Secondary | ICD-10-CM

## 2022-08-26 DIAGNOSIS — R41840 Attention and concentration deficit: Secondary | ICD-10-CM

## 2022-08-27 NOTE — Telephone Encounter (Signed)
Message sent to patient via Mychart about need for appt  before refill.

## 2022-09-04 ENCOUNTER — Ambulatory Visit: Payer: Self-pay | Admitting: Physician Assistant

## 2022-09-04 ENCOUNTER — Encounter: Payer: Self-pay | Admitting: Physician Assistant

## 2022-09-04 ENCOUNTER — Ambulatory Visit (INDEPENDENT_AMBULATORY_CARE_PROVIDER_SITE_OTHER): Payer: Self-pay | Admitting: Physician Assistant

## 2022-09-04 VITALS — BP 110/60 | HR 74 | Ht 62.0 in | Wt 223.0 lb

## 2022-09-04 DIAGNOSIS — R232 Flushing: Secondary | ICD-10-CM

## 2022-09-04 DIAGNOSIS — F3281 Premenstrual dysphoric disorder: Secondary | ICD-10-CM

## 2022-09-04 DIAGNOSIS — R5383 Other fatigue: Secondary | ICD-10-CM | POA: Insufficient documentation

## 2022-09-04 DIAGNOSIS — Z79899 Other long term (current) drug therapy: Secondary | ICD-10-CM

## 2022-09-04 DIAGNOSIS — R41840 Attention and concentration deficit: Secondary | ICD-10-CM

## 2022-09-04 DIAGNOSIS — E66813 Obesity, class 3: Secondary | ICD-10-CM

## 2022-09-04 DIAGNOSIS — Z6841 Body Mass Index (BMI) 40.0 and over, adult: Secondary | ICD-10-CM

## 2022-09-04 DIAGNOSIS — F410 Panic disorder [episodic paroxysmal anxiety] without agoraphobia: Secondary | ICD-10-CM

## 2022-09-04 DIAGNOSIS — R748 Abnormal levels of other serum enzymes: Secondary | ICD-10-CM

## 2022-09-04 DIAGNOSIS — F902 Attention-deficit hyperactivity disorder, combined type: Secondary | ICD-10-CM

## 2022-09-04 LAB — CBC WITH DIFFERENTIAL/PLATELET
Eosinophils Relative: 1.5 %
HCT: 41.5 % (ref 35.0–45.0)
Platelets: 147 10*3/uL (ref 140–400)
RDW: 12.7 % (ref 11.0–15.0)

## 2022-09-04 MED ORDER — AMPHETAMINE-DEXTROAMPHET ER 20 MG PO CP24: 20.0000 mg | ORAL_CAPSULE | ORAL | 0 refills | Status: DC

## 2022-09-04 MED ORDER — AMPHETAMINE-DEXTROAMPHET ER 20 MG PO CP24
20.0000 mg | ORAL_CAPSULE | ORAL | 0 refills | Status: DC
Start: 2022-11-04 — End: 2023-04-16

## 2022-09-04 MED ORDER — AMPHETAMINE-DEXTROAMPHET ER 20 MG PO CP24
20.0000 mg | ORAL_CAPSULE | ORAL | 0 refills | Status: DC
Start: 2022-09-04 — End: 2022-12-26

## 2022-09-04 NOTE — Patient Instructions (Signed)
Refilled adderall and celexa Recheck labs Work on weight loss Consider home sleep study  Perimenopause Perimenopause is the normal time of a woman's life when the levels of estrogen, the female hormone produced by the ovaries, begin to decrease. This leads to changes in menstrual periods before they stop completely (menopause). Perimenopause can begin 2-8 years before menopause. During perimenopause, the ovaries may or may not produce an egg and a woman can still become pregnant. What are the causes? This condition is caused by a natural change in hormone levels that happens as you get older. What increases the risk? This condition is more likely to start at an earlier age if you have certain medical conditions or have undergone treatments, including: A tumor of the pituitary gland in the brain. A disease that affects the ovaries and hormone production. Certain cancer treatments, such as chemotherapy or hormone therapy, or radiation therapy on the pelvis. Heavy smoking and excessive alcohol use. Family history of early menopause. What are the signs or symptoms? Perimenopausal changes affect each woman differently. Symptoms of this condition may include: Hot flashes. Irregular menstrual periods. Night sweats. Changes in feelings about sex. This could be a decrease in sex drive or an increased discomfort around your sexuality. Vaginal dryness. Headaches. Mood swings. Depression. Problems sleeping (insomnia). Memory problems or trouble concentrating. Irritability. Tiredness. Weight gain. Anxiety. Trouble getting pregnant. How is this diagnosed? This condition is diagnosed based on your medical history, a physical exam, your age, your menstrual history, and your symptoms. Hormone tests may also be done. How is this treated? In some cases, no treatment is needed. You and your health care provider should make a decision together about whether treatment is necessary. Treatment will be  based on your individual condition and preferences. Various treatments are available, such as: Menopausal hormone therapy (MHT). Medicines to treat specific symptoms. Acupuncture. Vitamin or herbal supplements. Before starting treatment, make sure to let your health care provider know if you have a personal or family history of: Heart disease. Breast cancer. Blood clots. Diabetes. Osteoporosis. Follow these instructions at home: Medicines Take over-the-counter and prescription medicines only as told by your health care provider. Take vitamin supplements only as told by your health care provider. Talk with your health care provider before starting any herbal supplements. Lifestyle  Do not use any products that contain nicotine or tobacco, such as cigarettes, e-cigarettes, and chewing tobacco. If you need help quitting, ask your health care provider. Get at least 30 minutes of physical activity on 5 or more days each week. Eat a balanced diet that includes fresh fruits and vegetables, whole grains, soybeans, eggs, lean meat, and low-fat dairy. Avoid alcoholic and caffeinated beverages, as well as spicy foods. This may help prevent hot flashes. Get 7-8 hours of sleep each night. Dress in layers that can be removed to help you manage hot flashes. Find ways to manage stress, such as deep breathing, meditation, or journaling. General instructions  Keep track of your menstrual periods, including: When they occur. How heavy they are and how long they last. How much time passes between periods. Keep track of your symptoms, noting when they start, how often you have them, and how long they last. Use vaginal lubricants or moisturizers to help with vaginal dryness and improve comfort during sex. You can still become pregnant if you are having irregular periods. Make sure you use contraception during perimenopause if you do not want to get pregnant. Keep all follow-up visits. This is important.  This includes any group therapy or counseling. Contact a health care provider if: You have heavy vaginal bleeding or pass blood clots. Your period lasts more than 2 days longer than normal. Your periods are recurring sooner than 21 days. You bleed after having sex. You have pain during sex. Get help right away if you have: Chest pain, trouble breathing, or trouble talking. Severe depression. Pain when you urinate. Severe headaches. Vision problems. Summary Perimenopause is the time when a woman's body begins to move into menopause. This may happen naturally or as a result of other health problems or medical treatments. Perimenopause can begin 2-8 years before menopause, and it can last for several years. Perimenopausal symptoms can be managed through medicines, lifestyle changes, and complementary therapies such as acupuncture. This information is not intended to replace advice given to you by your health care provider. Make sure you discuss any questions you have with your health care provider. Document Revised: 10/21/2019 Document Reviewed: 10/21/2019 Elsevier Patient Education  2023 [employer].

## 2022-09-04 NOTE — Progress Notes (Signed)
Established Patient Office Visit  Subjective   Patient ID: Ellen Short, female    DOB: 27-Aug-1980  Age: 42 y.o. MRN: [id number]  Chief Complaint  Patient presents with   Follow-up    HPI Pt is a 42 yo obese female with ADHD, MDD, Anxiety who presents to the clinic for medication refills.   She is doing well on adderall. No concerns. She continues to sleep well. Anxiety not worsened. She is very tired over past 3 months and having more hot flashes. She admits to snoring. She is not exercising. No fever, chills, body aches.   .. Active Ambulatory Problems    Diagnosis Date Noted   Calcific tendinitis of right shoulder 10/27/2017   PMDD (premenstrual dysphoric disorder) 12/03/2017   Dermatitis of external ear 12/03/2017   External hemorrhoid 03/09/2018   Migraine without aura and without status migrainosus, not intractable 06/18/2018   Intertrigo 06/18/2018   Menstrual migraine without status migrainosus, not intractable 11/02/2018   Stress due to marital problems 01/19/2019   Panic disorder 01/19/2019   Family history of blood clots 11/26/2019   History of attention deficit hyperactivity disorder (ADHD) 11/29/2019   Inattention 11/29/2019   Psoriasis 11/29/2019   Vaginal itching 01/07/2020   Family history of rheumatoid arthritis 05/29/2020   Arthralgia of multiple joints 05/29/2020   Joint stiffness 05/29/2020   Attention deficit hyperactivity disorder (ADHD), combined type 11/27/2020   Internal hemorrhoid 07/04/2021   Class 3 severe obesity due to excess calories without serious comorbidity with body mass index (BMI) of 40.0 to 44.9 in adult 07/04/2021   Psoriatic arthritis 08/03/2021   Hypertriglyceridemia 02/05/2022   Dyslipidemia 02/05/2022   Elevated liver enzymes 02/05/2022   No energy 09/04/2022   Resolved Ambulatory Problems    Diagnosis Date Noted   Supervision of normal pregnancy 08/29/2014   AMA (advanced maternal age) multigravida 35+ 09/06/2014    Previous cesarean delivery affecting pregnancy, antepartum 09/26/2014   [redacted] weeks gestation of pregnancy    Encounter for fetal anatomic survey    Advanced maternal age in multigravida    Recurrent UTI (urinary tract infection) complicating pregnancy 01/06/2015   Back pain affecting pregnancy in third trimester 01/09/2015   Term pregnancy delivered 04/12/2015   Past Medical History:  Diagnosis Date   Complication of anesthesia    H/O varicella    Infection    Migraine    Yeast infection      ROS See HPI.    Objective:     BP 110/60   Pulse 74   Ht  (1.575 m)   Wt 223 lb (101.2 kg)   SpO2 99%   BMI 40.79 kg/m  BP Readings from Last 3 Encounters:  09/04/22 110/60  02/04/22 (!) 111/50  08/03/21 129/74   Wt Readings from Last 3 Encounters:  09/04/22 223 lb (101.2 kg)  02/04/22 229 lb (103.9 kg)  08/03/21 224 lb (101.6 kg)    ..    09/04/2022    3:51 PM 02/04/2022    2:25 PM 11/24/2020    2:58 PM 11/26/2019    3:38 PM 01/19/2019    8:16 AM  Depression screen PHQ 2/9  Decreased Interest 2 0 0 0 2  Down, Depressed, Hopeless 3 0 0 1 3  PHQ - 2 Score 5 0 0 1 5  Altered sleeping 3 1 0 1 3  Tired, decreased energy Change in appetite 2 0 0 0 3  Feeling bad or  failure about yourself  0 0 0 1 2  Trouble concentrating 2 0 0 3 3  Moving slowly or fidgety/restless 0 0 0 0 0  Suicidal thoughts 0 0 0 0 0  PHQ-9 Score Difficult doing work/chores Very difficult Somewhat difficult Not difficult at all Very difficult    .Marland Kitchen    09/04/2022    3:52 PM 02/04/2022    2:25 PM 11/24/2020    3:05 PM 11/26/2019    3:39 PM  GAD 7 : Generalized Anxiety Score  Nervous, Anxious, on Edge 0 0 0 0  Control/stop worrying 0 0 0 0  Worry too much - different things 0 0 0 0  Trouble relaxing 0 0 0 2  Restless 0 0 0 2  Easily annoyed or irritable 2 0 1 1  Afraid - awful might happen 0 0 0 0  Total GAD 7 Score 2 0 1 5  Anxiety Difficulty Very difficult Not difficult at  all Somewhat difficult Very difficult      Physical Exam Constitutional:      Appearance: Normal appearance. She is obese.  Cardiovascular:     Rate and Rhythm: Normal rate and regular rhythm.  Pulmonary:     Effort: Pulmonary effort is normal.     Breath sounds: Normal breath sounds.  Musculoskeletal:     Right lower leg: No edema.     Left lower leg: No edema.  Neurological:     General: No focal deficit present.     Mental Status: She is alert and oriented to person, place, and time.  Psychiatric:        Mood and Affect: Mood normal.       Assessment & Plan:  .Marland KitchenMickala was seen today for follow-up.  Diagnoses and all orders for this visit:  Attention deficit hyperactivity disorder (ADHD), combined type -     BASIC METABOLIC PANEL WITH GFR -     Hepatic function panel -     TSH + free T4 -     CBC w/Diff/Platelet -     amphetamine-dextroamphetamine (ADDERALL XR) 20 MG 24 hr capsule; Take 1 capsule (20 mg total) by mouth every morning. -     amphetamine-dextroamphetamine (ADDERALL XR) 20 MG 24 hr capsule; Take 1 capsule (20 mg total) by mouth every morning. -     amphetamine-dextroamphetamine (ADDERALL XR) 20 MG 24 hr capsule; Take 1 capsule (20 mg total) by mouth every morning.  Inattention -     amphetamine-dextroamphetamine (ADDERALL XR) 20 MG 24 hr capsule; Take 1 capsule (20 mg total) by mouth every morning. -     amphetamine-dextroamphetamine (ADDERALL XR) 20 MG 24 hr capsule; Take 1 capsule (20 mg total) by mouth every morning. -     amphetamine-dextroamphetamine (ADDERALL XR) 20 MG 24 hr capsule; Take 1 capsule (20 mg total) by mouth every morning.  Hot flashes -     BASIC METABOLIC PANEL WITH GFR -     Hepatic function panel -     TSH + free T4 -     CBC w/Diff/Platelet  Elevated liver enzymes -     Hepatic function panel  Medication management -     BASIC METABOLIC PANEL WITH GFR -     Hepatic function panel -     TSH + free T4 -     CBC  w/Diff/Platelet  No energy  Class 3 severe obesity due to excess calories without serious comorbidity with  body mass index (BMI) of 40.0 to 44.9 in adult   BP looks great on 2nd recheck Adderall refilled for 3 months Ok for another 3 months Follow up in 6 months  PHQ/GAD stable Refilled celexa  Elevated liver enzymes on last labs Recheck today  Labs to evaluate fatigue/hot flashes Pt is self pay and some medications not an option .Marland KitchenDiscussed low carb diet with 1500 calories and 80g of protein.  Exercising at least 150 minutes a week.  [employer] could be a Chief Technology Officer.  Weight loss can help with hot flashes and energy Consider home sleep test for apnea, pt declines today   Tandy Gaw, PA-C

## 2022-09-05 LAB — CBC WITH DIFFERENTIAL/PLATELET
Absolute Monocytes: 484 cells/uL (ref 200–950)
Basophils Absolute: 41 cells/uL (ref 0–200)
Basophils Relative: 0.5 %
Eosinophils Absolute: 123 cells/uL (ref 15–500)
Hemoglobin: 14.2 g/dL (ref 11.7–15.5)
Lymphs Abs: 2526 cells/uL (ref 850–3900)
MCH: 30.4 pg (ref 27.0–33.0)
MCHC: 34.2 g/dL (ref 32.0–36.0)
MCV: 88.9 fL (ref 80.0–100.0)
MPV: 11.2 fL (ref 7.5–12.5)
Monocytes Relative: 5.9 %
Neutro Abs: 5027 cells/uL (ref 1500–7800)
Neutrophils Relative %: 61.3 %
RBC: 4.67 10*6/uL (ref 3.80–5.10)
Total Lymphocyte: 30.8 %
WBC: 8.2 10*3/uL (ref 3.8–10.8)

## 2022-09-05 LAB — HEPATIC FUNCTION PANEL
AG Ratio: 2.1 (calc) (ref 1.0–2.5)
ALT: 28 U/L (ref 6–29)
AST: 21 U/L (ref 10–30)
Albumin: 4.4 g/dL (ref 3.6–5.1)
Alkaline phosphatase (APISO): 56 U/L (ref 31–125)
Bilirubin, Direct: 0.1 mg/dL (ref 0.0–0.2)
Globulin: 2.1 g/dL (calc) (ref 1.9–3.7)
Indirect Bilirubin: 0.4 mg/dL (calc) (ref 0.2–1.2)
Total Bilirubin: 0.5 mg/dL (ref 0.2–1.2)
Total Protein: 6.5 g/dL (ref 6.1–8.1)

## 2022-09-05 LAB — BASIC METABOLIC PANEL WITH GFR
BUN: 9 mg/dL (ref 7–25)
CO2: 26 mmol/L (ref 20–32)
Calcium: 9.3 mg/dL (ref 8.6–10.2)
Chloride: 105 mmol/L (ref 98–110)
Creat: 0.83 mg/dL (ref 0.50–0.99)
Glucose, Bld: 98 mg/dL (ref 65–99)
Potassium: 4.5 mmol/L (ref 3.5–5.3)
Sodium: 140 mmol/L (ref 135–146)
eGFR: 90 mL/min/{1.73_m2} (ref >=60)

## 2022-09-05 LAB — TSH+FREE T4: TSH W/REFLEX TO FT4: 3.61 mIU/L

## 2022-09-06 ENCOUNTER — Encounter: Payer: Self-pay | Admitting: Physician Assistant

## 2022-09-06 ENCOUNTER — Other Ambulatory Visit: Payer: Self-pay | Admitting: Physician Assistant

## 2022-09-06 MED ORDER — CITALOPRAM HYDROBROMIDE 20 MG PO TABS: 40.0000 mg | ORAL_TABLET | Freq: Every evening | ORAL | 1 refills | Status: DC

## 2022-09-06 NOTE — Progress Notes (Signed)
Ellen Short,   Liver enzymes back to normal. Great news.  Normal hemoglobin.  TSH normal range but not in optimal. We can continue to watch this.

## 2022-09-18 ENCOUNTER — Other Ambulatory Visit: Payer: Self-pay | Admitting: Physician Assistant

## 2022-09-18 DIAGNOSIS — G43009 Migraine without aura, not intractable, without status migrainosus: Secondary | ICD-10-CM

## 2022-09-18 MED ORDER — ELETRIPTAN HYDROBROMIDE 40 MG PO TABS
ORAL_TABLET | ORAL | 0 refills | Status: DC
Start: 2022-09-18 — End: 2022-10-21

## 2022-10-08 ENCOUNTER — Other Ambulatory Visit: Payer: Self-pay | Admitting: Physician Assistant

## 2022-10-08 ENCOUNTER — Encounter: Payer: Self-pay | Admitting: Physician Assistant

## 2022-10-08 DIAGNOSIS — G43009 Migraine without aura, not intractable, without status migrainosus: Secondary | ICD-10-CM

## 2022-10-19 ENCOUNTER — Other Ambulatory Visit: Payer: Self-pay | Admitting: Physician Assistant

## 2022-10-19 ENCOUNTER — Other Ambulatory Visit: Payer: Self-pay | Admitting: Nurse Practitioner

## 2022-10-19 DIAGNOSIS — J309 Allergic rhinitis, unspecified: Secondary | ICD-10-CM

## 2022-10-19 DIAGNOSIS — G43901 Migraine, unspecified, not intractable, with status migrainosus: Secondary | ICD-10-CM

## 2022-10-19 DIAGNOSIS — F410 Panic disorder [episodic paroxysmal anxiety] without agoraphobia: Secondary | ICD-10-CM

## 2022-10-19 DIAGNOSIS — G43009 Migraine without aura, not intractable, without status migrainosus: Secondary | ICD-10-CM

## 2022-11-23 ENCOUNTER — Other Ambulatory Visit: Payer: Self-pay | Admitting: Physician Assistant

## 2022-11-23 DIAGNOSIS — G43009 Migraine without aura, not intractable, without status migrainosus: Secondary | ICD-10-CM

## 2022-12-15 ENCOUNTER — Other Ambulatory Visit: Payer: Self-pay | Admitting: Physician Assistant

## 2022-12-15 DIAGNOSIS — G43009 Migraine without aura, not intractable, without status migrainosus: Secondary | ICD-10-CM

## 2022-12-25 ENCOUNTER — Encounter: Payer: Self-pay | Admitting: Physician Assistant

## 2022-12-25 ENCOUNTER — Other Ambulatory Visit: Payer: Self-pay | Admitting: Physician Assistant

## 2022-12-25 DIAGNOSIS — G43009 Migraine without aura, not intractable, without status migrainosus: Secondary | ICD-10-CM

## 2022-12-26 ENCOUNTER — Other Ambulatory Visit: Payer: Self-pay | Admitting: Physician Assistant

## 2022-12-26 DIAGNOSIS — F902 Attention-deficit hyperactivity disorder, combined type: Secondary | ICD-10-CM

## 2022-12-26 DIAGNOSIS — R41840 Attention and concentration deficit: Secondary | ICD-10-CM

## 2022-12-26 MED ORDER — AMPHETAMINE-DEXTROAMPHET ER 20 MG PO CP24
20.0000 mg | ORAL_CAPSULE | ORAL | 0 refills | Status: DC
Start: 2022-12-26 — End: 2023-01-29

## 2022-12-26 MED ORDER — ELETRIPTAN HYDROBROMIDE 40 MG PO TABS: ORAL_TABLET | ORAL | 0 refills | Status: DC

## 2023-01-13 ENCOUNTER — Other Ambulatory Visit: Payer: Self-pay | Admitting: Physician Assistant

## 2023-01-13 DIAGNOSIS — Z1231 Encounter for screening mammogram for malignant neoplasm of breast: Secondary | ICD-10-CM

## 2023-01-29 ENCOUNTER — Other Ambulatory Visit: Payer: Self-pay | Admitting: Family Medicine

## 2023-01-29 ENCOUNTER — Other Ambulatory Visit: Payer: Self-pay | Admitting: Physician Assistant

## 2023-01-29 DIAGNOSIS — R41840 Attention and concentration deficit: Secondary | ICD-10-CM

## 2023-01-29 DIAGNOSIS — F902 Attention-deficit hyperactivity disorder, combined type: Secondary | ICD-10-CM

## 2023-01-29 MED ORDER — AMPHETAMINE-DEXTROAMPHET ER 20 MG PO CP24
20.0000 mg | ORAL_CAPSULE | ORAL | 0 refills | Status: DC
Start: 2023-02-28 — End: 2023-04-16

## 2023-01-29 MED ORDER — AMPHETAMINE-DEXTROAMPHET ER 20 MG PO CP24
20.0000 mg | ORAL_CAPSULE | ORAL | 0 refills | Status: DC
Start: 2023-03-30 — End: 2023-01-31

## 2023-01-29 MED ORDER — AMPHETAMINE-DEXTROAMPHET ER 20 MG PO CP24
20.0000 mg | ORAL_CAPSULE | ORAL | 0 refills | Status: DC
Start: 2023-01-29 — End: 2023-02-10

## 2023-01-29 NOTE — Telephone Encounter (Signed)
..  PDMP reviewed during this encounter. UTD appt Sent refills.

## 2023-01-30 ENCOUNTER — Telehealth: Payer: Self-pay | Admitting: Physician Assistant

## 2023-01-30 NOTE — Telephone Encounter (Signed)
Patient called in stating that [facility] in [place] did not have the Adderall 20mg  and the pharmacy suggest that she get a new rx sent to [facility] on [address] in [place]. Please Advise.

## 2023-01-31 NOTE — Addendum Note (Signed)
Addended by: Elizabeth Palau on: 01/31/2023 05:42 PM   Modules accepted: Orders

## 2023-02-03 MED ORDER — AMPHETAMINE-DEXTROAMPHET ER 20 MG PO CP24
20.0000 mg | ORAL_CAPSULE | ORAL | 0 refills | Status: DC
Start: 2023-03-30 — End: 2023-04-16

## 2023-02-03 NOTE — Addendum Note (Signed)
Addended by: Jomarie Longs on: 02/03/2023 12:49 PM   Modules accepted: Orders

## 2023-02-04 ENCOUNTER — Other Ambulatory Visit: Payer: Self-pay | Admitting: Physician Assistant

## 2023-02-04 DIAGNOSIS — Z1231 Encounter for screening mammogram for malignant neoplasm of breast: Secondary | ICD-10-CM

## 2023-02-10 ENCOUNTER — Encounter: Payer: Self-pay | Admitting: Physician Assistant

## 2023-02-10 DIAGNOSIS — R41840 Attention and concentration deficit: Secondary | ICD-10-CM

## 2023-02-10 DIAGNOSIS — F902 Attention-deficit hyperactivity disorder, combined type: Secondary | ICD-10-CM

## 2023-02-10 MED ORDER — AMPHETAMINE-DEXTROAMPHET ER 20 MG PO CP24
20.0000 mg | ORAL_CAPSULE | ORAL | 0 refills | Status: DC
Start: 2023-02-10 — End: 2023-04-16

## 2023-02-10 NOTE — Telephone Encounter (Signed)
Let patient know it has been sent.

## 2023-02-16 ENCOUNTER — Other Ambulatory Visit: Payer: Self-pay | Admitting: Family Medicine

## 2023-02-16 DIAGNOSIS — G43009 Migraine without aura, not intractable, without status migrainosus: Secondary | ICD-10-CM

## 2023-02-19 ENCOUNTER — Telehealth: Payer: Self-pay | Admitting: Physician Assistant

## 2023-02-19 DIAGNOSIS — R3989 Other symptoms and signs involving the genitourinary system: Secondary | ICD-10-CM

## 2023-02-20 ENCOUNTER — Other Ambulatory Visit: Payer: Self-pay | Admitting: Nurse Practitioner

## 2023-02-20 DIAGNOSIS — J309 Allergic rhinitis, unspecified: Secondary | ICD-10-CM

## 2023-02-20 MED ORDER — NITROFURANTOIN MONOHYD MACRO 100 MG PO CAPS
100.0000 mg | ORAL_CAPSULE | Freq: Two times a day (BID) | ORAL | 0 refills | Status: DC
Start: 2023-02-20 — End: 2023-04-16

## 2023-02-20 NOTE — Progress Notes (Signed)
I have spent 5 minutes in review of e-visit questionnaire, review and updating patient chart, medical decision making and response to patient.   Mia Milan Cody Jacklynn Dehaas, PA-C    

## 2023-02-20 NOTE — Progress Notes (Signed)

## 2023-03-07 ENCOUNTER — Ambulatory Visit: Payer: Self-pay | Admitting: Physician Assistant

## 2023-03-07 DIAGNOSIS — F902 Attention-deficit hyperactivity disorder, combined type: Secondary | ICD-10-CM

## 2023-03-27 ENCOUNTER — Other Ambulatory Visit: Payer: Self-pay | Admitting: Physician Assistant

## 2023-03-27 DIAGNOSIS — G43009 Migraine without aura, not intractable, without status migrainosus: Secondary | ICD-10-CM

## 2023-04-03 ENCOUNTER — Other Ambulatory Visit: Payer: Self-pay | Admitting: Physician Assistant

## 2023-04-03 DIAGNOSIS — F902 Attention-deficit hyperactivity disorder, combined type: Secondary | ICD-10-CM

## 2023-04-03 DIAGNOSIS — R41840 Attention and concentration deficit: Secondary | ICD-10-CM

## 2023-04-08 ENCOUNTER — Ambulatory Visit: Payer: Self-pay | Admitting: Physician Assistant

## 2023-04-16 ENCOUNTER — Telehealth (INDEPENDENT_AMBULATORY_CARE_PROVIDER_SITE_OTHER): Payer: Self-pay | Admitting: Physician Assistant

## 2023-04-16 DIAGNOSIS — R41840 Attention and concentration deficit: Secondary | ICD-10-CM

## 2023-04-16 DIAGNOSIS — F3281 Premenstrual dysphoric disorder: Secondary | ICD-10-CM

## 2023-04-16 DIAGNOSIS — Z8261 Family history of arthritis: Secondary | ICD-10-CM

## 2023-04-16 DIAGNOSIS — L409 Psoriasis, unspecified: Secondary | ICD-10-CM

## 2023-04-16 DIAGNOSIS — M2550 Pain in unspecified joint: Secondary | ICD-10-CM

## 2023-04-16 DIAGNOSIS — G43009 Migraine without aura, not intractable, without status migrainosus: Secondary | ICD-10-CM

## 2023-04-16 DIAGNOSIS — F410 Panic disorder [episodic paroxysmal anxiety] without agoraphobia: Secondary | ICD-10-CM

## 2023-04-16 DIAGNOSIS — R768 Other specified abnormal immunological findings in serum: Secondary | ICD-10-CM

## 2023-04-16 DIAGNOSIS — F902 Attention-deficit hyperactivity disorder, combined type: Secondary | ICD-10-CM

## 2023-04-16 MED ORDER — HYDROXYZINE PAMOATE 25 MG PO CAPS: 25.0000 mg | ORAL_CAPSULE | Freq: Three times a day (TID) | ORAL | 3 refills | Status: AC | PRN

## 2023-04-16 MED ORDER — ELETRIPTAN HYDROBROMIDE 40 MG PO TABS: ORAL_TABLET | ORAL | 5 refills | Status: DC

## 2023-04-16 MED ORDER — AMPHETAMINE-DEXTROAMPHET ER 20 MG PO CP24
20.0000 mg | ORAL_CAPSULE | ORAL | 0 refills | Status: DC
Start: 2023-04-16 — End: 2023-08-30

## 2023-04-16 MED ORDER — AMPHETAMINE-DEXTROAMPHET ER 20 MG PO CP24: 20.0000 mg | ORAL_CAPSULE | ORAL | 0 refills | Status: DC

## 2023-04-16 MED ORDER — AMPHETAMINE-DEXTROAMPHET ER 20 MG PO CP24
20.0000 mg | ORAL_CAPSULE | ORAL | 0 refills | Status: DC
Start: 2023-05-16 — End: 2023-09-03

## 2023-04-16 MED ORDER — CITALOPRAM HYDROBROMIDE 20 MG PO TABS: 40.0000 mg | ORAL_TABLET | Freq: Every evening | ORAL | 1 refills | Status: DC

## 2023-04-16 NOTE — Progress Notes (Addendum)
..Virtual Visit via video Note  I connected with Ellen Short on 04/22/23 at  1:20 PM EST by video and verified that I am speaking with the correct person using two identifiers.  Location: Patient: home Provider: clinic  .Marland KitchenParticipating in visit:  Patient: Ellen Short Provider: Tandy Gaw PA-C   I discussed the limitations, risks, security and privacy concerns of performing an evaluation and management service by telephone and the availability of in person appointments. I also discussed with the patient that there may be a patient responsible charge related to this service. The patient expressed understanding and agreed to proceed.   History of Present Illness: Pt is a 42 yo obese female with ADHD, Migraines, Polyarthralgia, Psoriasis who calls into the clinic for medication refills and to discuss worsening joint pain and stiffness.   She would like referral to rheumatologist. She has psoriasis and wonders if she could have psoriatic arthritis. She was on Talz before that helped psorasis but not joint pain. Right now she is not on anything for pain. She hurts all the time. She wakes up with stiff fingers and joints and at times they swell. Family hx of RA. The pain is getting so bad hard to stay active and keep moving. NSAIDs have not really helped; tried mobic and diclofenac.   Doing well with attention and focus. Continues to struggle with sleep. No concerns with mood.  .. Active Ambulatory Problems    Diagnosis Date Noted   Calcific tendinitis of right shoulder 10/27/2017   PMDD (premenstrual dysphoric disorder) 12/03/2017   Dermatitis of external ear 12/03/2017   External hemorrhoid 03/09/2018   Migraine without aura and without status migrainosus, not intractable 06/18/2018   Intertrigo 06/18/2018   Menstrual migraine without status migrainosus, not intractable 11/02/2018   Stress due to marital problems 01/19/2019   Panic disorder 01/19/2019   Family history of blood  clots 11/26/2019   History of attention deficit hyperactivity disorder (ADHD) 11/29/2019   Inattention 11/29/2019   Psoriasis 11/29/2019   Vaginal itching 01/07/2020   Family history of rheumatoid arthritis 05/29/2020   Polyarthralgia 05/29/2020   Joint stiffness 05/29/2020   Attention deficit hyperactivity disorder (ADHD), combined type 11/27/2020   Internal hemorrhoid 07/04/2021   Class 3 severe obesity due to excess calories without serious comorbidity with body mass index (BMI) of 40.0 to 44.9 in adult (HCC) 07/04/2021   Psoriatic arthritis (HCC) 08/03/2021   Hypertriglyceridemia 02/05/2022   Dyslipidemia 02/05/2022   Elevated liver enzymes 02/05/2022   No energy 09/04/2022   Resolved Ambulatory Problems    Diagnosis Date Noted   Supervision of normal pregnancy 08/29/2014   AMA (advanced maternal age) multigravida 35+ 09/06/2014   Previous cesarean delivery affecting pregnancy, antepartum 09/26/2014   [redacted] weeks gestation of pregnancy    Encounter for fetal anatomic survey    Advanced maternal age in multigravida    Recurrent UTI (urinary tract infection) complicating pregnancy 01/06/2015   Back pain affecting pregnancy in third trimester 01/09/2015   Term pregnancy delivered 04/12/2015   Past Medical History:  Diagnosis Date   Complication of anesthesia    H/O varicella    Infection    Migraine    Yeast infection       Observations/Objective: No acute distress Normal mood and appearance   Assessment and Plan: .Marland KitchenLayali was seen today for medication refill and referral.  Diagnoses and all orders for this visit:  Attention deficit hyperactivity disorder (ADHD), combined type -     amphetamine-dextroamphetamine (ADDERALL XR) 20  MG 24 hr capsule; Take 1 capsule (20 mg total) by mouth every morning. -     amphetamine-dextroamphetamine (ADDERALL XR) 20 MG 24 hr capsule; Take 1 capsule (20 mg total) by mouth every morning. -     amphetamine-dextroamphetamine (ADDERALL  XR) 20 MG 24 hr capsule; Take 1 capsule (20 mg total) by mouth every morning.  Inattention -     amphetamine-dextroamphetamine (ADDERALL XR) 20 MG 24 hr capsule; Take 1 capsule (20 mg total) by mouth every morning. -     amphetamine-dextroamphetamine (ADDERALL XR) 20 MG 24 hr capsule; Take 1 capsule (20 mg total) by mouth every morning. -     amphetamine-dextroamphetamine (ADDERALL XR) 20 MG 24 hr capsule; Take 1 capsule (20 mg total) by mouth every morning.  Panic disorder -     hydrOXYzine (VISTARIL) 25 MG capsule; Take 1 capsule (25 mg total) by mouth every 8 (eight) hours as needed for itching. -     citalopram (CELEXA) 20 MG tablet; Take 2 tablets (40 mg total) by mouth every evening.  PMDD (premenstrual dysphoric disorder) -     citalopram (CELEXA) 20 MG tablet; Take 2 tablets (40 mg total) by mouth every evening.  Migraine without aura and without status migrainosus, not intractable -     eletriptan (RELPAX) 40 MG tablet; TAKE 1 TABLET BY MOUTH AT THE ONSET OF HEADACHE; MAY REPEAT IN 2 HOURS IF NEEDED. MAX OF 2 TABLETS IN 24 HOURS  Polyarthralgia -     ANA,IFA RA Diag Pnl w/rflx Tit/Patn -     CYCLIC CITRUL PEPTIDE ANTIBODY, IGG/IGA -     Sed Rate (ESR) -     C-reactive protein -     CMP14+EGFR -     CBC w/Diff/Platelet -     TSH -     FANA Staining Patterns  Psoriasis -     ANA,IFA RA Diag Pnl w/rflx Tit/Patn -     CYCLIC CITRUL PEPTIDE ANTIBODY, IGG/IGA -     Sed Rate (ESR) -     C-reactive protein -     CMP14+EGFR -     CBC w/Diff/Platelet -     TSH -     FANA Staining Patterns  Positive ANA (antinuclear antibody) -     FANA Staining Patterns  Family history of rheumatoid arthritis   Refilled adderall for ADHD  Labs ordered to look for auto immune causes of joint pain and stiffness.  Will make referral once labs are back.  Failed mobic and diclofenac Trial of naproxen Sulfa allergy and cannot tolerate celebrex  Refilled celexa and hydroxyzine for mood and  sleep   Follow Up Instructions:    I discussed the assessment and treatment plan with the patient. The patient was provided an opportunity to ask questions and all were answered. The patient agreed with the plan and demonstrated an understanding of the instructions.   The patient was advised to call back or seek an in-person evaluation if the symptoms worsen or if the condition fails to improve as anticipated.  Tandy Gaw, PA-C

## 2023-04-19 LAB — CBC WITH DIFFERENTIAL/PLATELET
Basophils Absolute: 0.1 10*3/uL (ref 0.0–0.2)
Basos: 1 %
EOS (ABSOLUTE): 0.1 10*3/uL (ref 0.0–0.4)
Eos: 1 %
Hematocrit: 40.1 % (ref 34.0–46.6)
Hemoglobin: 13.6 g/dL (ref 11.1–15.9)
Immature Grans (Abs): 0 10*3/uL (ref 0.0–0.1)
Immature Granulocytes: 0 %
Lymphocytes Absolute: 2.5 10*3/uL (ref 0.7–3.1)
Lymphs: 42 %
MCH: 30.4 pg (ref 26.6–33.0)
MCHC: 33.9 g/dL (ref 31.5–35.7)
MCV: 90 fL (ref 79–97)
Monocytes Absolute: 0.4 10*3/uL (ref 0.1–0.9)
Monocytes: 7 %
Neutrophils Absolute: 3 10*3/uL (ref 1.4–7.0)
Neutrophils: 49 %
Platelets: 156 10*3/uL (ref 150–450)
RBC: 4.48 x10E6/uL (ref 3.77–5.28)
RDW: 12.8 % (ref 11.7–15.4)
WBC: 6 10*3/uL (ref 3.4–10.8)

## 2023-04-19 LAB — CMP14+EGFR
ALT: 22 [IU]/L (ref 0–32)
AST: 16 [IU]/L (ref 0–40)
Albumin: 4.2 g/dL (ref 3.9–4.9)
Alkaline Phosphatase: 75 [IU]/L (ref 44–121)
BUN/Creatinine Ratio: 16 (ref 9–23)
BUN: 12 mg/dL (ref 6–24)
Bilirubin Total: 0.3 mg/dL (ref 0.0–1.2)
CO2: 22 mmol/L (ref 20–29)
Calcium: 8.9 mg/dL (ref 8.7–10.2)
Chloride: 104 mmol/L (ref 96–106)
Creatinine, Ser: 0.77 mg/dL (ref 0.57–1.00)
Globulin, Total: 1.9 g/dL (ref 1.5–4.5)
Glucose: 91 mg/dL (ref 70–99)
Potassium: 4.5 mmol/L (ref 3.5–5.2)
Sodium: 140 mmol/L (ref 134–144)
Total Protein: 6.1 g/dL (ref 6.0–8.5)
eGFR: 97 mL/min/{1.73_m2} (ref >59)

## 2023-04-19 LAB — FANA STAINING PATTERNS: Speckled Pattern: 1:80 {titer}

## 2023-04-19 LAB — ANA,IFA RA DIAG PNL W/RFLX TIT/PATN
ANA Titer 1: POSITIVE — AB
Cyclic Citrullin Peptide Ab: 4 U (ref 0–19)
Rheumatoid fact SerPl-aCnc: <10 [IU]/mL (ref <14.0)

## 2023-04-19 LAB — SEDIMENTATION RATE: Sed Rate: 5 mm/h (ref 0–32)

## 2023-04-19 LAB — TSH: TSH: 2.01 u[IU]/mL (ref 0.450–4.500)

## 2023-04-19 LAB — C-REACTIVE PROTEIN: CRP: 2 mg/L (ref 0–10)

## 2023-04-21 ENCOUNTER — Encounter: Payer: Self-pay | Admitting: Physician Assistant

## 2023-04-21 ENCOUNTER — Other Ambulatory Visit: Payer: Self-pay | Admitting: Physician Assistant

## 2023-04-21 DIAGNOSIS — L409 Psoriasis, unspecified: Secondary | ICD-10-CM

## 2023-04-21 DIAGNOSIS — R768 Other specified abnormal immunological findings in serum: Secondary | ICD-10-CM

## 2023-04-21 DIAGNOSIS — M2550 Pain in unspecified joint: Secondary | ICD-10-CM

## 2023-04-21 NOTE — Progress Notes (Signed)
Ellen Short,   Inflammatory rates are normal.  No anemia.  Thyroid looks great.  ANA(antibodies are positive) but negative for Rheumatoid Arthritis. I will make referral to rheumatology.

## 2023-04-22 ENCOUNTER — Encounter: Payer: Self-pay | Admitting: Physician Assistant

## 2023-04-23 MED ORDER — NAPROXEN 500 MG PO TABS
500.0000 mg | ORAL_TABLET | Freq: Two times a day (BID) | ORAL | 1 refills | Status: DC
Start: 2023-04-23 — End: 2023-08-12

## 2023-04-23 MED ORDER — TRAMADOL HCL 50 MG PO TABS: 50.0000 mg | ORAL_TABLET | Freq: Four times a day (QID) | ORAL | 0 refills | Status: AC | PRN

## 2023-04-25 MED ORDER — GABAPENTIN 100 MG PO CAPS: ORAL_CAPSULE | ORAL | 0 refills | Status: DC

## 2023-04-25 NOTE — Addendum Note (Signed)
Addended byJomarie Longs on: 04/25/2023 11:07 AM   Modules accepted: Orders

## 2023-05-07 ENCOUNTER — Other Ambulatory Visit (INDEPENDENT_AMBULATORY_CARE_PROVIDER_SITE_OTHER): Payer: Self-pay

## 2023-05-07 ENCOUNTER — Ambulatory Visit (INDEPENDENT_AMBULATORY_CARE_PROVIDER_SITE_OTHER): Payer: Self-pay | Admitting: Sports Medicine

## 2023-05-07 ENCOUNTER — Ambulatory Visit: Payer: Self-pay

## 2023-05-07 DIAGNOSIS — M7532 Calcific tendinitis of left shoulder: Secondary | ICD-10-CM

## 2023-05-07 DIAGNOSIS — M7531 Calcific tendinitis of right shoulder: Secondary | ICD-10-CM

## 2023-05-07 MED ORDER — TRIAMCINOLONE ACETONIDE 40 MG/ML IJ SUSP
40.0000 mg | Freq: Once | INTRAMUSCULAR | Status: AC
  Administered 2023-05-07: 40 mg via INTRAMUSCULAR

## 2023-05-07 NOTE — Progress Notes (Signed)
    Procedures performed today:    Procedure: Real-time Ultrasound Guided injection of the left biceps sheath Device: Samsung HS60  Verbal informed consent obtained.  Time-out conducted.  Noted no overlying erythema, induration, or other signs of local infection.  Skin prepped in a sterile fashion.  Local anesthesia: Topical Ethyl chloride.  With sterile technique and under real time ultrasound guidance: Biceps sheath effusion noted, no calcific tendinopathy noted in the supraspinatus, 1 cc Kenalog 40, 1 cc lidocaine, 1 cc bupivacaine injected easily Completed without difficulty  Advised to call if fevers/chills, erythema, induration, drainage, or persistent bleeding.  Images permanently stored and available for review in PACS.  Impression: Technically successful ultrasound guided injection.  Independent interpretation of notes and tests performed by another provider:   None.  Brief History, Exam, Impression, and Recommendations:    Right shoulder calcific tendinopathy, left shoulder bicipital tendinopathy This is a very pleasant 42 year old female, she does have a chronic pain syndrome. We saw her about 5 years ago for right shoulder calcific tendinopathy of the supraspinatus and infraspinatus, we performed an ultrasound-guided barbotage that provided partial relief, the deposit was so large we referred her to surgery, operative intervention was not performed however Dr. August Saucer did perform 2 additional barbotage procedures and she improved. She still has some discomfort but full motion. Started having pain left shoulder localized mostly anteriorly. On exam she has very minimal impingement signs predominantly however she does have a positive speeds test consistent with biceps tendinopathy. We did an ultrasound-guided biceps sheath injection today. Ellen Short did have strongly concordant pain as we entered the biceps sheath. I really did not see calcific tendinopathy on the ultrasound  today. Adding x-rays, home physical therapy, return to see me in about 6 weeks, if she has persistent discomfort we will proceed with MRI. The follow-up will be a 30-minute visit in case we do decide to perform a barbotage. If I do see a lot of calcific tendinopathy on the x-rays we will consider barbotage however as I was unable to see much on the ultrasound is unlikely that we will be able to do this.    ____________________________________________ Ellen Short. Benjamin Stain, M.D., ABFM., CAQSM., AME. Primary Care and Sports Medicine [facility]  Adjunct Professor of Family Medicine  [employer]  Restaurant manager, fast food

## 2023-05-07 NOTE — Addendum Note (Signed)
Addended by: Carren Rang A on: 05/07/2023 02:54 PM   Modules accepted: Orders

## 2023-05-07 NOTE — Assessment & Plan Note (Addendum)
This is a very pleasant 42 year old female, she does have a chronic pain syndrome. We saw her about 5 years ago for right shoulder calcific tendinopathy of the supraspinatus and infraspinatus, we performed an ultrasound-guided barbotage that provided partial relief, the deposit was so large we referred her to surgery, operative intervention was not performed however Dr. August Saucer did perform 2 additional barbotage procedures and she improved. She still has some discomfort but full motion. Started having pain left shoulder localized mostly anteriorly. On exam she has very minimal impingement signs predominantly however she does have a positive speeds test consistent with biceps tendinopathy. We did an ultrasound-guided biceps sheath injection today. Ellen Short did have strongly concordant pain as we entered the biceps sheath. I really did not see calcific tendinopathy on the ultrasound today. Adding x-rays, home physical therapy, return to see me in about 6 weeks, if she has persistent discomfort we will proceed with MRI. The follow-up will be a 30-minute visit in case we do decide to perform a barbotage. If I do see a lot of calcific tendinopathy on the x-rays we will consider barbotage however as I was unable to see much on the ultrasound is unlikely that we will be able to do this.

## 2023-05-08 ENCOUNTER — Encounter: Payer: Self-pay | Admitting: Sports Medicine

## 2023-05-18 ENCOUNTER — Other Ambulatory Visit: Payer: Self-pay | Admitting: Physician Assistant

## 2023-05-22 ENCOUNTER — Other Ambulatory Visit: Payer: Self-pay | Admitting: Physician Assistant

## 2023-06-19 ENCOUNTER — Ambulatory Visit: Payer: Self-pay | Admitting: Sports Medicine

## 2023-06-21 IMAGING — MG MM DIGITAL SCREENING BILAT W/ TOMO AND CAD
8 series · 8 of 24 positions shown · non-contrast
Comparison: None.

CLINICAL DATA: Screening.

EXAM:
DIGITAL SCREENING BILATERAL MAMMOGRAM WITH TOMOSYNTHESIS AND CAD
TECHNIQUE: Bilateral screening digital craniocaudal and mediolateral oblique
mammograms were obtained. Bilateral screening digital breast
tomosynthesis was performed. The images were evaluated with
computer-aided detection.

[R CC synth-2D]
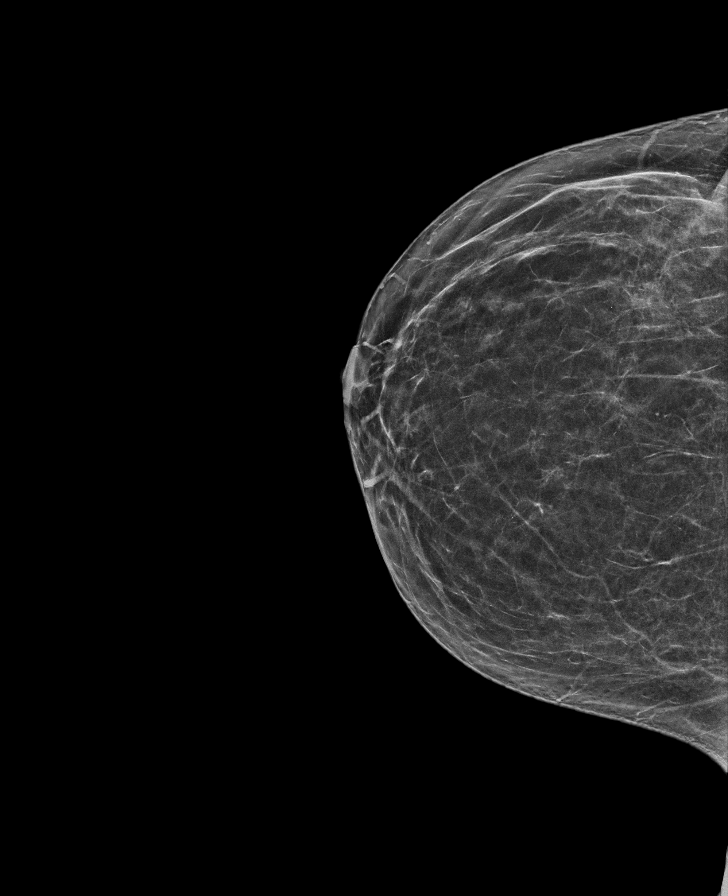

[R MLO synth-2D]
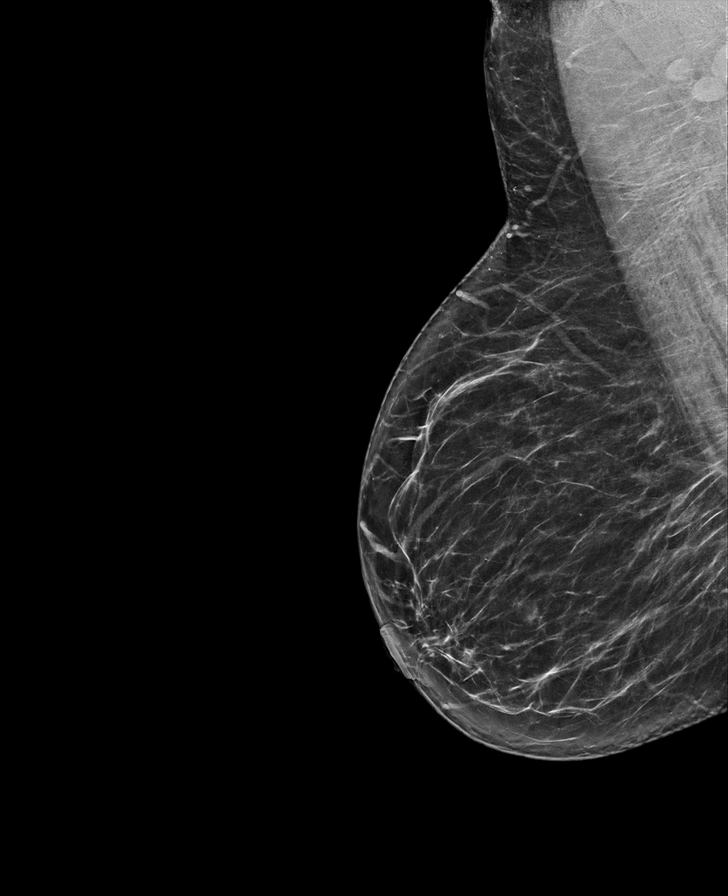

[L MLO synth-2D]
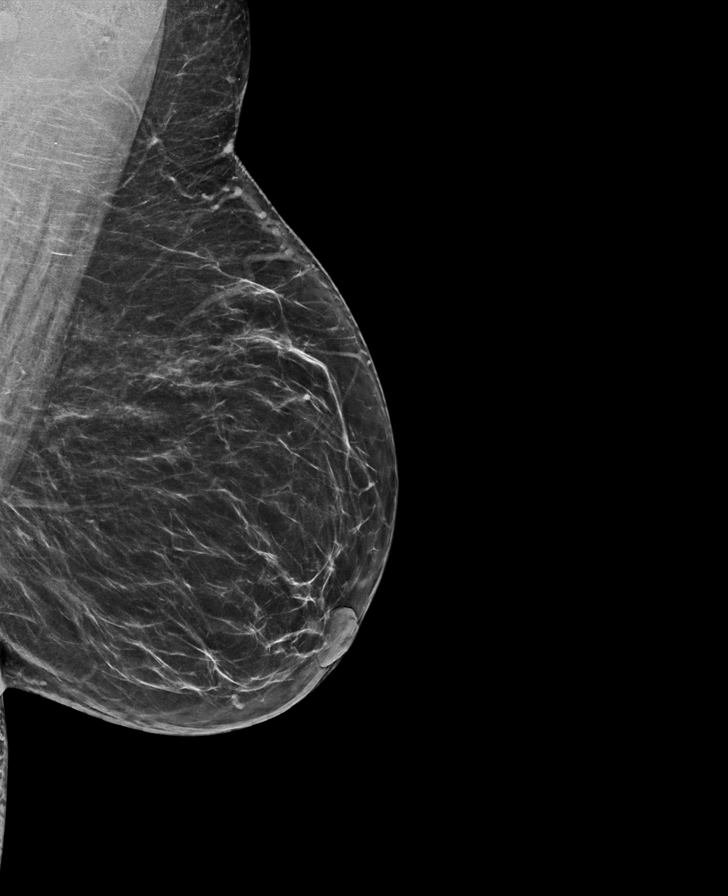

[L CC synth-2D]
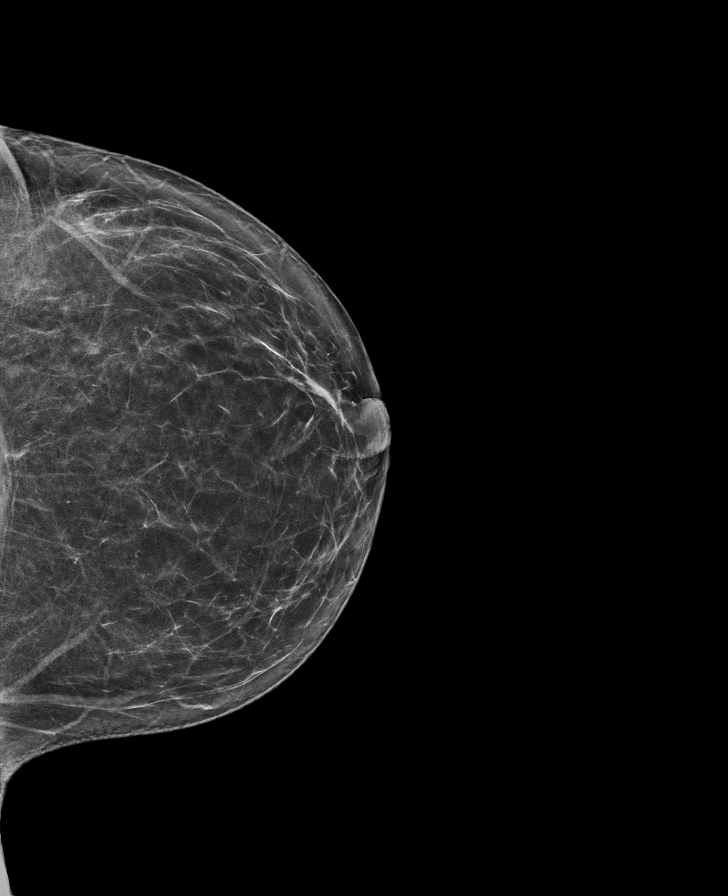

[L CC tomo · tomo slice 35/69.0]
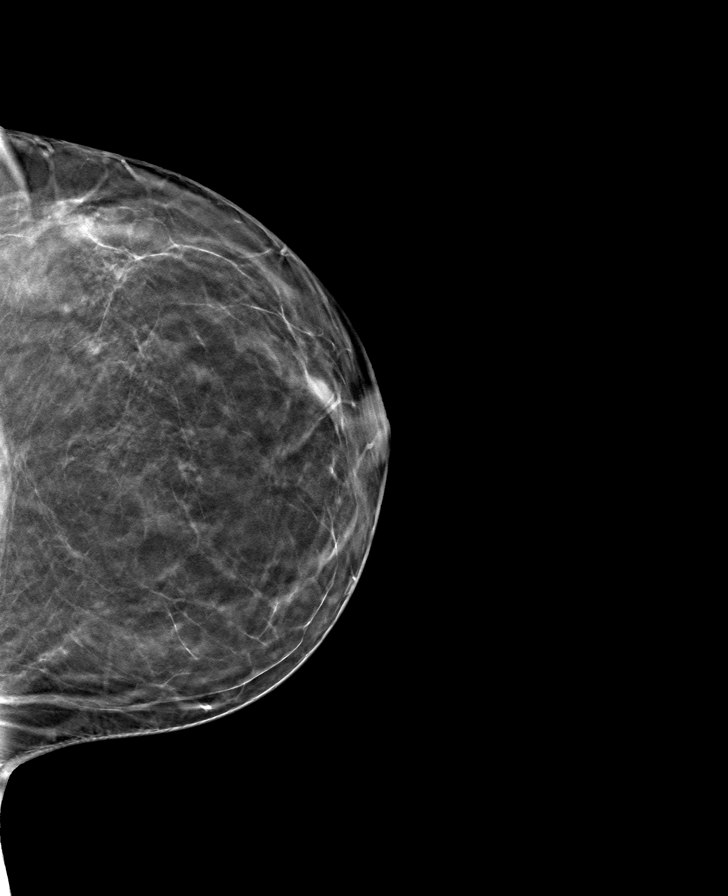

[L MLO tomo · tomo slice 37/74.0]
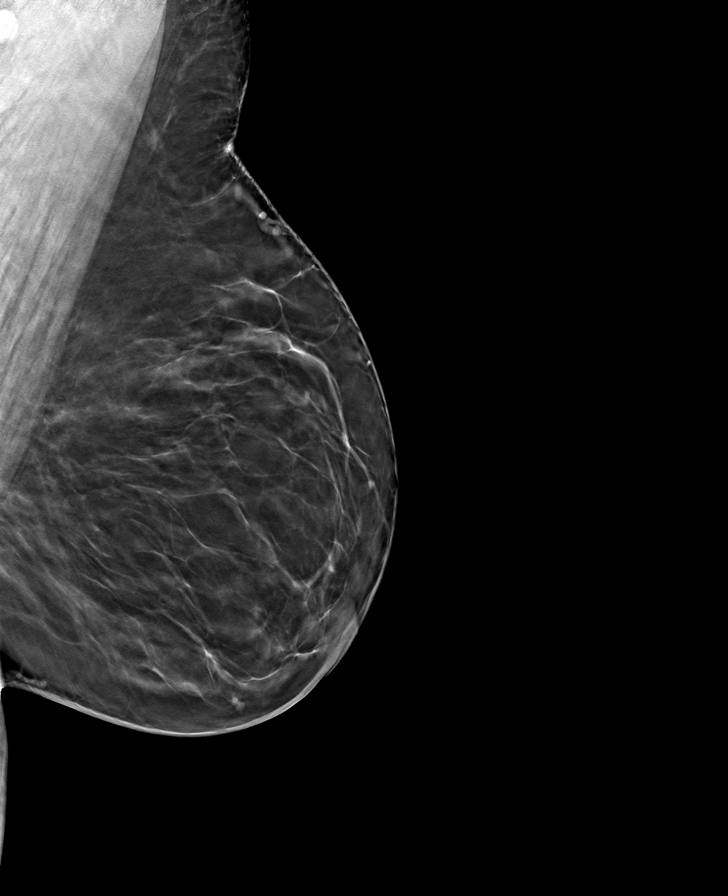

[R MLO tomo · tomo slice 37/74.0]
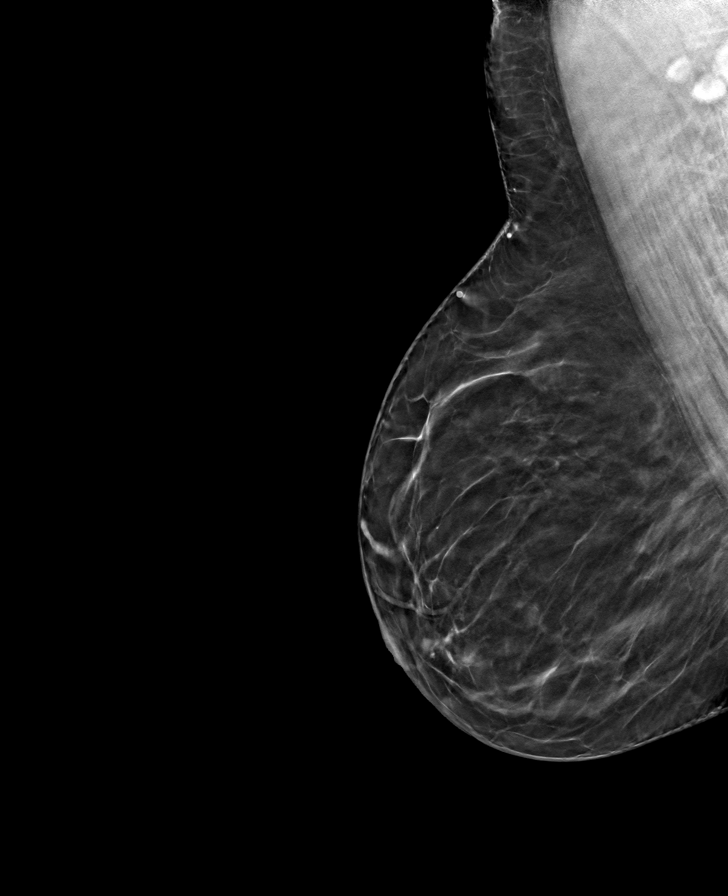

[R CC tomo · tomo slice 31/60.0]
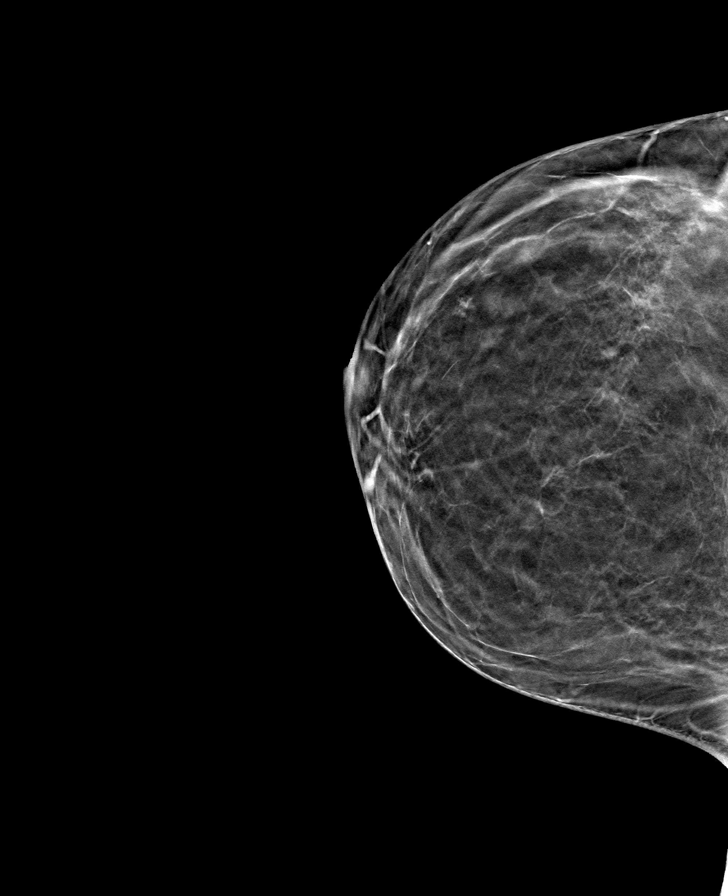

[8 of 24 positions shown; findings below may reference images not displayed]

ACR Breast Density Category b: There are scattered areas of
fibroglandular density.
FINDINGS: There are no findings suspicious for malignancy.
IMPRESSION: No mammographic evidence of malignancy. A result letter of this
screening mammogram will be mailed directly to the patient.

RECOMMENDATION:
Screening mammogram in one year. (Code:[id number]-[id number])

BI-RADS CATEGORY  1: Negative.

## 2023-06-23 ENCOUNTER — Other Ambulatory Visit: Payer: Self-pay | Admitting: Physician Assistant

## 2023-06-30 ENCOUNTER — Encounter: Payer: Self-pay | Admitting: Physician Assistant

## 2023-08-12 ENCOUNTER — Encounter: Payer: Self-pay | Admitting: Sports Medicine

## 2023-08-12 ENCOUNTER — Ambulatory Visit: Payer: Self-pay

## 2023-08-12 ENCOUNTER — Ambulatory Visit (INDEPENDENT_AMBULATORY_CARE_PROVIDER_SITE_OTHER): Payer: Self-pay | Admitting: Sports Medicine

## 2023-08-12 VITALS — BP 118/78 | HR 87 | Resp 20 | Ht 62.0 in | Wt 213.0 lb

## 2023-08-12 DIAGNOSIS — S86811A Strain of other muscle(s) and tendon(s) at lower leg level, right leg, initial encounter: Secondary | ICD-10-CM

## 2023-08-12 DIAGNOSIS — M7531 Calcific tendinitis of right shoulder: Secondary | ICD-10-CM

## 2023-08-12 DIAGNOSIS — M7532 Calcific tendinitis of left shoulder: Secondary | ICD-10-CM

## 2023-08-12 DIAGNOSIS — L4050 Arthropathic psoriasis, unspecified: Secondary | ICD-10-CM

## 2023-08-12 MED ORDER — OTEZLA 10 & 20 & 30 MG PO TBPK: ORAL_TABLET | ORAL | 0 refills | Status: DC

## 2023-08-12 MED ORDER — OTEZLA 30 MG PO TABS: 30.0000 mg | ORAL_TABLET | Freq: Two times a day (BID) | ORAL | 3 refills | Status: DC

## 2023-08-12 MED ORDER — MELOXICAM 15 MG PO TABS: ORAL_TABLET | ORAL | 3 refills | Status: DC

## 2023-08-12 NOTE — Progress Notes (Addendum)
    Procedures performed today:    None.  Independent interpretation of notes and tests performed by another provider:   None.  Brief History, Exam, Impression, and Recommendations:    Strain of right calf muscle Recent leg injury in [facility], was seen in the ED, diagnosed with a calf strain. She was given a wrap and a posterior slab splint. Today she has some tenderness at the gastrocnemius medial head musculotendinous junction. She has a negative Thompson's test. We removed her splint, she can do heel lifts, compression wrap, meloxicam, and return to see me in 2 weeks  Psoriatic arthritis (HCC) Pt dropped off assistance form for [employer], filled out and faxed. We also sent the prescriptions off to [facility].  Right shoulder calcific tendinopathy, left shoulder bicipital tendinopathy Please see prior notes, this 43 year old female has chronic pain, she has a history of right shoulder calcific tendinopathy, it required barbotage x 3, 1 with me and 2 with Dr. August Saucer, but ultimately resolved, then she started having pain to left shoulder, she did not respond to conservative treatment in the form of a biceps sheath injection. At this point we will proceed with MRI for consideration of arthroscopic intervention.     ____________________________________________ Ihor Austin. Benjamin Stain, M.D., ABFM., CAQSM., AME. Primary Care and Sports Medicine [facility]  Adjunct Professor of Family Medicine  [employer] [facility] [employer]  Restaurant manager, fast food

## 2023-08-12 NOTE — Assessment & Plan Note (Addendum)
 Please see prior notes, this 43 year old female has chronic pain, she has a history of right shoulder calcific tendinopathy, it required barbotage x 3, 1 with me and 2 with Dr. August Saucer, but ultimately resolved, then she started having pain to left shoulder, she did not respond to conservative treatment in the form of a biceps sheath injection. At this point we will proceed with MRI for consideration of arthroscopic intervention.

## 2023-08-12 NOTE — Addendum Note (Signed)
 Addended by: Monica Becton on: 08/12/2023 04:14 PM   Modules accepted: Orders

## 2023-08-12 NOTE — Assessment & Plan Note (Signed)
 Recent leg injury in [facility], was seen in the ED, diagnosed with a calf strain. She was given a wrap and a posterior slab splint. Today she has some tenderness at the gastrocnemius medial head musculotendinous junction. She has a negative Thompson's test. We removed her splint, she can do heel lifts, compression wrap, meloxicam, and return to see me in 2 weeks

## 2023-08-12 NOTE — Assessment & Plan Note (Addendum)
 Pt dropped off assistance form for [employer], filled out and faxed. We also sent the prescriptions off to [facility].

## 2023-08-17 ENCOUNTER — Ambulatory Visit: Payer: Self-pay

## 2023-08-17 DIAGNOSIS — G8929 Other chronic pain: Secondary | ICD-10-CM

## 2023-08-17 DIAGNOSIS — M25512 Pain in left shoulder: Secondary | ICD-10-CM

## 2023-08-17 DIAGNOSIS — M7532 Calcific tendinitis of left shoulder: Secondary | ICD-10-CM

## 2023-08-20 NOTE — Progress Notes (Signed)
 Office Visit Note  Patient: Ellen Short             Date of Birth: [birth year]           MRN: [id number]             PCP: Araceli Knight, PA-C Referring: Kita Perish Visit Date: 09/03/2023 Occupation: @GUAROCC @  Subjective:  Pain in joints and psoriasis  History of Present Illness: Ellen Short is a 43 y.o. female seen for the evaluation of joint pain and psoriasis.  According the patient her symptoms of joint pain started in high school with discomfort in her hands.  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  In 2015 she started having right shoulder joint pain at the time she was nursing her child.  She was evaluated by Dr. Sandy Crumb who did x-rays and found calcific tendinitis.  She had ultrasound-guided injections without much relief and then she was referred to Dr. Rozelle Corning.  We did MRI of her shoulder and gave her an injection which helped much relief.  Right rotator cuff tear repair was advised but patient declined.  The pain gradually improved but did not completely resolve.  In January 2025 she started having pain in her left shoulder for which she was seen by Dr. Sandy Crumb again.  He gave injections x 2 and then ordered MRI.  The results are pending.  She complains of occasional discomfort in her hips and cervical spine.  None of the other joints are painful.  She has noticed swelling in her hands in the past but none in the last 1 year.  She states she has had recurrent plantar fasciitis for the last 14 years.  The last episode was about 3 years ago.  She denies any history of Achilles tendinitis or uveitis.  She has had psoriasis since age 61.  She had skin biopsy by the dermatologist.  She reports psoriasis in her ears, elbows, knees, medical area  natal cleft.  She states she was started on Taltz by her dermatologist in 2022 which she took for 1 year and then discontinued as she did not noticed any improvement in her joint pain although her skin completely cleared up.  She states her psoriasis is not as bad currently and she has some rash in her ear canal and over her elbows.  She states for her joint pain she has been given gabapentin, meloxicam and diclofenac in the past without much relief.  Dr. Sandy Crumb discussed possible use of Otezla and applied for Otezla. She is married, left-handed currently unemployed.  She is gravida 8, para 5, miscarriages 3.  Birth control method is vasectomy.  She does not drink any alcohol and is non-smoker.  She enjoys crafts, dancing and goes for walking.  There is family history of rheumatoid arthritis in her father.  There is no family history of psoriasis.    Activities of Daily Living:  Patient reports morning stiffness for 45-60 minutes.   Patient Reports nocturnal pain.  Difficulty dressing/grooming: Reports Difficulty climbing stairs: Denies Difficulty getting out of chair: Denies Difficulty using hands for taps, buttons, cutlery, and/or writing: Reports  Review of Systems  Constitutional:  Positive for fatigue.  HENT:  Positive for mouth dryness.  Negative for mouth sores.   Eyes:  Positive for dryness.  Respiratory:  Negative for shortness of breath.   Cardiovascular:  Negative for chest pain and palpitations.  Gastrointestinal:  Positive for constipation and diarrhea. Negative for blood in stool.  Endocrine: Negative for increased urination.  Genitourinary:  Negative for involuntary urination.  Musculoskeletal:  Positive for joint pain, joint pain, joint swelling, myalgias, muscle weakness, morning stiffness, muscle tenderness and myalgias. Negative for gait problem.  Skin:  Negative for color change, rash, hair loss and sensitivity to sunlight.  Allergic/Immunologic: Negative for  susceptible to infections.  Neurological:  Positive for headaches. Negative for dizziness.  Hematological:  Negative for swollen glands.  Psychiatric/Behavioral:  Positive for sleep disturbance. Negative for depressed mood. The patient is not nervous/anxious.     PMFS History:  Patient Active Problem List   Diagnosis Date Noted   Strain of right calf muscle 08/12/2023   No energy 09/04/2022   Hypertriglyceridemia 02/05/2022   Dyslipidemia 02/05/2022   Elevated liver enzymes 02/05/2022   Psoriatic arthritis (HCC) 08/03/2021   Internal hemorrhoid 07/04/2021   Class 3 severe obesity due to excess calories without serious comorbidity with body mass index (BMI) of 40.0 to 44.9 in adult [facility]) 07/04/2021   Attention deficit hyperactivity disorder (ADHD), combined type 11/27/2020   Family history of rheumatoid arthritis 05/29/2020   Polyarthralgia 05/29/2020   Joint stiffness 05/29/2020   Vaginal itching 01/07/2020   History of attention deficit hyperactivity disorder (ADHD) 11/29/2019   Inattention 11/29/2019   Psoriasis 11/29/2019   Family history of blood clots 11/26/2019   Stress due to marital problems 01/19/2019   Panic disorder 01/19/2019   Menstrual migraine without status migrainosus, not intractable 11/02/2018   Migraine without aura and without status migrainosus, not intractable 06/18/2018   Intertrigo 06/18/2018   External hemorrhoid 03/09/2018   PMDD (premenstrual dysphoric disorder) 12/03/2017   Dermatitis of external ear 12/03/2017   Right shoulder calcific tendinopathy, left shoulder bicipital tendinopathy 10/27/2017    Past Medical History:  Diagnosis Date   Complication of anesthesia    ITCHING   H/O varicella    Infection    UTI WITH PREGNANCY   Migraine    MIGRAINES   Yeast infection     Family History  Problem Relation Age of Onset   Rheum arthritis Father    Miscarriages / Stillbirths Maternal Grandmother    Parkinsonism Maternal Grandmother    Birth  defects Son        HAS ONLY ONE KIDNEY   Past Surgical History:  Procedure Laterality Date   CESAREAN SECTION     c/s times 4    CESAREAN SECTION N/A 04/12/2015   Procedure: CESAREAN SECTION;  Surgeon: Granville Layer, MD;  Location: [facility];  Service: Obstetrics;  Laterality: N/A;   INTRAUTERINE DEVICE INSERTION     Paragard   TONSILLECTOMY  AGE 85   WISDOM TOOTH EXTRACTION  AGE 53   Social History   Social History Narrative   Not on file   Immunization History  Administered Date(s) Administered   Influenza,inj,Quad PF,6+ Mos 01/30/2015, 02/25/2018, 07/02/2021, 02/04/2022   Tdap 01/30/2015     Objective: Vital Signs: BP 123/84 (BP Location: Right Arm, Patient Position: Sitting, Cuff Size: Normal)   Pulse 87   Resp 14   Ht 5\' 2"  (1.575 m)   Wt 214 lb (97.1 kg)   LMP 08/13/2023   BMI 39.14 kg/m    Physical Exam Vitals and nursing note reviewed.  Constitutional:      Appearance: She is well-developed.  HENT:     Head: Normocephalic and atraumatic.  Eyes:     Conjunctiva/sclera: Conjunctivae normal.  Cardiovascular:     Rate and Rhythm: Normal rate and regular rhythm.     Heart sounds: Normal heart sounds.  Pulmonary:     Effort: Pulmonary effort is normal.     Breath sounds: Normal breath sounds.  Abdominal:     General: Bowel sounds are normal.     Palpations: Abdomen is soft.  Musculoskeletal:     Cervical back: Normal range of motion.  Lymphadenopathy:     Cervical: No cervical adenopathy.  Skin:    General: Skin is warm and dry.     Capillary Refill: Capillary refill takes less than 2 seconds.  Neurological:     Mental Status: She is alert and oriented to person, place, and time.  Psychiatric:        Behavior: Behavior normal.      Musculoskeletal Exam: Cervical, thoracic and lumbar spine were in good range of motion.  She had tenderness over bilateral SI joints.  Right shoulder joint was in full range of motion.  Left shoulder joint abduction and  forward flexion was about 100 degrees with limited internal rotation.  Elbow joints were in good range of motion.  Wrist joints and MCPs and PIPs were in good range of motion with no synovitis.  Hip joints and knee joints with good range of motion without any warmth swelling or effusion.  There was no synovitis or tenderness over ankles or MTPs.  There was no Achilles tendinitis or plantar fasciitis.  CDAI Exam: CDAI Score: -- Patient Global: --; Provider Global: -- Swollen: --; Tender: -- Joint Exam 09/03/2023   No joint exam has been documented for this visit   There is currently no information documented on the homunculus. Go to the Rheumatology activity and complete the homunculus joint exam.  Investigation: No additional findings.  Imaging: US  LIMITED JOINT SPACE STRUCTURES UP LEFT Result Date: 08/26/2023 Procedure: Real-time Ultrasound Guided injection of the left shoulder subcoracoid space and intra-articular Device: Samsung HS60 Verbal informed consent obtained. Time-out conducted. Noted no overlying erythema, induration, or other signs of local infection. Skin prepped in a sterile fashion. Local anesthesia: Topical Ethyl chloride. With sterile technique and under real time ultrasound guidance: Using a 22-gauge spinal needle I injected medication into the subcoracoid space just superficial to the subscapularis, I then proceeded through the subscapularis into the intra-articular space and placed additional medicine for a total of 1 cc Kenalog 40, 2 cc lidocaine, 2 cc bupivacaine. Completed without difficulty Advised to call if fevers/chills, erythema, induration, drainage, or persistent bleeding. Images permanently stored and available for review in PACS. Impression: Technically successful ultrasound guided injection.   DG Tibia/Fibula Right Result Date: 08/21/2023 CLINICAL DATA:  Posterior leg pain.  Strain of right calf muscle. EXAM: RIGHT TIBIA AND FIBULA - 2 VIEW COMPARISON:  None  Available. FINDINGS: There is no evidence of fracture or other focal bone lesions. The cortical margins of the tibia and fibula are intact. Knee and ankle alignment are maintained. Question of mild soft tissue edema posteriorly. No soft tissue gas or radiopaque foreign body. IMPRESSION: Question of mild soft tissue edema posteriorly. No osseous abnormality of the lower leg. Electronically Signed   By: Chadwick Colonel M.D.   On: 08/21/2023 20:39    Recent Labs: Lab Results  Component Value Date   WBC 6.0 04/16/2023  HGB 13.6 04/16/2023   PLT 156 04/16/2023   NA 140 04/16/2023   K 4.5 04/16/2023   CL 104 04/16/2023   CO2 22 04/16/2023   GLUCOSE 91 04/16/2023   BUN 12 04/16/2023   CREATININE 0.77 04/16/2023   BILITOT 0.3 04/16/2023   ALKPHOS 75 04/16/2023   AST 16 04/16/2023   ALT 22 04/16/2023   PROT 6.1 04/16/2023   ALBUMIN 4.2 04/16/2023   CALCIUM 8.9 04/16/2023   GFRAA >90 08/18/2014   April 16, 2023 ANA 1: 80 NS, RF negative, anti-CCP negative, ESR 5, CRP 2, TSH normal  Speciality Comments: No specialty comments available.  Procedures:  No procedures performed Allergies: Sulfa antibiotics, Amoxicillin, Erythromycin, Penicillins, and Topamax [topiramate]   Assessment / Plan:     Visit Diagnoses: Psoriatic arthritis (HCC)-patient gives history of pain and discomfort in her cervical spine, bilateral shoulders, bilateral hands and her SI joints.  She gives history of chronic SI joint discomfort.  She has left frozen shoulder.  MRI results are pending.  She gives history of recurrent plantar fasciitis for the last 14 years.  The last episode was 3 years ago.  No synovitis was noted on the examination today.  Patient states that she has had swollen fingers in the past.  She had no response to Delford Felling which was started in June 2022 and discontinued after 1 year.  She had no benefit from meloxicam, diclofenac and gabapentin.  Patient is interested in trying some of the medication.  She  states Dr. Sandy Crumb applied for Otezla but it was not approved.  I discussed with the patient that I did not see any synovitis or dactylitis.  However, she has been having discomfort in multiple joints and also had plantar fasciitis.  I discussed the option of Skyrizi.  Psoriasis-psoriasis patches were noted in the ear canals and over the elbows.  Patient states prior to starting Taltz she had psoriasis patches in her ear canals, elbows, knees, umbilical region and natal cleft.  High risk medication use-Taltz subcutaneous from June 2022 until June 2023 and then discontinued.  She could not take methotrexate due to elevated LFTs.  Birth control method is vasectomy.  Positive ANA-ANA is low titer positive and not significant.  She has no clinical features of lupus.  No further testing is needed.  Elevated LFTs-patient states LFTs became elevated on NSAIDs and NSAIDs had to be stopped.  Right shoulder calcific tendinopathy, left shoulder bicipital tendinopathy - Treated by Dr. Rozelle Corning and Dr. Sandy Crumb.  She had good range of motion of her right shoulder joint today.  Left shoulder joint had limited forward flexion and abduction.  MRI results are pending.  Pain in both hands -she complains of pain and discomfort in the bilateral hands.  No synovitis or dactylitis was noted.  Nails could not be examined as she had nail polish.  Plan: XR Hand 2 View Right, XR Hand 2 View Left.  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  Strain of right calf muscle -she had recent episode of right calf strain in 03/25.  She was evaluated by Dr. Sandy Crumb and the symptoms improved.  Pain in both feet -she has intermittent discomfort in her feet.  Plan: XR Foot 2 Views Right, XR Foot 2 Views Left.  X-rays of bilateral feet were unremarkable except for bilateral calcaneal spurs.  Plantar fasciitis, bilateral-she gives history of recurrent plantar  fasciitis for the last 14 years.  She had no tenderness on the examination today.  Chronic SI joint pain -she gives history of chronic discomfort in her SI joints.  She gives history of significant morning stiffness.  She had tenderness on palpation over bilateral SI joints.  Plan: XR Pelvis 1-2 Views.  No SI joint narrowing was noted.  Mild bilateral SI joint sclerosis was noted.  Other medical problems are listed as follows:  Dyslipidemia  Internal hemorrhoid  Attention deficit hyperactivity disorder (ADHD), combined type  Panic disorder  Migraine without aura and without status migrainosus, not intractable  Class 3 severe obesity due to excess calories without serious comorbidity with body mass index (BMI) of 40.0 to 44.9 in adult [facility])  Family history of rheumatoid arthritis-father  Orders: Orders Placed This Encounter  Procedures   XR Hand 2 View Right   XR Hand 2 View Left   XR Foot 2 Views Right   XR Foot 2 Views Left   XR Pelvis 1-2 Views   No orders of the defined types were placed in this encounter.   Face-to-face time spent with patient was over 50 minutes. Greater than 50% of time was spent in counseling and coordination of care.  Follow-Up Instructions: Return for Psoriatic arthritis.   Nicholas Bari, MD  Note - This record has been created using Animal nutritionist.  Chart creation errors have been sought, but may not always  have been located. Such creation errors do not reflect on  the standard of medical care.

## 2023-08-26 ENCOUNTER — Ambulatory Visit (INDEPENDENT_AMBULATORY_CARE_PROVIDER_SITE_OTHER): Payer: Self-pay | Admitting: Sports Medicine

## 2023-08-26 ENCOUNTER — Other Ambulatory Visit (INDEPENDENT_AMBULATORY_CARE_PROVIDER_SITE_OTHER): Payer: Self-pay

## 2023-08-26 ENCOUNTER — Telehealth: Payer: Self-pay

## 2023-08-26 ENCOUNTER — Encounter: Payer: Self-pay | Admitting: Sports Medicine

## 2023-08-26 ENCOUNTER — Other Ambulatory Visit (HOSPITAL_COMMUNITY): Payer: Self-pay

## 2023-08-26 DIAGNOSIS — M7532 Calcific tendinitis of left shoulder: Secondary | ICD-10-CM

## 2023-08-26 DIAGNOSIS — M7531 Calcific tendinitis of right shoulder: Secondary | ICD-10-CM

## 2023-08-26 DIAGNOSIS — S86811A Strain of other muscle(s) and tendon(s) at lower leg level, right leg, initial encounter: Secondary | ICD-10-CM

## 2023-08-26 MED ORDER — TRIAMCINOLONE ACETONIDE 40 MG/ML IJ SUSP
40.0000 mg | Freq: Once | INTRAMUSCULAR | Status: AC
  Administered 2023-08-26: 40 mg via INTRAMUSCULAR

## 2023-08-26 NOTE — Telephone Encounter (Signed)
 Prior Authorization form/request asks a question that requires your assistance. Please see the question below and advise accordingly. The PA will not be submitted until the necessary information is received.

## 2023-08-26 NOTE — Progress Notes (Signed)
    Procedures performed today:    Procedure: Real-time Ultrasound Guided injection of the left shoulder subcoracoid space and intra-articular Device: Samsung HS60  Verbal informed consent obtained.  Time-out conducted.  Noted no overlying erythema, induration, or other signs of local infection.  Skin prepped in a sterile fashion.  Local anesthesia: Topical Ethyl chloride.  With sterile technique and under real time ultrasound guidance: Using a 22-gauge spinal needle I injected medication into the subcoracoid space just superficial to the subscapularis, I then proceeded through the subscapularis into the intra-articular space and placed additional medicine for a total of 1 cc Kenalog 40, 2 cc lidocaine, 2 cc bupivacaine. Completed without difficulty  Advised to call if fevers/chills, erythema, induration, drainage, or persistent bleeding.  Images permanently stored and available for review in PACS.  Impression: Technically successful ultrasound guided injection.  Independent interpretation of notes and tests performed by another provider:   None.  Brief History, Exam, Impression, and Recommendations:    Strain of right calf muscle Resolved, no further intervention needed.  Right shoulder calcific tendinopathy, left shoulder bicipital tendinopathy History of right shoulder calcific tendinopathy, required barbotage x 3, 1 with me in 2 with Dr. August Saucer but ultimately resolved, then started having pain left shoulder, more bicipital symptoms previously, biceps sheath injection did not help, we obtained an MRI, there official results are not back yet but on my personal review there is some edema in the subcoracoid space. Today we did a subcoracoid injection, I also placed some medicine through the subscapularis into the joint space itself. Home conditioning given, return in 6 weeks.    ____________________________________________ Ihor Austin. Benjamin Stain, M.D., ABFM., CAQSM., AME. Primary  Care and Sports Medicine [facility]  Adjunct Professor of Family Medicine  [place] [place] [employer]  Restaurant manager, fast food

## 2023-08-26 NOTE — Assessment & Plan Note (Signed)
Resolved, no further intervention needed. 

## 2023-08-26 NOTE — Assessment & Plan Note (Signed)
 History of right shoulder calcific tendinopathy, required barbotage x 3, 1 with me in 2 with Dr. August Saucer but ultimately resolved, then started having pain left shoulder, more bicipital symptoms previously, biceps sheath injection did not help, we obtained an MRI, there official results are not back yet but on my personal review there is some edema in the subcoracoid space. Today we did a subcoracoid injection, I also placed some medicine through the subscapularis into the joint space itself. Home conditioning given, return in 6 weeks.

## 2023-08-30 ENCOUNTER — Other Ambulatory Visit: Payer: Self-pay | Admitting: Physician Assistant

## 2023-08-30 DIAGNOSIS — F902 Attention-deficit hyperactivity disorder, combined type: Secondary | ICD-10-CM

## 2023-08-30 DIAGNOSIS — R41840 Attention and concentration deficit: Secondary | ICD-10-CM

## 2023-09-01 MED ORDER — AMPHETAMINE-DEXTROAMPHET ER 20 MG PO CP24
20.0000 mg | ORAL_CAPSULE | ORAL | 0 refills | Status: DC
Start: 2023-09-01 — End: 2023-10-24

## 2023-09-03 ENCOUNTER — Ambulatory Visit (INDEPENDENT_AMBULATORY_CARE_PROVIDER_SITE_OTHER): Payer: Self-pay

## 2023-09-03 ENCOUNTER — Telehealth: Payer: Self-pay

## 2023-09-03 ENCOUNTER — Ambulatory Visit: Payer: Self-pay

## 2023-09-03 ENCOUNTER — Encounter: Payer: Self-pay | Admitting: Rheumatology

## 2023-09-03 ENCOUNTER — Other Ambulatory Visit (HOSPITAL_COMMUNITY)
Admission: AD | Admit: 2023-09-03 | Discharge: 2023-09-03 | Disposition: A | Discharge status: Home / Self Care | Payer: Self-pay | Source: Ambulatory Visit | Attending: Rheumatology | Admitting: Rheumatology

## 2023-09-03 ENCOUNTER — Ambulatory Visit: Payer: Self-pay | Attending: Rheumatology | Admitting: Rheumatology

## 2023-09-03 VITALS — BP 123/84 | HR 87 | Resp 14 | Ht 62.0 in | Wt 214.0 lb

## 2023-09-03 DIAGNOSIS — M7531 Calcific tendinitis of right shoulder: Secondary | ICD-10-CM

## 2023-09-03 DIAGNOSIS — Z79899 Other long term (current) drug therapy: Secondary | ICD-10-CM

## 2023-09-03 DIAGNOSIS — E66813 Obesity, class 3: Secondary | ICD-10-CM

## 2023-09-03 DIAGNOSIS — S86811A Strain of other muscle(s) and tendon(s) at lower leg level, right leg, initial encounter: Secondary | ICD-10-CM

## 2023-09-03 DIAGNOSIS — G8929 Other chronic pain: Secondary | ICD-10-CM

## 2023-09-03 DIAGNOSIS — F410 Panic disorder [episodic paroxysmal anxiety] without agoraphobia: Secondary | ICD-10-CM

## 2023-09-03 DIAGNOSIS — M79671 Pain in right foot: Secondary | ICD-10-CM

## 2023-09-03 DIAGNOSIS — M79642 Pain in left hand: Secondary | ICD-10-CM

## 2023-09-03 DIAGNOSIS — Z111 Encounter for screening for respiratory tuberculosis: Secondary | ICD-10-CM

## 2023-09-03 DIAGNOSIS — E785 Hyperlipidemia, unspecified: Secondary | ICD-10-CM

## 2023-09-03 DIAGNOSIS — F902 Attention-deficit hyperactivity disorder, combined type: Secondary | ICD-10-CM

## 2023-09-03 DIAGNOSIS — K648 Other hemorrhoids: Secondary | ICD-10-CM

## 2023-09-03 DIAGNOSIS — R748 Abnormal levels of other serum enzymes: Secondary | ICD-10-CM

## 2023-09-03 DIAGNOSIS — F3281 Premenstrual dysphoric disorder: Secondary | ICD-10-CM

## 2023-09-03 DIAGNOSIS — M79641 Pain in right hand: Secondary | ICD-10-CM

## 2023-09-03 DIAGNOSIS — R768 Other specified abnormal immunological findings in serum: Secondary | ICD-10-CM

## 2023-09-03 DIAGNOSIS — R7989 Other specified abnormal findings of blood chemistry: Secondary | ICD-10-CM

## 2023-09-03 DIAGNOSIS — M533 Sacrococcygeal disorders, not elsewhere classified: Secondary | ICD-10-CM

## 2023-09-03 DIAGNOSIS — M79672 Pain in left foot: Secondary | ICD-10-CM

## 2023-09-03 DIAGNOSIS — M2550 Pain in unspecified joint: Secondary | ICD-10-CM

## 2023-09-03 DIAGNOSIS — L409 Psoriasis, unspecified: Secondary | ICD-10-CM

## 2023-09-03 DIAGNOSIS — Z6841 Body Mass Index (BMI) 40.0 and over, adult: Secondary | ICD-10-CM

## 2023-09-03 DIAGNOSIS — L4050 Arthropathic psoriasis, unspecified: Secondary | ICD-10-CM

## 2023-09-03 DIAGNOSIS — G43009 Migraine without aura, not intractable, without status migrainosus: Secondary | ICD-10-CM

## 2023-09-03 DIAGNOSIS — Z8261 Family history of arthritis: Secondary | ICD-10-CM

## 2023-09-03 DIAGNOSIS — M722 Plantar fascial fibromatosis: Secondary | ICD-10-CM

## 2023-09-03 DIAGNOSIS — M7532 Calcific tendinitis of left shoulder: Secondary | ICD-10-CM

## 2023-09-03 LAB — CBC WITH DIFFERENTIAL/PLATELET
Abs Immature Granulocytes: 0.04 10*3/uL (ref 0.00–0.07)
Basophils Absolute: 0.1 10*3/uL (ref 0.0–0.1)
Basophils Relative: 1 %
Eosinophils Absolute: 0.1 10*3/uL (ref 0.0–0.5)
Eosinophils Relative: 1 %
HCT: 41.1 % (ref 36.0–46.0)
Hemoglobin: 14 g/dL (ref 12.0–15.0)
Immature Granulocytes: 0 %
Lymphocytes Relative: 29 %
Lymphs Abs: 3 10*3/uL (ref 0.7–4.0)
MCH: 29.9 pg (ref 26.0–34.0)
MCHC: 34.1 g/dL (ref 30.0–36.0)
MCV: 87.8 fL (ref 80.0–100.0)
Monocytes Absolute: 0.7 10*3/uL (ref 0.1–1.0)
Monocytes Relative: 7 %
Neutro Abs: 6.4 10*3/uL (ref 1.7–7.7)
Neutrophils Relative %: 62 %
Platelets: 219 10*3/uL (ref 150–400)
RBC: 4.68 MIL/uL (ref 3.87–5.11)
RDW: 13.6 % (ref 11.5–15.5)
WBC: 10.3 10*3/uL (ref 4.0–10.5)
nRBC: 0 % (ref 0.0–0.2)

## 2023-09-03 LAB — COMPREHENSIVE METABOLIC PANEL WITH GFR
ALT: 16 U/L (ref 0–44)
AST: 15 U/L (ref 15–41)
Albumin: 3.8 g/dL (ref 3.5–5.0)
Alkaline Phosphatase: 43 U/L (ref 38–126)
Anion gap: 9 (ref 5–15)
BUN: 9 mg/dL (ref 6–20)
CO2: 21 mmol/L — ABNORMAL LOW (ref 22–32)
Calcium: 8.8 mg/dL — ABNORMAL LOW (ref 8.9–10.3)
Chloride: 106 mmol/L (ref 98–111)
Creatinine, Ser: 0.79 mg/dL (ref 0.44–1.00)
GFR, Estimated: >60 mL/min (ref >60)
Glucose, Bld: 94 mg/dL (ref 70–99)
Potassium: 3.9 mmol/L (ref 3.5–5.1)
Sodium: 136 mmol/L (ref 135–145)
Total Bilirubin: 0.5 mg/dL (ref 0.0–1.2)
Total Protein: 6.3 g/dL — ABNORMAL LOW (ref 6.5–8.1)

## 2023-09-03 NOTE — Progress Notes (Signed)
 Pharmacy Note  Subjective: Patient presents today to [facility] for follow up office visit.  Patient seen by the pharmacist for counseling on Skyrizi for psoriatic arthritis and plaque psoriasis.  Prior therapy includes: Taltz (2022-2023) inefficacious. NSAIDs caused LFTs   Objective:  CBC    Component Value Date/Time   WBC 6.0 04/16/2023 1413   WBC 8.2 09/04/2022 1513   RBC 4.48 04/16/2023 1413   RBC 4.67 09/04/2022 1513   HGB 13.6 04/16/2023 1413   HCT 40.1 04/16/2023 1413   PLT 156 04/16/2023 1413   MCV 90 04/16/2023 1413   MCH 30.4 04/16/2023 1413   MCH 30.4 09/04/2022 1513   MCHC 33.9 04/16/2023 1413   MCHC 34.2 09/04/2022 1513   RDW 12.8 04/16/2023 1413   LYMPHSABS 2.5 04/16/2023 1413   MONOABS 0.5 09/26/2014 1157   EOSABS 0.1 04/16/2023 1413   BASOSABS 0.1 04/16/2023 1413     CMP     Component Value Date/Time   NA 140 04/16/2023 1413   K 4.5 04/16/2023 1413   CL 104 04/16/2023 1413   CO2 22 04/16/2023 1413   GLUCOSE 91 04/16/2023 1413   GLUCOSE 98 09/04/2022 1513   BUN 12 04/16/2023 1413   CREATININE 0.77 04/16/2023 1413   CREATININE 0.83 09/04/2022 1513   CALCIUM 8.9 04/16/2023 1413   PROT 6.1 04/16/2023 1413   ALBUMIN 4.2 04/16/2023 1413   AST 16 04/16/2023 1413   ALT 22 04/16/2023 1413   ALKPHOS 75 04/16/2023 1413   BILITOT 0.3 04/16/2023 1413   GFRNONAA >90 08/18/2014 1522   GFRAA >90 08/18/2014 1522     Baseline Immunosuppressant Therapy Labs TB GOLD   Hepatitis Panel    Latest Ref Rng & Units 09/26/2014   11:57 AM  Hepatitis  Hep B Surface Ag NEGATIVE NEGATIVE    HIV Lab Results  Component Value Date   HIV NONREACTIVE 01/30/2015   HIV NONREACTIVE 09/26/2014   Immunoglobulins   SPEP    Latest Ref Rng & Units 04/16/2023    2:13 PM  Serum Protein Electrophoresis  Total Protein 6.0 - 8.5 g/dL 6.1     Assessment/Plan:  Counseled patient that Cristy Folks 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  Counseled patient on purpose, proper use, and  adverse effects of Skyrizi.  Reviewed the most common adverse effects including infection, URTIs, headache, fatigue, tinea infections, and injection site. Counseled patient that Cristy Folks should be held for infection and prior to scheduled surgery.  Recommend annual influenza, PCV 15 or PCV20 or Pneumovax 23, and Shingrix as indicated. Advised that live vaccines should be avoided while taking Skyrizi.  Reviewed the importance of regular labs while on Skyrizi therapy.  Will monitor CBC and CMP 1 month after starting and then every 3 months routinely thereafter. Will monitor TB gold annually. Standing orders placed.  Provided patient with medication education material and answered all questions.  Patient voiced understanding.  Patient consented to [place].  Will upload consent into the media tab.  Reviewed storage instructions of Skyrizi.  Advised initial injection must be administered in office.  Patient verbalized understanding.  Will apply for Skyrizi through [employer] and update when we receive a response.  Dose will be Skyrizi 150 mg subcuteanously at week 0, week 4 then every 12 weeks thereafter.  Patient is uninsured. Patient assistance application completed today and patient will fax proof of income (retained in [place])  Chesley Mires, PharmD, MPH, BCPS, CPP Clinical Pharmacist (Rheumatology and Pulmonology)

## 2023-09-03 NOTE — Patient Instructions (Signed)
 Risankizumab Injection What is this medication? RISANKIZUMAB (RIS an KIZ ue mab) treats autoimmune conditions, such as psoriasis, arthritis, Crohn disease, and ulcerative colitis. It works by slowing down an overactive immune system.  It is a monoclonal antibody. This medicine may be used for other purposes; ask your health care provider or pharmacist if you have questions. COMMON BRAND NAME(S): Skyrizi What should I tell my care team before I take this medication? They need to know if you have any of these conditions: Hepatic disease Immune system problems Infection, such as tuberculosis (TB), bacterial, fungal or viral infections Recent or upcoming vaccine An unusual or allergic reaction to risankizumab, other medications, foods, dyes, or preservatives Pregnant or trying to get pregnant Breast-feeding How should I use this medication? This medication is injected into a vein or under the skin. It is given by your care team in a hospital or clinic setting. It may also be given at home. If you get this medication at home, you will be taught how to prepare and give it. Use exactly as directed. Take it as directed on the prescription label. Keep taking it unless your care team tells you to stop. If you use a pen, be sure to take off the outer needle cover before using the dose. It is important that you put your used needles and syringes in a special sharps container. Do not put them in a trash can. If you do not have a sharps container, call your pharmacist or care team to get one. A special MedGuide will be given to you by the pharmacist with each prescription and refill. Be sure to read this information carefully each time. This medication comes with INSTRUCTIONS FOR USE. Ask your pharmacist for directions on how to use this medication. Read the information carefully. Talk to your pharmacist or care team if you have questions. Talk to your care team about the use of this medication in children.  Special care may be needed. Overdosage: If you think you have taken too much of this medicine contact a poison control center or emergency room at once. NOTE: This medicine is only for you. Do not share this medicine with others. What if I miss a dose? It is important not to miss any doses. Talk to your care team about what to do if you miss a dose. What may interact with this medication? Do not take this medication with any of the following: Live vaccines This list may not describe all possible interactions. Give your health care provider a list of all the medicines, herbs, non-prescription drugs, or dietary supplements you use. Also tell them if you smoke, drink alcohol, or use illegal drugs. Some items may interact with your medicine. What should I watch for while using this medication? Visit your care team for regular checks on your progress. Tell your care team if your symptoms do not start to get better or if they get worse. You will be tested for tuberculosis (TB) before you start this medication. If your care team prescribes any medication for TB, you should start taking the TB medication before starting this medication. Make sure to finish the full course of TB medication. This medication may increase your risk of getting an infection. Call your care team for advice if you get a fever, chills, sore throat, or other symptoms of a cold or flu. Do not treat yourself. Try to avoid being around people who are sick. This medication can decrease the response to a vaccine. If you  need to get vaccinated, tell your care team if you have received this medication. Extra booster doses may be needed. Talk to your care team to see if a different vaccination schedule is needed. What side effects may I notice from receiving this medication? Side effects that you should report to your care team as soon as possible: Allergic reactions--skin rash, itching, hives, swelling of the face, lips, tongue, or  throat Infection--fever, chills, cough, sore throat, wounds that don't heal, pain or trouble when passing urine, general feeling of discomfort or being unwell Liver injury--right upper belly pain, loss of appetite, nausea, light-colored stool, dark yellow or brown urine, yellowing skin or eyes, unusual weakness or fatigue Side effects that usually do not require medical attention (report to your care team if they continue or are bothersome): Fatigue Headache Pain, redness, or irritation at injection site Runny or stuffy nose Sore throat This list may not describe all possible side effects. Call your doctor for medical advice about side effects. You may report side effects to FDA at 1-800-FDA-1088. Where should I keep my medication? Keep out of the reach of children and pets. Store in a refrigerator. Do not freeze. Protect from light. Keep it in the original carton until you are ready to take it. See product for storage information. Each product may have different instructions. Remove the dose from the carton about 30 to 45 minutes before it is time for you to take it. Get rid of any unused medication after the expiration date. To get rid of medications that are no longer needed or have expired: Take the medication to a medication take-back program. Check with your pharmacy or law enforcement to find a location. If you cannot return the medication, ask your pharmacist or care team how to get rid of this medication safely. NOTE: This sheet is a summary. It may not cover all possible information. If you have questions about this medicine, talk to your doctor, pharmacist, or health care provider.  2024 Elsevier/Gold Standard (2023-04-18 00:00:00)

## 2023-09-03 NOTE — Telephone Encounter (Signed)
   Will await reception of income documents.

## 2023-09-03 NOTE — Progress Notes (Signed)
 Calcium is low.  Patient should take calcium supplement total 1200 mg a day.  CBC is normal.

## 2023-09-03 NOTE — Telephone Encounter (Signed)
 Received income documents via Onbase  Submitted Patient Assistance Application to [employer] for SKYRIZI SQ along with provider portion, patient portion, medication list,and income documents. Will update patient when we receive a response.  Phone: [phone] Fax: [phone]

## 2023-09-04 ENCOUNTER — Other Ambulatory Visit: Payer: Self-pay | Admitting: Sports Medicine

## 2023-09-04 DIAGNOSIS — L4050 Arthropathic psoriasis, unspecified: Secondary | ICD-10-CM

## 2023-09-04 LAB — MISC LABCORP TEST (SEND OUT)
Labcorp test code: 16881
Labcorp test code: 6510

## 2023-09-04 LAB — PROTEIN ELECTROPHORESIS, SERUM, WITH REFLEX
A/G Ratio: 1.5 (ref 0.7–1.7)
Albumin ELP: 3.8 g/dL (ref 2.9–4.4)
Alpha-1-Globulin: 0.2 g/dL (ref 0.0–0.4)
Alpha-2-Globulin: 0.7 g/dL (ref 0.4–1.0)
Beta Globulin: 1.1 g/dL (ref 0.7–1.3)
Gamma Globulin: 0.7 g/dL (ref 0.4–1.8)
Globulin, Total: 2.6 g/dL (ref 2.2–3.9)
Total Protein ELP: 6.4 g/dL (ref 6.0–8.5)

## 2023-09-04 LAB — IGG, IGA, IGM
IgA: 164 mg/dL (ref 87–352)
IgG (Immunoglobin G), Serum: 706 mg/dL (ref 586–1602)
IgM (Immunoglobulin M), Srm: 106 mg/dL (ref 26–217)

## 2023-09-04 MED ORDER — OTEZLA 30 MG PO TABS: 30.0000 mg | ORAL_TABLET | Freq: Two times a day (BID) | ORAL | 11 refills | Status: DC

## 2023-09-05 LAB — MISC LABCORP TEST (SEND OUT): Labcorp test code: 144050

## 2023-09-07 LAB — QUANTIFERON-TB GOLD PLUS (RQFGPL)
QuantiFERON Mitogen Value: >10 [IU]/mL
QuantiFERON Nil Value: 0.13 [IU]/mL
QuantiFERON TB1 Ag Value: 0.13 [IU]/mL
QuantiFERON TB2 Ag Value: 0.13 [IU]/mL

## 2023-09-07 LAB — QUANTIFERON-TB GOLD PLUS: QuantiFERON-TB Gold Plus: NEGATIVE

## 2023-09-09 ENCOUNTER — Telehealth: Payer: Self-pay | Admitting: Physician Assistant

## 2023-09-09 NOTE — Progress Notes (Signed)
 TB Gold negative, immunoglobulins normal, SPEP normal,

## 2023-09-09 NOTE — Telephone Encounter (Signed)
 Patient returned your call.

## 2023-09-10 ENCOUNTER — Telehealth: Payer: Self-pay

## 2023-09-10 NOTE — Telephone Encounter (Unsigned)
 Copied from CRM [id number]. Topic: General - Other >> Sep 10, 2023  3:59 PM Lenon Radar A wrote: Reason for CRM: Patient called in returning phone call from [facility] from yesterday. Please contact patient.

## 2023-09-11 NOTE — Telephone Encounter (Signed)
 Was able to contact the patient and advise of result notes. Patient verbally acknowledged understanding of the results.

## 2023-09-16 NOTE — Telephone Encounter (Signed)
 Received a fax from  [employer] regarding an approval for SKYRIZI SQ patient assistance from 09/16/23 to 09/15/24. Approval letter sent to scan center.  Phone: [phone] Fax: [phone]  Patient scheduled for Skyrizi new start on 09/18/2023. Will use sample  Omnia Dollinger, PharmD, MPH, BCPS, CPP Clinical Pharmacist (Rheumatology and Pulmonology)

## 2023-09-17 ENCOUNTER — Other Ambulatory Visit (INDEPENDENT_AMBULATORY_CARE_PROVIDER_SITE_OTHER): Payer: Self-pay

## 2023-09-17 ENCOUNTER — Ambulatory Visit (INDEPENDENT_AMBULATORY_CARE_PROVIDER_SITE_OTHER): Payer: Self-pay | Admitting: Sports Medicine

## 2023-09-17 DIAGNOSIS — M7531 Calcific tendinitis of right shoulder: Secondary | ICD-10-CM

## 2023-09-17 DIAGNOSIS — M7532 Calcific tendinitis of left shoulder: Secondary | ICD-10-CM

## 2023-09-17 MED ORDER — TRIAMCINOLONE ACETONIDE 40 MG/ML IJ SUSP
40.0000 mg | Freq: Once | INTRAMUSCULAR | Status: AC
  Administered 2023-09-17: 40 mg via INTRAMUSCULAR

## 2023-09-17 NOTE — Patient Instructions (Signed)
 Your next SKYRIZI SQ dose is due on 10/16/23 then every 12 weeks thereafter (starting on 01/08/24)  HOLD SKYRIZI SQ if you have signs or symptoms of an infection. You can resume once you feel better or back to your baseline. HOLD SKYRIZI SQ if you start antibiotics to treat an infection. HOLD SKYRIZI SQ around the time of surgery/procedures. Your surgeon will be able to provide recommendations on when to hold BEFORE and when you are cleared to RESUME.  Pharmacy information: Your prescription will be shipped from [facility]. Their phone number is [phone] Please call to schedule shipment and confirm address. They will mail your medication to your home.  Labs are due in 1 month then every 3 months. Lab hours are from Monday to Thursday 8am-12:30pm and 1pm-5pm and Friday 8am-12pm. You do not need an appointment if you come for labs during these times. If you'd like to go to a [facility] or [facility] closer to home, please call our clinic 48 hours prior to lab date so we can release orders in a timely manner.  Stay up to date on all routine vaccines: influenza, pneumonia, COVID19, Shingles  How to manage an injection site reaction: Remember the 5 C's: COUNTER - leave on the counter at least 30 minutes but up to overnight to bring medication to room temperature. This may help prevent stinging COLD - place something cold (like an ice gel pack or cold water bottle) on the injection site just before cleansing with alcohol. This may help reduce pain CLARITIN - use Claritin (generic name is loratadine) for the first two weeks of treatment or the day of, the day before, and the day after injecting. This will help to minimize injection site reactions CORTISONE CREAM - apply if injection site is irritated and itching CALL ME - if injection site reaction is bigger than the size of your fist, looks infected, blisters, or if you develop hives

## 2023-09-17 NOTE — Progress Notes (Signed)
 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  Prior therapy includes: Taltz (2022-2023) inefficacious. NSAIDs caused LFTs . Otezla  was initiated by PCP but pending approval through patient assistance  Patient running a fever or have signs/symptoms of infection? No  Patient currently on antibiotics for the treatment of infection? No  Patient have any upcoming invasive procedures/surgeries? No  Objective: CMP     Component Value Date/Time   NA 136 09/03/2023 1103   NA 140 04/16/2023 1413   K 3.9 09/03/2023 1103   CL 106 09/03/2023 1103   CO2 21 (L) 09/03/2023 1103   GLUCOSE 94 09/03/2023 1103   BUN 9 09/03/2023 1103   BUN 12 04/16/2023 1413   CREATININE 0.79 09/03/2023 1103   CREATININE 0.83 09/04/2022 1513   CALCIUM 8.8 (L) 09/03/2023 1103   PROT 6.3 (L) 09/03/2023 1103   PROT 6.1 04/16/2023 1413   ALBUMIN 3.8 09/03/2023 1103   ALBUMIN 4.2 04/16/2023 1413   AST 15 09/03/2023 1103   ALT 16 09/03/2023 1103   ALKPHOS 43 09/03/2023 1103   BILITOT 0.5 09/03/2023 1103   BILITOT 0.3 04/16/2023 1413   GFRNONAA >60 09/03/2023 1103   GFRAA >90 08/18/2014 1522   CBC    Component Value Date/Time   WBC 10.3 09/03/2023 1103   RBC 4.68 09/03/2023 1103   HGB 14.0 09/03/2023 1103   HGB 13.6 04/16/2023 1413   HCT 41.1 09/03/2023 1103   HCT 40.1 04/16/2023 1413   PLT 219 09/03/2023 1103   PLT 156 04/16/2023 1413   MCV 87.8 09/03/2023 1103   MCV 90 04/16/2023 1413   MCH 29.9 09/03/2023 1103   MCHC 34.1 09/03/2023 1103   RDW 13.6 09/03/2023 1103   RDW 12.8 04/16/2023 1413   LYMPHSABS 3.0 09/03/2023 1103   LYMPHSABS 2.5 04/16/2023 1413   MONOABS 0.7 09/03/2023 1103   EOSABS 0.1 09/03/2023 1103   EOSABS 0.1 04/16/2023 1413   BASOSABS 0.1 09/03/2023 1103   BASOSABS 0.1 04/16/2023 1413   Baseline Immunosuppressant Therapy Labs TB GOLD    Latest Ref Rng & Units 09/03/2023   11:03 AM  Quantiferon TB  Gold  Quantiferon TB Gold Plus Negative Negative    Hepatitis Panel    Latest Ref Rng & Units 09/26/2014   11:57 AM  Hepatitis  Hep B Surface Ag NEGATIVE NEGATIVE    HIV Lab Results  Component Value Date   HIV NONREACTIVE 01/30/2015   HIV NONREACTIVE 09/26/2014   Immunoglobulins    Latest Ref Rng & Units 09/03/2023   11:03 AM  Immunoglobulin Electrophoresis  IgG 586 - 1,602 mg/dL 161   IgM 26 - 096 mg/dL 045    SPEP    Latest Ref Rng & Units 09/03/2023   11:03 AM  Serum Protein Electrophoresis  Total Protein 6.5 - 8.1 g/dL 6.3   Albumin 2.9 - 4.4 g/dL 3.8   Alpha-1 0.0 - 0.4 g/dL 0.2   Alpha-2 0.4 - 1.0 g/dL 0.7   Beta Globulin 0.7 - 1.3 g/dL 1.1   Gamma Globulin 0.4 - 1.8 g/dL 0.7    Assessment/Plan:  Reviewed importance of holding Skyrizi  with signs/symptoms of an infections, if antibiotics are prescribed to treat an active infection, and with invasive procedures  Demonstrated proper injection technique with Skyrizi  demo device  Patient able to demonstrate proper injection technique using the teach back method.  Patient self injected in the right upper thigh with:  Sample Medication: Skyrizi  150mg /mL pen injector NDC: [address]-78 Lot: [id number] Expiration: 08/2024  Patient tolerated well.  Observed for 30 mins in office for adverse reaction. Patient denies itchiness and irritation at injection., No swelling or redness noted., and Reviewed injection site reaction management with patient verbally and printed information for review in AVS  Patient is to return in 1 month for labs and 6-8 weeks for follow-up appointment.  Standing orders for CBC/CMP placed.  TB gold will be monitored yearly.  Skyrizi  SQ approved through patient assistance .   Rx sent to: [facility] for Humira/Rinvoq/Skyrizi : [phone].  Patient provided with pharmacy phone number and advised to call to schedule shipment to home.  Patient will continue Skyrizi  150mg  subcut at Week 0 (administered in  clinic today), Week 4, then every 12 weeks as monotherapy  All questions encouraged and answered.  Instructed patient to call with any further questions or concerns.  Geraldene Kleine, PharmD, MPH, BCPS, CPP Clinical Pharmacist (Rheumatology and Pulmonology)  09/17/2023 12:42 PM

## 2023-09-17 NOTE — Progress Notes (Signed)
    Procedures performed today:    Procedure: Real-time Ultrasound Guided injection of the left acromioclavicular joint Device: Samsung HS60  Verbal informed consent obtained.  Time-out conducted.  Noted no overlying erythema, induration, or other signs of local infection.  Skin prepped in a sterile fashion.  Local anesthesia: Topical Ethyl chloride.  With sterile technique and under real time ultrasound guidance: Noted effusion, 1 cc kenalog  40, 0.5 cc lidocaine  injected easily.   Completed without difficulty  Advised to call if fevers/chills, erythema, induration, drainage, or persistent bleeding.  Images permanently stored and available for review in PACS.  Impression: Technically successful ultrasound guided injection.  Independent interpretation of notes and tests performed by another provider:   None.  Brief History, Exam, Impression, and Recommendations:    Right shoulder calcific tendinopathy, left shoulder bicipital tendinopathy Very pleasant 43 year old female, known history of right shoulder calcific tendinopathy that required barbotage x 3 but then resolved. More recently she developed pain in the left shoulder, initially symptoms were more bicipital, biceps injection did not give relief, MRI did show edema in the subcoracoid space in the acromioclavicular joint. Subcoracoid injection also did not provide relief, today were doing an injection into the acromioclavicular joint, she is also going to be starting a new autoimmune biologic. Return to see me 4 to 6 weeks as needed.    ____________________________________________ Joselyn Nicely. Sandy Crumb, M.D., ABFM., CAQSM., AME. Primary Care and Sports Medicine [facility]  Adjunct Professor of [facility] Medicine  [employer]  Restaurant manager, fast food

## 2023-09-17 NOTE — Assessment & Plan Note (Addendum)
 Very pleasant 43 year old female, known history of right shoulder calcific tendinopathy that required barbotage x 3 but then resolved. More recently she developed pain in the left shoulder, initially symptoms were more bicipital, biceps injection did not give relief, MRI did show edema in the subcoracoid space in the acromioclavicular joint. Subcoracoid injection also did not provide relief, today were doing an injection into the acromioclavicular joint, she is also going to be starting a new autoimmune biologic. Return to see me 4 to 6 weeks as needed.

## 2023-09-17 NOTE — Addendum Note (Signed)
 Addended by: Montgomery Apgar on: 09/17/2023 01:06 PM   Modules accepted: Orders

## 2023-09-18 ENCOUNTER — Ambulatory Visit: Payer: Self-pay | Attending: Rheumatology | Admitting: Pharmacist

## 2023-09-18 DIAGNOSIS — L4050 Arthropathic psoriasis, unspecified: Secondary | ICD-10-CM

## 2023-09-18 DIAGNOSIS — L409 Psoriasis, unspecified: Secondary | ICD-10-CM

## 2023-09-18 DIAGNOSIS — Z79899 Other long term (current) drug therapy: Secondary | ICD-10-CM

## 2023-09-18 DIAGNOSIS — Z7189 Other specified counseling: Secondary | ICD-10-CM

## 2023-09-18 MED ORDER — SKYRIZI PEN 150 MG/ML ~~LOC~~ SOAJ: SUBCUTANEOUS | 0 refills | Status: AC

## 2023-09-24 NOTE — Progress Notes (Deleted)
 Office Visit Note  Patient: Ellen Short             Date of Birth: [birth year]           MRN: [id number]             PCP: Kita Perish Referring: Kita Perish Visit Date: 10/08/2023 Occupation: @GUAROCC @  Subjective:  No chief complaint on file.   History of Present Illness: [name] is a 43 y.o. female ***     Activities of Daily Living:  Patient reports morning stiffness for *** {minute/hour:19697}.   Patient {ACTIONS;DENIES/REPORTS:21021675::"Denies"} nocturnal pain.  Difficulty dressing/grooming: {ACTIONS;DENIES/REPORTS:21021675::"Denies"} Difficulty climbing stairs: {ACTIONS;DENIES/REPORTS:21021675::"Denies"} Difficulty getting out of chair: {ACTIONS;DENIES/REPORTS:21021675::"Denies"} Difficulty using hands for taps, buttons, cutlery, and/or writing: {ACTIONS;DENIES/REPORTS:21021675::"Denies"}  No Rheumatology ROS completed.   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   Family history of rheumatoid arthritis 05/29/2020   Polyarthralgia 05/29/2020   Joint stiffness 05/29/2020   Vaginal itching 01/07/2020   History of attention deficit hyperactivity disorder (ADHD) 11/29/2019   Inattention 11/29/2019   Psoriasis 11/29/2019   Family history of blood clots 11/26/2019   Stress due to marital problems 01/19/2019   Panic disorder 01/19/2019   Menstrual migraine without status migrainosus, not intractable 11/02/2018   Migraine without aura and without status  migrainosus, not intractable 06/18/2018   Intertrigo 06/18/2018   External hemorrhoid 03/09/2018   PMDD (premenstrual dysphoric disorder) 12/03/2017   Dermatitis of external ear 12/03/2017   Right shoulder calcific tendinopathy, left shoulder bicipital tendinopathy 10/27/2017    Past Medical History:  Diagnosis Date   Complication of anesthesia    ITCHING   H/O varicella    Infection    UTI WITH PREGNANCY   Migraine    MIGRAINES   Yeast infection     Family History  Problem Relation Age of Onset   Rheum arthritis Father    Miscarriages / Stillbirths Maternal Grandmother    Parkinsonism Maternal Grandmother    Birth defects Son        HAS ONLY ONE KIDNEY   Past Surgical History:  Procedure Laterality Date   CESAREAN SECTION     c/s times 4    CESAREAN SECTION N/A 04/12/2015   Procedure: CESAREAN SECTION;  Surgeon: Granville Layer, MD;  Location: [facility];  Service: Obstetrics;  Laterality: N/A;   INTRAUTERINE DEVICE INSERTION     Paragard   TONSILLECTOMY  AGE 81   WISDOM TOOTH EXTRACTION  AGE 46   Social History   Social History Narrative   Not on file   Immunization History  Administered Date(s) Administered   Influenza,inj,Quad PF,6+ Mos 01/30/2015, 02/25/2018, 07/02/2021, 02/04/2022   Tdap 01/30/2015     Objective: Vital Signs: There were no vitals taken for this visit.   Physical Exam   Musculoskeletal Exam: ***  CDAI Exam: CDAI Score: -- Patient Global: --; Provider Global: -- Swollen: --; Tender: -- Joint Exam 10/08/2023   No joint exam has been documented for this visit   There is currently no information  documented on the homunculus. Go to the Rheumatology activity and complete the homunculus joint exam.  Investigation: No additional findings.  Imaging: US  LIMITED JOINT SPACE STRUCTURES UP LEFT Result Date: 09/18/2023 Procedure: Real-time Ultrasound Guided injection of the left acromioclavicular joint Device: Samsung HS60 Verbal informed consent  obtained. Time-out conducted. Noted no overlying erythema, induration, or other signs of local infection. Skin prepped in a sterile fashion. Local anesthesia: Topical Ethyl chloride. With sterile technique and under real time ultrasound guidance: Noted effusion, 1 cc kenalog  40, 0.5 cc lidocaine  injected easily.  Completed without difficulty Advised to call if fevers/chills, erythema, induration, drainage, or persistent bleeding. Images permanently stored and available for review in PACS. Impression: Technically successful ultrasound guided injection.   XR Pelvis 1-2 Views Result Date: 09/03/2023 No SI joint narrowing or erosive changes were noted.  Bilateral mild SI joint sclerosis was noted. 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  XR Foot 2 Views Left Result Date: 09/03/2023 No MCP, PIP or DIP narrowing was noted.  No intertarsal, tibiotalar or subtalar joint space narrowing was noted.  Inferior and posterior calcaneal spurs were noted.  No erosive changes were noted. Impression: Unremarkable x-rays of the foot except the calcaneal spurs.  XR Foot 2 Views Right Result Date: 09/03/2023 No MTP, PIP and DIP narrowing was noted.  No intertarsal, tibiotalar or subtalar joint space narrowing was noted.  Inferior and posterior calcaneal spurs were noted.  No erosive changes were noted. Impression: Unremarkable x-rays of the foot except for calcaneal spurs.  XR Hand 2 View Left Result Date: 09/03/2023 Mild PIP and DIP narrowing was noted.  No MCP, intercarpal or radiocarpal joint space narrowing was noted.  No erosive changes were noted. Impression: These findings suggestive of early osteoarthritic changes.  XR Hand 2 View Right Result Date: 09/03/2023 No CMC, MCP, PIP or DIP narrowing was noted.  No intercarpal radiocarpal joint space narrowing was noted.  No erosive changes were noted. Impression: Unremarkable x-rays of the hand.  US  LIMITED JOINT SPACE  STRUCTURES UP LEFT Result Date: 08/26/2023 Procedure: Real-time Ultrasound Guided injection of the left shoulder subcoracoid space and intra-articular Device: Samsung HS60 Verbal informed consent obtained. Time-out conducted. Noted no overlying erythema, induration, or other signs of local infection. Skin prepped in a sterile fashion. Local anesthesia: Topical Ethyl chloride. With sterile technique and under real time ultrasound guidance: Using a 22-gauge spinal needle I injected medication into the subcoracoid space just superficial to the subscapularis, I then proceeded through the subscapularis into the intra-articular space and placed additional medicine for a total of 1 cc Kenalog  40, 2 cc lidocaine , 2 cc bupivacaine . Completed without difficulty Advised to call if fevers/chills, erythema, induration, drainage, or persistent bleeding. Images permanently stored and available for review in PACS. Impression: Technically successful ultrasound guided injection.    Recent Labs: Lab Results  Component Value Date   WBC 10.3 09/03/2023   HGB 14.0 09/03/2023   PLT 219 09/03/2023   NA 136 09/03/2023   K 3.9 09/03/2023   CL 106 09/03/2023   CO2 21 (L) 09/03/2023   GLUCOSE 94 09/03/2023   BUN 9 09/03/2023   CREATININE 0.79 09/03/2023   BILITOT 0.5 09/03/2023   ALKPHOS 43 09/03/2023   AST 15 09/03/2023   ALT 16 09/03/2023   PROT 6.3 (L) 09/03/2023   ALBUMIN 3.8 09/03/2023   CALCIUM 8.8 (L) 09/03/2023   GFRAA >90 08/18/2014   QFTBGOLDPLUS Negative 09/03/2023   September 03, 2023 TB  Gold negative, immunoglobulins normal, SPEP unremarkable Speciality Comments: Skyrizi  started 09/18/23  Procedures:  No procedures performed Allergies: Sulfa antibiotics, Amoxicillin, Erythromycin, Penicillins, and Topamax  [topiramate ]   Assessment / Plan:     Visit Diagnoses: No diagnosis found.  Orders: No orders of the defined types were placed in this encounter.  No orders of the defined types were placed in  this encounter.   Face-to-face time spent with patient was *** minutes. Greater than 50% of time was spent in counseling and coordination of care.  Follow-Up Instructions: No follow-ups on file.   Nicholas Bari, MD  Note - This record has been created using Animal nutritionist.  Chart creation errors have been sought, but may not always  have been located. Such creation errors do not reflect on  the standard of medical care.

## 2023-09-29 ENCOUNTER — Other Ambulatory Visit: Payer: Self-pay | Admitting: Physician Assistant

## 2023-10-08 ENCOUNTER — Ambulatory Visit: Payer: Self-pay | Admitting: Rheumatology

## 2023-10-08 DIAGNOSIS — M79672 Pain in left foot: Secondary | ICD-10-CM

## 2023-10-08 DIAGNOSIS — F902 Attention-deficit hyperactivity disorder, combined type: Secondary | ICD-10-CM

## 2023-10-08 DIAGNOSIS — R7989 Other specified abnormal findings of blood chemistry: Secondary | ICD-10-CM

## 2023-10-08 DIAGNOSIS — Z79899 Other long term (current) drug therapy: Secondary | ICD-10-CM

## 2023-10-08 DIAGNOSIS — L409 Psoriasis, unspecified: Secondary | ICD-10-CM

## 2023-10-08 DIAGNOSIS — G43009 Migraine without aura, not intractable, without status migrainosus: Secondary | ICD-10-CM

## 2023-10-08 DIAGNOSIS — F410 Panic disorder [episodic paroxysmal anxiety] without agoraphobia: Secondary | ICD-10-CM

## 2023-10-08 DIAGNOSIS — S86811A Strain of other muscle(s) and tendon(s) at lower leg level, right leg, initial encounter: Secondary | ICD-10-CM

## 2023-10-08 DIAGNOSIS — M79641 Pain in right hand: Secondary | ICD-10-CM

## 2023-10-08 DIAGNOSIS — Z8261 Family history of arthritis: Secondary | ICD-10-CM

## 2023-10-08 DIAGNOSIS — L4050 Arthropathic psoriasis, unspecified: Secondary | ICD-10-CM

## 2023-10-08 DIAGNOSIS — E785 Hyperlipidemia, unspecified: Secondary | ICD-10-CM

## 2023-10-08 DIAGNOSIS — M722 Plantar fascial fibromatosis: Secondary | ICD-10-CM

## 2023-10-08 DIAGNOSIS — Z6841 Body Mass Index (BMI) 40.0 and over, adult: Secondary | ICD-10-CM

## 2023-10-08 DIAGNOSIS — K648 Other hemorrhoids: Secondary | ICD-10-CM

## 2023-10-08 DIAGNOSIS — M7531 Calcific tendinitis of right shoulder: Secondary | ICD-10-CM

## 2023-10-08 DIAGNOSIS — G8929 Other chronic pain: Secondary | ICD-10-CM

## 2023-10-23 ENCOUNTER — Encounter: Payer: Self-pay | Admitting: Physician Assistant

## 2023-10-23 DIAGNOSIS — F902 Attention-deficit hyperactivity disorder, combined type: Secondary | ICD-10-CM

## 2023-10-23 DIAGNOSIS — G43009 Migraine without aura, not intractable, without status migrainosus: Secondary | ICD-10-CM

## 2023-10-23 DIAGNOSIS — R41840 Attention and concentration deficit: Secondary | ICD-10-CM

## 2023-10-24 MED ORDER — AMPHETAMINE-DEXTROAMPHET ER 20 MG PO CP24: 20.0000 mg | ORAL_CAPSULE | ORAL | 0 refills | Status: DC

## 2023-10-27 MED ORDER — ELETRIPTAN HYDROBROMIDE 40 MG PO TABS: ORAL_TABLET | ORAL | 5 refills | Status: DC

## 2023-10-27 NOTE — Telephone Encounter (Signed)
 Requesting rx rf of eletriptan  40mg  tablet Last written 04/16/2023 Last OV  09/17/23 Dr.Thekkekandam 04/16/2023 video visit with Rodger Civil  Upcoming appt 06/11/225 Dr. Sandy Crumb

## 2023-10-27 NOTE — Addendum Note (Signed)
 Addended by: Dickie Found on: 10/27/2023 08:39 AM   Modules accepted: Orders

## 2023-10-29 ENCOUNTER — Ambulatory Visit: Payer: Self-pay | Admitting: Sports Medicine

## 2023-10-30 ENCOUNTER — Ambulatory Visit: Payer: Self-pay | Admitting: Sports Medicine

## 2023-10-30 ENCOUNTER — Telehealth: Payer: Self-pay | Admitting: Physician Assistant

## 2023-10-30 DIAGNOSIS — H1032 Unspecified acute conjunctivitis, left eye: Secondary | ICD-10-CM

## 2023-10-30 DIAGNOSIS — J069 Acute upper respiratory infection, unspecified: Secondary | ICD-10-CM

## 2023-10-30 MED ORDER — OFLOXACIN 0.3 % OP SOLN: 1.0000 [drp] | Freq: Four times a day (QID) | OPHTHALMIC | 0 refills | Status: AC

## 2023-10-30 NOTE — Progress Notes (Signed)
 E-Visit for [employer]   We are sorry that you are not feeling well.  Here is how we plan to help!  Based on what you have shared with me it looks like you have conjunctivitis.  Conjunctivitis is a common inflammatory or infectious condition of the eye that is often referred to as pink eye.  In most cases it is contagious (viral or bacterial). However, not all conjunctivitis requires antibiotics (ex. Allergic).  We have made appropriate suggestions for you based upon your presentation.  I have prescribed Oflaxacin 1-2 drops 4 times a day times 5 days   Pink eye can be highly contagious.  It is typically spread through direct contact with secretions, or contaminated objects or surfaces that one may have touched.  Strict handwashing is suggested with soap and water is urged.  If not available, use alcohol based had sanitizer.  Avoid unnecessary touching of the eye.  If you wear contact lenses, you will need to refrain from wearing them until you see no white discharge from the eye for at least 24 hours after being on medication.  You should see symptom improvement in 1-2 days after starting the medication regimen.  Call us  if symptoms are not improved in 1-2 days.  Home Care: Wash your hands often! Do not wear your contacts until you complete your treatment plan. Avoid sharing towels, bed linen, personal items with a person who has pink eye. See attention for anyone in your home with similar symptoms.  Get Help Right Away If: Your symptoms do not improve. You develop blurred or loss of vision. Your symptoms worsen (increased discharge, pain or redness)   E-Visit for Upper Respiratory Infection   We are sorry you are not feeling well.  Here is how we plan to help!  Based on what you have shared with me, it looks like you may have a viral upper respiratory infection.  Upper respiratory infections are caused by a large number of viruses; however, rhinovirus is the most common cause.    Symptoms vary from person to person, with common symptoms including sore throat, cough, fatigue or lack of energy and feeling of general discomfort.  A low-grade fever of up to 100.4 may present, but is often uncommon.  Symptoms vary however, and are closely related to a person's age or underlying illnesses.  The most common symptoms associated with an upper respiratory infection are nasal discharge or congestion, cough, sneezing, headache and pressure in the ears and face.  These symptoms usually persist for about 3 to 10 days, but can last up to 2 weeks.  It is important to know that upper respiratory infections do not cause serious illness or complications in most cases.    Upper respiratory infections can be transmitted from person to person, with the most common method of transmission being a person's hands.  The virus is able to live on the skin and can infect other persons for up to 2 hours after direct contact.  Also, these can be transmitted when someone coughs or sneezes; thus, it is important to cover the mouth to reduce this risk.  To keep the spread of the illness at bay, good hand hygiene is very important.  This is an infection that is most likely caused by a virus. There are no specific treatments other than to help you with the symptoms until the infection runs its course.  We are sorry you are not feeling well.  Here is how we plan to help!  For nasal congestion, you may use an oral decongestants such as Mucinex D or if you have glaucoma or high blood pressure use plain Mucinex.  Saline nasal spray or nasal drops can help and can safely be used as often as needed for congestion.    If you do not have a history of heart disease, hypertension, diabetes or thyroid disease, prostate/bladder issues or glaucoma, you may also use Sudafed to treat nasal congestion.  It is highly recommended that you consult with a pharmacist or your primary care physician to ensure this medication is safe for  you to take.     If you have a cough, you may use cough suppressants such as Delsym and Robitussin.  If you have glaucoma or high blood pressure, you can also use Coricidin HBP.    If you have a sore or scratchy throat, use a saltwater gargle-  to  teaspoon of salt dissolved in a 4-ounce to 8-ounce glass of warm water.  Gargle the solution for approximately 15-30 seconds and then spit.  It is important not to swallow the solution.  You can also use throat lozenges/cough drops and Chloraseptic spray to help with throat pain or discomfort.  Warm or cold liquids can also be helpful in relieving throat pain.  For headache, pain or general discomfort, you can use Ibuprofen  or Tylenol  as directed.   Some authorities believe that zinc sprays or the use of Echinacea may shorten the course of your symptoms.   HOME CARE Only take medications as instructed by your medical team. Be sure to drink plenty of fluids. Water is fine as well as fruit juices, sodas and electrolyte beverages. You may want to stay away from caffeine  or alcohol. If you are nauseated, try taking small sips of liquids. How do you know if you are getting enough fluid? Your urine should be a pale yellow or almost colorless. Get rest. Taking a steamy shower or using a humidifier may help nasal congestion and ease sore throat pain. You can place a towel over your head and breathe in the steam from hot water coming from a faucet. Using a saline nasal spray works much the same way. Cough drops, hard candies and sore throat lozenges may ease your cough. Avoid close contacts especially the very young and the elderly Cover your mouth if you cough or sneeze Always remember to wash your hands.   GET HELP RIGHT AWAY IF: You develop worsening fever. If your symptoms do not improve within 10 days You develop yellow or green discharge from your nose over 3 days. You have coughing fits You develop a severe head ache or visual changes. You  develop shortness of breath, difficulty breathing or start having chest pain Your symptoms persist after you have completed your treatment plan  MAKE SURE YOU  Understand these instructions. Will watch your condition. Will get help right away if you are not doing well or get worse.  Thank you for choosing an e-visit.  Your e-visit answers were reviewed by a board certified advanced clinical practitioner to complete your personal care plan. Depending upon the condition, your plan could have included both over the counter or prescription medications.  Please review your pharmacy choice. Make sure the pharmacy is open so you can pick up prescription now. If there is a problem, you may contact your provider through [employer] and have the prescription routed to another pharmacy.  Your safety is important to us . If you have drug allergies check  your prescription carefully.   For the next 24 hours you can use MyChart to ask questions about today's visit, request a non-urgent call back, or ask for a work or school excuse. You will get an email in the next two days asking about your experience. I hope that your e-visit has been valuable and will speed your recovery.

## 2023-10-30 NOTE — Progress Notes (Signed)
 I have spent 5 minutes in review of e-visit questionnaire, review and updating patient chart, medical decision making and response to patient.   Piedad Climes, PA-C

## 2023-11-20 ENCOUNTER — Other Ambulatory Visit: Payer: Self-pay

## 2023-11-20 MED ORDER — GABAPENTIN 100 MG PO CAPS
ORAL_CAPSULE | ORAL | 0 refills | Status: DC
Start: 2023-11-20 — End: 2024-01-16

## 2023-12-30 ENCOUNTER — Ambulatory Visit: Payer: Self-pay | Admitting: Physician Assistant

## 2023-12-30 DIAGNOSIS — F902 Attention-deficit hyperactivity disorder, combined type: Secondary | ICD-10-CM

## 2023-12-30 DIAGNOSIS — R41840 Attention and concentration deficit: Secondary | ICD-10-CM

## 2024-01-14 ENCOUNTER — Ambulatory Visit: Payer: Self-pay | Admitting: Physician Assistant

## 2024-01-14 DIAGNOSIS — F902 Attention-deficit hyperactivity disorder, combined type: Secondary | ICD-10-CM

## 2024-01-16 ENCOUNTER — Telehealth (INDEPENDENT_AMBULATORY_CARE_PROVIDER_SITE_OTHER): Payer: Self-pay | Admitting: Physician Assistant

## 2024-01-16 ENCOUNTER — Encounter: Payer: Self-pay | Admitting: Physician Assistant

## 2024-01-16 DIAGNOSIS — M2550 Pain in unspecified joint: Secondary | ICD-10-CM

## 2024-01-16 DIAGNOSIS — F410 Panic disorder [episodic paroxysmal anxiety] without agoraphobia: Secondary | ICD-10-CM

## 2024-01-16 DIAGNOSIS — F902 Attention-deficit hyperactivity disorder, combined type: Secondary | ICD-10-CM

## 2024-01-16 DIAGNOSIS — G43009 Migraine without aura, not intractable, without status migrainosus: Secondary | ICD-10-CM

## 2024-01-16 DIAGNOSIS — F3281 Premenstrual dysphoric disorder: Secondary | ICD-10-CM

## 2024-01-16 DIAGNOSIS — G43901 Migraine, unspecified, not intractable, with status migrainosus: Secondary | ICD-10-CM

## 2024-01-16 DIAGNOSIS — L4050 Arthropathic psoriasis, unspecified: Secondary | ICD-10-CM

## 2024-01-16 DIAGNOSIS — R41840 Attention and concentration deficit: Secondary | ICD-10-CM

## 2024-01-16 MED ORDER — PROMETHAZINE HCL 12.5 MG PO TABS: ORAL_TABLET | ORAL | 1 refills | Status: AC

## 2024-01-16 MED ORDER — ELETRIPTAN HYDROBROMIDE 40 MG PO TABS
ORAL_TABLET | ORAL | 5 refills | Status: AC
Start: 2024-01-16 — End: ?

## 2024-01-16 MED ORDER — CITALOPRAM HYDROBROMIDE 20 MG PO TABS: 40.0000 mg | ORAL_TABLET | Freq: Every evening | ORAL | 3 refills | Status: AC

## 2024-01-16 MED ORDER — AMPHETAMINE-DEXTROAMPHET ER 20 MG PO CP24: 20.0000 mg | ORAL_CAPSULE | ORAL | 0 refills | Status: DC

## 2024-01-16 MED ORDER — GABAPENTIN 100 MG PO CAPS: ORAL_CAPSULE | ORAL | 1 refills | Status: AC

## 2024-01-16 NOTE — Progress Notes (Signed)
 ..Virtual Visit via Video Note  I connected with Ellen Short on 01/19/24 at 10:30 AM EDT by a video enabled telemedicine application and verified that I am speaking with the correct person using two identifiers.  Location: Patient: home Provider: clinic  .SABRAParticipating in visit:  Patient: Ellen Short Provider: Vermell Bologna PA-C   I discussed the limitations of evaluation and management by telemedicine and the availability of in person appointments. The patient expressed understanding and agreed to proceed.  History of Present Illness: Pt is a 43 yo female who calls into the clinic for refills.   ADHD/GAD- she has not had her adderall and has struggled with focus and getting work finished. She would like to have it back. She denies any problems or concerns with this medication. She is sleeping ok. Her anxiety and stress have been manageable but she is struggling some. Her mother is moving in with her this weekend. She has had a lot of to do items and stressors getting ready for that.   Her migraines are rescued with relpax . On averaged 3 to 4 migraine days a month. She does need refill of phenergan  to take with it.   Gabapentin  she takes just at night but continues to have a lot of pain during the day as well.       Observations/Objective: No acute distress  Normal mood and appearance Normal breathing   Assessment and Plan: .SABRATyniah was seen today for medical management of chronic issues.  Diagnoses and all orders for this visit:  Attention deficit hyperactivity disorder (ADHD), combined type -     amphetamine -dextroamphetamine (ADDERALL XR) 20 MG 24 hr capsule; Take 1 capsule (20 mg total) by mouth every morning. -     amphetamine -dextroamphetamine (ADDERALL XR) 20 MG 24 hr capsule; Take 1 capsule (20 mg total) by mouth every morning. -     amphetamine -dextroamphetamine (ADDERALL XR) 20 MG 24 hr capsule; Take 1 capsule (20 mg total) by mouth every morning.  Panic  disorder -     citalopram  (CELEXA ) 20 MG tablet; Take 2 tablets (40 mg total) by mouth every evening.  PMDD (premenstrual dysphoric disorder) -     citalopram  (CELEXA ) 20 MG tablet; Take 2 tablets (40 mg total) by mouth every evening.  Migraine without aura and without status migrainosus, not intractable -     eletriptan  (RELPAX ) 40 MG tablet; TAKE 1 TABLET BY MOUTH AT THE ONSET OF HEADACHE; MAY REPEAT IN 2 HOURS IF NEEDED. MAX OF 2 TABLETS IN 24 HOURS  Status migrainosus -     promethazine  (PHENERGAN ) 12.5 MG tablet; TAKE ONE TO TWO TABLETS BY MOUTH EVERY 8 HOURS AS NEEDED FOR NAUSEA AND VOMITING  Psoriatic arthritis (HCC) -     gabapentin  (NEURONTIN ) 100 MG capsule; Take one tablet three times a day.  Polyarthralgia -     gabapentin  (NEURONTIN ) 100 MG capsule; Take one tablet three times a day.   Refilled Adderall.  Refilled relpax  and phenergan  for migraines.  Increased gabapentin  to TID for pain.  Follow up in 3 months.    Follow Up Instructions:    I discussed the assessment and treatment plan with the patient. The patient was provided an opportunity to ask questions and all were answered. The patient agreed with the plan and demonstrated an understanding of the instructions.   The patient was advised to call back or seek an in-person evaluation if the symptoms worsen or if the condition fails to improve as anticipated.    Cianni Manny,  PA-C

## 2024-01-20 ENCOUNTER — Encounter: Payer: Self-pay | Admitting: Sports Medicine

## 2024-01-21 ENCOUNTER — Ambulatory Visit: Payer: Self-pay | Admitting: Physician Assistant

## 2024-02-24 ENCOUNTER — Other Ambulatory Visit: Payer: Self-pay | Admitting: Physician Assistant

## 2024-02-24 DIAGNOSIS — F410 Panic disorder [episodic paroxysmal anxiety] without agoraphobia: Secondary | ICD-10-CM

## 2024-02-25 NOTE — Telephone Encounter (Signed)
 CLONAZEPAM  Last OV: 01/16/24 Next OV: no appointment scheduled Last RF: 10/22/22

## 2024-02-26 ENCOUNTER — Encounter: Payer: Self-pay | Admitting: Physician Assistant

## 2024-02-26 ENCOUNTER — Ambulatory Visit: Payer: Self-pay | Admitting: Medical-Surgical

## 2024-02-26 DIAGNOSIS — F902 Attention-deficit hyperactivity disorder, combined type: Secondary | ICD-10-CM

## 2024-02-27 MED ORDER — AMPHETAMINE-DEXTROAMPHET ER 20 MG PO CP24: 20.0000 mg | ORAL_CAPSULE | ORAL | 0 refills | Status: DC

## 2024-02-27 MED ORDER — AMPHETAMINE-DEXTROAMPHET ER 20 MG PO CP24: 20.0000 mg | ORAL_CAPSULE | ORAL | 0 refills | Status: AC

## 2024-06-01 ENCOUNTER — Other Ambulatory Visit: Payer: Self-pay | Admitting: Physician Assistant

## 2024-06-01 ENCOUNTER — Encounter: Payer: Self-pay | Admitting: Physician Assistant

## 2024-06-01 DIAGNOSIS — F902 Attention-deficit hyperactivity disorder, combined type: Secondary | ICD-10-CM

## 2024-06-01 MED ORDER — AMPHETAMINE-DEXTROAMPHET ER 20 MG PO CP24: 20.0000 mg | ORAL_CAPSULE | ORAL | 0 refills | Status: AC

## 2024-06-01 NOTE — Telephone Encounter (Signed)
 Requesting a rx rf of Adderall XR 20mg   Last written 04/27/2024 Last OV 01/16/2024 telelmedicine Upcoming appt = none
